# Patient Record
Sex: Female | Born: 1970 | Race: Black or African American | Hispanic: No | Marital: Single | Smoking: Current every day smoker
Health system: Southern US, Community
[De-identification: ages and names within clinical notes are randomized; demographics above are authoritative.]

## PROBLEM LIST (undated history)

## (undated) DIAGNOSIS — M62838 Other muscle spasm: Secondary | ICD-10-CM

## (undated) DIAGNOSIS — R112 Nausea with vomiting, unspecified: Secondary | ICD-10-CM

## (undated) DIAGNOSIS — I10 Essential (primary) hypertension: Secondary | ICD-10-CM

## (undated) DIAGNOSIS — M549 Dorsalgia, unspecified: Secondary | ICD-10-CM

## (undated) DIAGNOSIS — G709 Myoneural disorder, unspecified: Secondary | ICD-10-CM

## (undated) DIAGNOSIS — T8859XA Other complications of anesthesia, initial encounter: Secondary | ICD-10-CM

## (undated) DIAGNOSIS — M069 Rheumatoid arthritis, unspecified: Secondary | ICD-10-CM

## (undated) DIAGNOSIS — Z9889 Other specified postprocedural states: Secondary | ICD-10-CM

## (undated) DIAGNOSIS — G8929 Other chronic pain: Secondary | ICD-10-CM

## (undated) DIAGNOSIS — T4145XA Adverse effect of unspecified anesthetic, initial encounter: Secondary | ICD-10-CM

## (undated) HISTORY — PX: BACK SURGERY: SHX140

## (undated) SURGERY — ARTHROSCOPY, KNEE
Anesthesia: Choice | Site: Knee | Laterality: Right

---

## 1993-05-07 HISTORY — PX: TUBAL LIGATION: SHX77

## 2000-11-14 ENCOUNTER — Other Ambulatory Visit: Admission: RE | Admit: 2000-11-14 | Discharge: 2000-11-14 | Payer: Self-pay | Admitting: Orthopedic Surgery

## 2001-01-30 ENCOUNTER — Encounter: Payer: Self-pay | Admitting: *Deleted

## 2001-01-30 ENCOUNTER — Emergency Department (HOSPITAL_COMMUNITY): Admission: EM | Admit: 2001-01-30 | Discharge: 2001-01-30 | Payer: Self-pay | Admitting: *Deleted

## 2001-07-29 ENCOUNTER — Emergency Department (HOSPITAL_COMMUNITY): Admission: EM | Admit: 2001-07-29 | Discharge: 2001-07-29 | Payer: Self-pay | Admitting: Emergency Medicine

## 2001-07-30 ENCOUNTER — Encounter: Payer: Self-pay | Admitting: Internal Medicine

## 2001-07-30 ENCOUNTER — Ambulatory Visit (HOSPITAL_COMMUNITY): Admission: RE | Admit: 2001-07-30 | Discharge: 2001-07-30 | Payer: Self-pay | Admitting: Internal Medicine

## 2002-01-26 ENCOUNTER — Emergency Department (HOSPITAL_COMMUNITY): Admission: EM | Admit: 2002-01-26 | Discharge: 2002-01-26 | Payer: Self-pay | Admitting: *Deleted

## 2002-02-04 ENCOUNTER — Ambulatory Visit (HOSPITAL_COMMUNITY): Admission: RE | Admit: 2002-02-04 | Discharge: 2002-02-04 | Payer: Self-pay | Admitting: Orthopaedic Surgery

## 2002-02-04 ENCOUNTER — Encounter: Payer: Self-pay | Admitting: Orthopaedic Surgery

## 2003-03-18 ENCOUNTER — Emergency Department (HOSPITAL_COMMUNITY): Admission: EM | Admit: 2003-03-18 | Discharge: 2003-03-18 | Payer: Self-pay | Admitting: *Deleted

## 2006-05-07 HISTORY — PX: OTHER SURGICAL HISTORY: SHX169

## 2006-05-18 ENCOUNTER — Inpatient Hospital Stay (HOSPITAL_COMMUNITY): Admission: EM | Admit: 2006-05-18 | Discharge: 2006-05-21 | Payer: Self-pay | Admitting: Emergency Medicine

## 2006-05-28 ENCOUNTER — Emergency Department (HOSPITAL_COMMUNITY): Admission: EM | Admit: 2006-05-28 | Discharge: 2006-05-28 | Payer: Self-pay | Admitting: Emergency Medicine

## 2006-05-29 ENCOUNTER — Ambulatory Visit (HOSPITAL_COMMUNITY): Admission: RE | Admit: 2006-05-29 | Discharge: 2006-05-29 | Payer: Self-pay | Admitting: Orthopedic Surgery

## 2006-10-15 ENCOUNTER — Encounter (HOSPITAL_COMMUNITY): Admission: RE | Admit: 2006-10-15 | Discharge: 2006-11-14 | Payer: Self-pay | Admitting: Orthopedic Surgery

## 2007-05-15 ENCOUNTER — Ambulatory Visit (HOSPITAL_COMMUNITY): Admission: RE | Admit: 2007-05-15 | Discharge: 2007-05-15 | Payer: Self-pay | Admitting: Internal Medicine

## 2007-10-19 ENCOUNTER — Emergency Department (HOSPITAL_COMMUNITY): Admission: EM | Admit: 2007-10-19 | Discharge: 2007-10-19 | Payer: Self-pay | Admitting: Emergency Medicine

## 2008-03-25 ENCOUNTER — Emergency Department (HOSPITAL_COMMUNITY): Admission: EM | Admit: 2008-03-25 | Discharge: 2008-03-25 | Payer: Self-pay | Admitting: Emergency Medicine

## 2008-06-02 ENCOUNTER — Ambulatory Visit (HOSPITAL_COMMUNITY): Admission: RE | Admit: 2008-06-02 | Discharge: 2008-06-02 | Payer: Self-pay | Admitting: Internal Medicine

## 2008-08-25 ENCOUNTER — Ambulatory Visit: Payer: Self-pay | Admitting: Orthopedic Surgery

## 2008-08-25 DIAGNOSIS — M25569 Pain in unspecified knee: Secondary | ICD-10-CM | POA: Insufficient documentation

## 2008-08-30 ENCOUNTER — Encounter: Payer: Self-pay | Admitting: Orthopedic Surgery

## 2008-09-08 ENCOUNTER — Encounter (HOSPITAL_COMMUNITY): Admission: RE | Admit: 2008-09-08 | Discharge: 2008-10-08 | Payer: Self-pay | Admitting: Orthopedic Surgery

## 2008-10-19 ENCOUNTER — Encounter: Payer: Self-pay | Admitting: Orthopedic Surgery

## 2008-11-13 ENCOUNTER — Emergency Department (HOSPITAL_COMMUNITY): Admission: EM | Admit: 2008-11-13 | Discharge: 2008-11-13 | Payer: Self-pay | Admitting: Emergency Medicine

## 2009-05-04 ENCOUNTER — Ambulatory Visit: Payer: Self-pay | Admitting: Orthopedic Surgery

## 2009-05-04 DIAGNOSIS — M24469 Recurrent dislocation, unspecified knee: Secondary | ICD-10-CM

## 2009-05-11 ENCOUNTER — Telehealth: Payer: Self-pay | Admitting: Orthopedic Surgery

## 2009-05-12 ENCOUNTER — Ambulatory Visit: Payer: Self-pay | Admitting: Orthopedic Surgery

## 2009-06-02 ENCOUNTER — Ambulatory Visit: Payer: Self-pay | Admitting: Orthopedic Surgery

## 2009-06-16 ENCOUNTER — Ambulatory Visit: Payer: Self-pay | Admitting: Orthopedic Surgery

## 2009-06-22 ENCOUNTER — Telehealth: Payer: Self-pay | Admitting: Orthopedic Surgery

## 2009-06-24 ENCOUNTER — Encounter: Payer: Self-pay | Admitting: Orthopedic Surgery

## 2009-06-24 ENCOUNTER — Ambulatory Visit (HOSPITAL_COMMUNITY): Admission: RE | Admit: 2009-06-24 | Discharge: 2009-06-24 | Payer: Self-pay | Admitting: Orthopedic Surgery

## 2009-06-29 ENCOUNTER — Ambulatory Visit: Payer: Self-pay | Admitting: Orthopedic Surgery

## 2009-06-30 ENCOUNTER — Telehealth: Payer: Self-pay | Admitting: Orthopedic Surgery

## 2009-07-12 ENCOUNTER — Telehealth: Payer: Self-pay | Admitting: Orthopedic Surgery

## 2009-07-20 ENCOUNTER — Encounter: Payer: Self-pay | Admitting: Orthopedic Surgery

## 2009-07-26 ENCOUNTER — Encounter: Payer: Self-pay | Admitting: Orthopedic Surgery

## 2009-08-13 ENCOUNTER — Encounter: Payer: Self-pay | Admitting: Orthopedic Surgery

## 2009-09-01 ENCOUNTER — Encounter: Payer: Self-pay | Admitting: Orthopedic Surgery

## 2009-09-19 ENCOUNTER — Emergency Department (HOSPITAL_COMMUNITY): Admission: EM | Admit: 2009-09-19 | Discharge: 2009-09-19 | Payer: Self-pay | Admitting: Emergency Medicine

## 2010-04-02 ENCOUNTER — Encounter: Payer: Self-pay | Admitting: Orthopedic Surgery

## 2010-04-02 ENCOUNTER — Emergency Department (HOSPITAL_COMMUNITY): Admission: EM | Admit: 2010-04-02 | Discharge: 2010-04-02 | Payer: Self-pay | Admitting: Emergency Medicine

## 2010-04-04 ENCOUNTER — Ambulatory Visit: Payer: Self-pay | Admitting: Orthopedic Surgery

## 2010-04-04 DIAGNOSIS — M2500 Hemarthrosis, unspecified joint: Secondary | ICD-10-CM | POA: Insufficient documentation

## 2010-04-05 ENCOUNTER — Ambulatory Visit (HOSPITAL_COMMUNITY): Admission: RE | Admit: 2010-04-05 | Discharge: 2010-04-05 | Payer: Self-pay | Admitting: Orthopedic Surgery

## 2010-04-11 ENCOUNTER — Ambulatory Visit: Payer: Self-pay | Admitting: Orthopedic Surgery

## 2010-05-07 HISTORY — PX: KNEE SURGERY: SHX244

## 2010-06-08 NOTE — Progress Notes (Signed)
Summary: spoke with patient about insurance information request  Phone Note Outgoing Call   Call placed to: Patient Summary of Call: Called patient re: request from [employer] re: short term disability forms.  Patient had been advised previously of forms process and fee. She relates that [employer] should accept the records from our office.  I advised that she must sign an authorization to release. Initial call taken by: Cammie Sickle,  July 12, 2009 11:58 AM

## 2010-06-08 NOTE — Letter (Signed)
Summary: Out of Work  [facility]  [address]  [place], [place] [address]   Phone: [phone]  Fax: [phone]    May 12, 2009   Employee:  Karen Mercer    To Whom It May Concern:   For Medical reasons, please excuse the above named employee from work for the following dates:  As of appointment today, May 12, 2009:  continue out of work status as follows:   Start:   May 04, 2009  End:   June 02, 2009  Approximate Return to work:   June 06, 2009  If you need additional information, please feel free to contact our office.         Sincerely,    Terrance Mass, MD

## 2010-06-08 NOTE — Assessment & Plan Note (Signed)
Summary: [facility] ER FOL/UP/RT KNEE SWELLING/XRAY [facility] 04/02/10/BCBS/CAF   Visit Type:  est. patient recurrent problem  CC:  right knee pain.  History of Present Illness: I saw Karen Mercer in the office today for a followup visit.  She is a 40 years old woman with the complaint of:  right knee pain and swelling.  The patient went to the emergency room secondary to increased pain and swelling on November 26 she denies any injury.  She was started on Norco 5 mg and Naprosyn 500 mg with good result.  She is here today for evaluation of the RIGHT knee which I saw her for in February of this year at which time she was treated for patellar subluxation.  She was placed in a brace and improved with that.  Xrays [facility] right knee for review from 04/02/10.  Was given Norco 5mg  number 20 and Naprosyn 500mg  number 20 from er, meds help.  Increased swelling since 04/01/10, no injury.  an MRI was done back in February and it showed arthritis of the knee medial compartment with thinning of the patellofemoral cartilage, moderate size knee effusion but no meniscal or ligament tear.  In terms of catching locking and giving MWN:UUVO noted       Allergies (verified): No Known Drug Allergies  Past History:  Past Medical History: Last updated: 08/25/2008 NA  Past Surgical History: Last updated: 08/25/2008 right arm tubal ligation  Risk Factors: Alcohol Use: 0 (08/25/2008) Caffeine Use: 0 (08/25/2008)  Risk Factors: Smoking Status: current (08/25/2008) Packs/Day: 1.0 (08/25/2008)  Family history reviewed for relevance to current acute and chronic problems.  Family History: Reviewed history from 08/25/2008 and no changes required. Family History of Arthritis   Knee Exam  General:    Well-developed, well-nourished, normal body habitus; no deformities, normal grooming.  Gait:    limp noted-right.    Skin:    Intact, no scars, lesions, rashes, cafe au lait spots, or bruising.     Inspection:    swelling: RIGHT knee joint effusion grade 3  Palpation:    medial retinacular tenderness along the patellofemoral articulation  Vascular:    There was no swelling or varicose veins. The pulses and temperature are normal. There was no edema or tenderness.  Sensory:    Gross coordination and sensation were normal.    Motor:    Motor strength 5/5 bilaterally for quadriceps, hamstrings, ankle dorsiflexion, and ankle plantar flexion.    Reflexes:    deferred  Knee Exam:    Right:    Inspection/Palpation:  range of motion RIGHT knee is 5-70  ACL PCL stable  Alignment normal  Mild apprehension    Left:    Inspection:  Normal    Palpation:  Normal    Stability:  stable    Tenderness:  negative    Swelling:  none    range of motion normal   Impression & Recommendations:  Problem # 1:  HEMARTHROSIS (ICD-719.10) Assessment New  an x-ray was done of the RIGHT knee on November 27 for pain and swelling moderate-sized suprapatellar joint effusion with normal joint spaces and mild cystic changes posterior patella and the superior region no acute fracture is seen.  Also reviewed previous MRIs stated which is noted in the history of present illness  Verbal consent was obtained. 50 cc blood drawn from the knee  The RIGHT knee was prepped with alcohol and ethyl chloride. 1 cc of depomedrol 40mg /cc and 4 cc of lidocaine 1% was injected. there  were no complications.  Orders: Est. Patient Level IV ([address]11) Joint Aspirate / Injection, Large (20610) Depo- Medrol 40mg  (J1030)   Recommend MRI RIGHT knee: Indications acute hemarthrosis unknown cause, history of patellofemoral arthritis and patellofemoral subluxation.  Fat droplets were also noted in the aspirate  Patient Instructions: 1)  You have received an injection of cortisone today. You may experience increased pain at the injection site. Apply ice pack to the area for 20 minutes every 2 hours and take 2 xtra  strength tylenol every 8 hours. This increased pain will usually resolve in 24 hours. The injection will take effect in 3-10 days.  2)  Apply three times a day for 20 min  3)  Wear brace  4)  Fill medications  5)  Stay OOW x 2 weeks  6)  MRI right knee, return after mri    Orders Added: 1)  Est. Patient Level IV [91478] 2)  Joint Aspirate / Injection, Large [20610] 3)  Depo- Medrol 40mg  [J1030]

## 2010-06-08 NOTE — Progress Notes (Signed)
Summary: request from [employer] Note Other Incoming Call back at [employer] of Call: Rec'd call from [employer], [place], re: patient's out of work status.  We have no auth on file.  Patient had been given our standard disability form packet.  Advised insurer that upon receiving auth and faxed request, we will forward information. Initial call taken by: Cammie Sickle,  June 30, 2009 9:56 AM

## 2010-06-08 NOTE — Letter (Signed)
Summary: Out of Work  [facility]  [address]  [place], [place] [address]   Phone: [phone]  Fax: [phone]    April 04, 2010   Employee:  Karen Mercer    To Whom It May Concern:   For Medical reasons, please excuse the above named employee from work for the following dates:  Start:   04/04/10  End:   04/18/10 Or until further notice   If you need additional information, please feel free to contact our office.         Sincerely,    Terrance Mass, MD

## 2010-06-08 NOTE — Progress Notes (Signed)
Summary: pain in the knee  Phone Note Call from Patient Call back at Home Phone [phone] Call back at [phone]   Summary of Call: patient states that she is having alot of knee pain, was seen 05/04/09, Norco 5 is not working and she still feels like her knee is subluxating with the stabilizer brace on,  appt scheduled to come in 3 weeks after her last office visit, Ibuprofen is being used also   any suggestions Initial call taken by: Ether Griffins,  May 11, 2009 11:00 AM  Follow-up for Phone Call        we can place her in a long leg cast   let me know if she agrees and I can schedule it at a time when I have time to do it  Follow-up by: [name] MD,  May 11, 2009 11:06 AM  Additional Follow-up for Phone Call Additional follow up Details #1::        I advised patient, she said that she is in pain and would like to get [facility] if this was the only option, and I advised her that we would not be giving her any stronger pain medication and she said she understood. So when would you like for her to come in Additional Follow-up by: Ether Griffins,  May 11, 2009 11:50 AM

## 2010-06-08 NOTE — Assessment & Plan Note (Signed)
Summary: RE-CK/REVIEW MRI RESULTS [facility]/RT KNEE/BCBS/CAF   Visit Type:  Follow-up  CC:  mri results rt knee.  History of Present Illness: I saw Karen Mercer in the office today for a followup visit.  She is a 40 years old woman with the complaint of:  right knee pain and swelling.  The patient went to the emergency room secondary to increased pain and swelling on November 26 she denies any injury.  She was started on Norco 5 mg and Naprosyn 500 mg with good result.  She is here today for evaluation of the RIGHT knee which I saw her for in February of this year at which time she was treated for patellar subluxation.  She was placed in a brace and improved with that.  Xrays [facility] right knee  04/02/10.  Medications Norco 5mg , and Naprosyn 500mg  , no refill needed.  Treatment were aspiration and injection rest ice and bracing.  Today we will review the new MRI and reevaluate her she is still having pain at a level of 7/10    I  Allergies: No Known Drug Allergies  Physical Exam  Additional Exam:  GENERAL: Appearance is normal   CDV: normal pulse and temperature   Neuro: sensation was normal   She is still favoring the RIGHT leg with a limp she has a joint effusion 2+.  Her range of motion is 125 her knee is stable her strength is normal     Impression & Recommendations:  Problem # 1:  HEMARTHROSIS (ICD-719.10) Assessment Improved  MPRESSION:   1.  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 2.  No acute bony findings and intact ligamentous structures. Proximal anterior MCL sprain is noted along with MCL and pes anserinus bursitis. 3.  No meniscal tears. 4.  Moderate sized joint effusion and medial patellar plica.  She still has a RIGHT knee effusions I reaspirated today it was amber colored up of 40 cc and we reinjected the knee.    Verbal consent was obtained. The RIGHT knee was prepped with alcohol and ethyl chloride. 1  cc of depomedrol 40mg /cc and 4 cc of lidocaine 1% was injected. there were no complications.  Orders: Est. Patient Level III ([address]) Joint Aspirate / Injection, Large (20610) Depo- Medrol 40mg  (J1030)  Patient Instructions: 1)  You have received an injection of cortisone today. You may experience increased pain at the injection site. Apply ice pack to the area for 20 minutes every 2 hours and take 2 xtra strength tylenol every 8 hours. This increased pain will usually resolve in 24 hours. The injection will take effect in 3-10 days.  2)  Ice 3 x a day  3)  wear brace as needed  4)  return to work in January  5)  continue pain medicine and naprosyn  6)  no f/u scheduled    Orders Added: 1)  Est. Patient Level III [60454] 2)  Joint Aspirate / Injection, Large [20610] 3)  Depo- Medrol 40mg  [J1030]

## 2010-06-08 NOTE — Letter (Signed)
Summary: [employer] disability insurance forms  [employer] disability insurance forms   Imported By: Jacklynn Ganong 08/23/2009 10:28:14  _____________________________________________________________________  External Attachment:    Type:   Image     Comment:   External Document

## 2010-06-08 NOTE — Letter (Signed)
Summary: Out of Work  [facility]  [address]  [place], [place] [address]   Phone: [phone]  Fax: [phone]    June 02, 2009   Employee:  Karen Mercer    To Whom It May Concern:   For Medical reasons, please excuse the above named employee from work for the following dates:  Start:   June 02, 2009  End:   June 20, 2009  (next appointment June 20, 2009)  Estimated Return to work date:  June 21, 2009   If you need additional information, please feel free to contact our office.         Sincerely,    Terrance Mass, MD

## 2010-06-08 NOTE — Assessment & Plan Note (Signed)
Summary: 3 WK RE-CK RT KNEE IN CAST/BCBS/CAF   Visit Type:  Follow-up  CC:  right knee pain.Karen Mercer  History of Present Illness: status post subluxation of patella with spontaneous relocation.  She was treated with a knee patellar stabilizer. It did not work so we placed a long-leg cast.  Reevaluation today.  Medication Vicodin. ibuprofen 800.  examination reveals that she hasStiffness in the RIGHT knee, apprehension, has not been alleviated. There is no excessive subluxation of the RIGHT knee.  She is neurovascularly intact. Her skin is dry. Her muscle tone is modestly decreased.  Impression subluxation, RIGHT patella.  Plan hinged knee brace, home exercise program. Return in 2 weeks reassess work status    Allergies: No Known Drug Allergies  Past History:  Past Medical History: Last updated: 08/25/2008 NA  Past Surgical History: Last updated: 08/25/2008 right arm tubal ligation  Family History: Last updated: 08/25/2008 Family History of Arthritis  Social History: Last updated: 08/25/2008 Patient is single.  machine operator  Risk Factors: Alcohol Use: 0 (08/25/2008) Caffeine Use: 0 (08/25/2008)  Risk Factors: Smoking Status: current (08/25/2008) Packs/Day: 1.0 (08/25/2008)  Review of Systems Neuro:  Complains of unsteady walking; denies numbness. MS:  See HPI; Complains of joint pain.   Other Orders: Est. Patient Level III ([address])  Patient Instructions: 1)  Brace 0-60  2)  Knee exercises daily  3)  return in 2 weeks  Prescriptions: NORCO 5-325 MG TABS (HYDROCODONE-ACETAMINOPHEN) 1 by mouth q 4 as needed  #42 x 2   Entered and Authorized by:   [name] MD   Signed by:   [name] MD on 06/02/2009   Method used:   Print then Give to Patient   RxID:   [id number]

## 2010-06-08 NOTE — Letter (Signed)
Summary: [employer] disability forms  [employer] disability forms   Imported By: Cammie Sickle 08/11/2009 19:05:53  _____________________________________________________________________  External Attachment:    Type:   Image     Comment:   External Document

## 2010-06-08 NOTE — Letter (Signed)
Summary: *Orthopedic No Show Letter  [facility]  [address]  [place], [place] [address]   Phone: [phone]  Fax: [phone]     09/01/2009    Ms. Zarie Kosiba [address] [place], [place]  [address]   Dear Ms. Ty Hilts,   Our records indicate that you missed your scheduled appointment with Dr. Beaulah Corin on 08/31/09.  Please contact this office to reschedule your appointment as soon as possible.  It is important that you keep your scheduled appointments with your physician, so we can provide you the best care possible.  We have enclosed an appointment card for your convenience.      Sincerely,    Dr. Terrance Mass, MD [facility] Phone [phone]

## 2010-06-08 NOTE — Miscellaneous (Signed)
Summary: MRI appointment information  Clinical Lists Changes   Contacted insurer [employer] re: pre-cert for MRI RT knee.  CPT [id number] - spoke w/Dalia and received pre-auth # [id number].  MRI appt scheduled 04/05/10 at [facility], registration 6:30pm for 7:00pm appt. Called patient and confirmed.  Follow up appointment has been scheduled here for results.

## 2010-06-08 NOTE — Medication Information (Signed)
Summary: RX Folder immobilizer  RX Folder immobilizer   Imported By: Cammie Sickle 08/27/2009 12:01:17  _____________________________________________________________________  External Attachment:    Type:   Image     Comment:   External Document

## 2010-06-08 NOTE — Letter (Signed)
Summary: Out of Work  [facility]  [address]  [place], [place] [address]   Phone: [phone]  Fax: [phone]    June 16, 2009   Employee:  Eli Phillips Hackel    To Whom It May Concern:   For Medical reasons, please excuse the above named employee from work for the following dates:  Start:   June 16, 2009 - patient returned to office today for appointment   Please continue out of work status as follows:  Estimated End/Estimated Return to work date:   August 04, 2009   If you need additional information, please feel free to contact our office.         Sincerely,    Terrance Mass, MD

## 2010-06-08 NOTE — Letter (Signed)
Summary: *Orthopedic No Show Letter  [facility]  [address]  [place], [place] [address]   Phone: [phone]  Fax: [phone]     08/13/2009    Karen Mercer [address] [place], [place]  [address]    Dear Ms. Ty Hilts,   Our records indicate that you missed your scheduled appointment with Dr. Beaulah Corin on 08/11/09.  Please contact this office to reschedule your appointment as soon as possible.  It is important that you keep your scheduled appointments with your physician, so we can provide you the best care possible.  We have enclosed an appointment card for your convenience.      Sincerely,    Dr. Terrance Mass, MD [facility] Phone [phone]

## 2010-06-08 NOTE — Progress Notes (Signed)
Summary: MRI appointment  Phone Note Outgoing Call   Call placed by: Waldon Reining,  June 22, 2009 3:48 PM Call placed to: Patient Action Taken: Phone Call Completed, Appt scheduled Summary of Call: I called to give the patient her MRI appointment at [facility] on 06-24-09 at 9:30am. Patient has [employer], authorization [id number] and it expires on 07-20-09. Patient will follow up back here for results.

## 2010-06-08 NOTE — Letter (Signed)
Summary: Out of Work  [facility]  [address]  [place], [place] [address]   Phone: [phone]  Fax: [phone]    April 11, 2010   Employee:  Eli Phillips Schauer    To Whom It May Concern:   For Medical reasons, please excuse the above named employee from work for the following dates:  Start:   04/04/10  End/Return to work full duty no restrictions:    04/13/10  If you need additional information, please feel free to contact our office.         Sincerely,   Terrance Mass, MD

## 2010-06-08 NOTE — Assessment & Plan Note (Signed)
Summary: KNEE PAIN/APPLY LONG LEG CAST/[employer]/CAF   Visit Type:  Follow-up  CC:  knee pain .  History of Present Illness: I saw Karen Mercer in the office today for a followup visit.  She is a 40 years old woman with the complaint of:  DX: Right knee pain.  Treatment: Right knee patelaar stabilizer brace and Norco 5 and Ibuprofen 800mg .  Complaints: a lot of pain right knee.  Today, scheduled for: [facility] for the right leg.   Allergies: No Known Drug Allergies   Other Orders: Long Leg Cast ([address]1478)  Patient Instructions: 1)  cast x 3 weeks then cast off resume brace

## 2010-06-08 NOTE — Assessment & Plan Note (Signed)
Summary: knee is swelling/frs   Visit Type:  Follow-up  CC:  right knee swelling.  History of Present Illness: I saw Karen Mercer in the office today for a followup visit.  She is a 40 years old woman with the complaint of:  DX: right knee subluxation patella with spontaneous relocation.  Treatment: [facility], lateral stabilizer brace, post op brace.  MEDS:  Norco 5 and Ibuprofen 800mg  once a day, soem relief.  Complaints:  swelling and pain for 3 days, no injury.  Still alternates braces.  Out of work, return on the 15th of this month.  Pain is medial and lateral.           Allergies (verified): No Known Drug Allergies  Physical Exam  Additional Exam:  the exam shows that she has tenderness pain and swelling with a large joint effusion in the knee.  The ACL and PCL are intact the collateral ligaments are stable.  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  Orders: Est. Patient Level III ([address]) Joint Aspirate / Injection, Large (20610) Depo- Medrol 40mg  (J1030)  Patient Instructions: 1)  MRI RIGHT KNEE  2)  OOW EXTEND 2/15-3/31 3)  You have received an injection of cortisone today. You may experience increased pain at the injection site. Apply ice pack to the area for 20 minutes every 2 hours and take 2 xtra strength tylenol every 8 hours. This increased pain will usually resolve in 24 hours. The injection will take effect in 3-10 days.

## 2010-06-08 NOTE — Letter (Signed)
Summary: Out of Work  [facility]  [address]  [place], [place] [address]   Phone: [phone]  Fax: [phone]    June 29, 2009   Employee:  Karen Mercer    To Whom It May Concern:   For Medical reasons, please excuse the above named employee from work for the following dates:  Start:   June 29, 2009 - seen in our office for appointment today  End/Return to full duty work, no restrictions:  July 04, 2009   If you need additional information, please feel free to contact our office.         Sincerely,    Terrance Mass, MD

## 2010-06-08 NOTE — Assessment & Plan Note (Signed)
Summary: mri results from ap/frs   Visit Type:  Follow-up  CC:  mri results rt knee.  History of Present Illness: I saw Karen Mercer in the office today for a followup visit.  She is a 40 years old woman with the complaint of:  DX:  Right knee pain.  Image:  MRI [facility] 06/24/09 right knee.  Reading: IMPRESSION:   1.  Mild medial compartment degenerative chondrosis. 2.  Thinning of the patellar cartilage at the apex. 3.  Moderate sized knee joint effusion. 4.  No evidence of meniscal or ligamentous tear.  No acute osteochondral findings.  I REVIEWED WITH THE REPORT AND AGREE   Norco 5 for pain/ response fairly adeqyate relief   EXAMINATION THE KNEE HAS A MILD EFFUSION FROM  NO TENDERNESS NO APPREHENSION  NORMAL GAIT  NEURO VASCULAR EXAM IS NORMAL         Problems Prior to Update: 1)  Subluxation Patellar (MALALIGNMENT)  (ICD-718.36) 2)  Patello-femoral Syndrome  (ICD-719.46) 3)  Family History of Arthritis  (ICD-V17.7)  Current Medications (verified): 1)  Norco 5-325 Mg Tabs (Hydrocodone-Acetaminophen) .Marland Kitchen.. 1 By Mouth Q 4 As Needed  Allergies (verified): No Known Drug Allergies  Past History:  Past medical, surgical, family and social histories (including risk factors) reviewed, and no changes noted (except as noted below).  Past Medical History: Reviewed history from 08/25/2008 and no changes required. NA  Past Surgical History: Reviewed history from 08/25/2008 and no changes required. right arm tubal ligation  Family History: Reviewed history from 08/25/2008 and no changes required. Family History of Arthritis  Social History: Reviewed history from 08/25/2008 and no changes required. Patient is single.  machine operator  Review of Systems MS:  See HPI.   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  Patient Level III ([address]) Est. Patient Level III ([address])  Patient Instructions: 1)  Go back into slide on brace 2)  Patellafemoral subluxation. 3)  RTW full duty MOnday 07/04/09 4)  Come back in 6 week Prescriptions: NORCO 5-325 MG TABS (HYDROCODONE-ACETAMINOPHEN) 1 by mouth q 4 as needed  #42 x 2   Entered and Authorized by:   [name] MD   Signed by:   [name] MD on 06/29/2009   Method used:   Print then Give to Patient   RxID:   [id number] NORCO 5-325 MG TABS (HYDROCODONE-ACETAMINOPHEN) 1 by mouth q 4 as needed  #42 x 2   Entered and Authorized by:   [name] MD   Signed by:   [name] MD on 06/29/2009   Method used:   Historical   RxID:   [id number]

## 2010-06-08 NOTE — Letter (Signed)
Summary: Medical record fax [employer]  Medical record fax [employer]   Imported By: Cammie Sickle 07/26/2009 18:51:12  _____________________________________________________________________  External Attachment:    Type:   Image     Comment:   External Document

## 2010-07-03 ENCOUNTER — Encounter: Payer: Self-pay | Admitting: Orthopedic Surgery

## 2010-07-13 NOTE — Letter (Signed)
Summary: Out of Work  [facility]  [address]  [place], [place] [address]   Phone: [phone]  Fax: [phone]    July 03, 2010   Employee:  Karen Mercer    To Whom It May Concern:   For Medical reasons, please excuse the above named employee from work for the following dates:  Start:   07/03/10  End:   07/04/10   May return to work 07/05/10  If you need additional information, please feel free to contact our office.         Sincerely,    Dr. Wyline Copas, Montez Hageman.

## 2010-07-27 ENCOUNTER — Emergency Department (HOSPITAL_COMMUNITY)
Admission: EM | Admit: 2010-07-27 | Discharge: 2010-07-27 | Disposition: A | Discharge status: Home / Self Care | Payer: BC Managed Care – PPO | Attending: Emergency Medicine | Admitting: Emergency Medicine

## 2010-07-27 DIAGNOSIS — K029 Dental caries, unspecified: Secondary | ICD-10-CM | POA: Insufficient documentation

## 2010-07-27 DIAGNOSIS — K089 Disorder of teeth and supporting structures, unspecified: Secondary | ICD-10-CM | POA: Insufficient documentation

## 2010-08-18 ENCOUNTER — Encounter: Payer: Self-pay | Admitting: Orthopedic Surgery

## 2010-09-14 ENCOUNTER — Encounter: Payer: Self-pay | Admitting: Orthopedic Surgery

## 2010-09-14 ENCOUNTER — Ambulatory Visit (INDEPENDENT_AMBULATORY_CARE_PROVIDER_SITE_OTHER): Payer: BC Managed Care – PPO | Admitting: Orthopedic Surgery

## 2010-09-14 DIAGNOSIS — M25369 Other instability, unspecified knee: Secondary | ICD-10-CM | POA: Insufficient documentation

## 2010-09-14 DIAGNOSIS — M2390 Unspecified internal derangement of unspecified knee: Secondary | ICD-10-CM

## 2010-09-14 DIAGNOSIS — M25869 Other specified joint disorders, unspecified knee: Secondary | ICD-10-CM

## 2010-09-14 NOTE — Progress Notes (Signed)
40 year old female with complaints of recurrent knee pain and swelling with subluxating patella. The patient has had multiple subluxations and has had an MRI in the past, which shows no acute meniscal injury or ligament injury, but she does have recurrent episodes of subluxation.  Medical history no major medical problems.  She did have surgery on her RIGHT arm and she had a tubal ligation.  She reports caffeine use and alcohol use on occasion.  She does smoke.  Her family history is notable for arthritis.  ALLERGIES none known  Review of systems negative  Exam well-developed well-nourished normal body habitus no deformities normal grooming.  She ambulates with a limp she favors her RIGHT leg Upper extremities are normal to inspection palpation with normal range of motion absent of joint subluxation normal strength normal skin normal pulses and temperature no lymphadenopathy normal sensation  Inspection reveals reasonable alignment in both lower extremities.  She has full range of motion.  She is subluxable patella with positive apprehension the collateral ligaments and cruciate ligaments are stable muscle strength and tone is normal the skin is intact the pulses are good temperature is normal there is no edema here it no lymphadenopathy.   Sensation is normal  Previous MRI was done there was mild degenerative chondrosis of the patellar apex and also in the medial compartment no meniscal tears were cruciate ligament tears were noted she does have a history of a MCL sprain  Diagnosis recurrent subluxation RIGHT patella, patellofemoral malalignment.  Plan for arthroscopic evaluation of the joint followed by a lateral release arthroscopically open medial reefing and open tibial tubercle transfer with internal fixation    She will be out of work for 3 months.  Risk-benefit ratio of surgical versus non-surgical treatment has been explained. The patient wished to proceed with surgery.
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
## 2010-10-11 ENCOUNTER — Telehealth: Payer: Self-pay | Admitting: Orthopedic Surgery

## 2010-10-11 NOTE — Telephone Encounter (Signed)
Contacted Ph [phone], 4:55pm re: surgery scheduled at [facility] 10/16/10, left message automated system. Also entered online pre-cert.

## 2010-10-12 ENCOUNTER — Encounter: Payer: Self-pay | Admitting: Orthopedic Surgery

## 2010-10-12 ENCOUNTER — Encounter (HOSPITAL_COMMUNITY): Payer: BC Managed Care – PPO

## 2010-10-12 LAB — HEMOGLOBIN AND HEMATOCRIT, BLOOD
HCT: 33.7 % — ABNORMAL LOW (ref 36.0–46.0)
Hemoglobin: 11.4 g/dL — ABNORMAL LOW (ref 12.0–15.0)

## 2010-10-12 LAB — BASIC METABOLIC PANEL
BUN: 12 mg/dL (ref 6–23)
CO2: 26 mEq/L (ref 19–32)
Calcium: 9.8 mg/dL (ref 8.4–10.5)
Sodium: 139 mEq/L (ref 135–145)

## 2010-10-12 LAB — SURGICAL PCR SCREEN: Staphylococcus aureus: NEGATIVE

## 2010-10-12 NOTE — Telephone Encounter (Signed)
AddendumRudean Haskell care management rep, Charlcie Cradle, and received REF# [id number]; faxed clinicals as advised to Care Mgmt Department: fax [phone].

## 2010-10-12 NOTE — Progress Notes (Signed)
Karen Mercer  Description:  40 year old female  09/14/2010 10:15 AM Office Visit Provider:  [name], MD  MRN: [id number] Department:  [facility]  Updated October 12, 2010          Diagnoses  Reason for Visit    Patellar malalignment syndrome - Primary  Joint Swelling   717.9  right knee pain and swelling, had MRI right knee 2011, had aspirations and injections in the past, last injection was 04/11/10, has a brace, was given Norco 5 and Naprosyn last year for this problem, no new inuries, pain level is 7 today, swelling for a month, c/o popping noise.   Patellar instability      719.86                 History & Physical     [name], MD   40 year old female with complaints of recurrent knee pain and swelling with subluxating patella. The patient has had multiple subluxations and has had an MRI in the past, which shows no acute meniscal injury or ligament injury, but she does have recurrent episodes of subluxation.   Medical history no major medical problems. She did have surgery on her RIGHT arm and she had a tubal ligation. She reports caffeine use and alcohol use on occasion. She does smoke. Her family history is notable for arthritis.   ALLERGIES none known   Review of systems negative   Exam well-developed well-nourished normal body habitus no deformities normal grooming. She ambulates with a limp she favors her RIGHT leg   Upper extremities are normal to inspection palpation with normal range of motion absent of joint subluxation normal strength normal skin normal pulses and temperature no lymphadenopathy normal sensation   Inspection reveals reasonable alignment in both lower extremities. She has full range of motion. She has subluxable patella with positive apprehension; the collateral ligaments and cruciate ligaments are stable muscle strength and tone is normal the skin is intact the pulses are good temperature is normal there is no edema here it  no lymphadenopathy.   Sensation is normal   Previous MRI was done there was mild degenerative chondrosis of the patellar apex and also in the medial compartment no meniscal tears were cruciate ligament tears were noted she does have a history of a MCL sprain   Diagnosis recurrent subluxation RIGHT patella, patellofemoral malalignment.   Plan for arthroscopic evaluation of the joint followed by a lateral release arthroscopically open medial reefing and open tibial tubercle transfer with internal fixation   She will be out of work for 3 months.   Risk-benefit ratio of surgical versus non-surgical treatment has been explained. The patient wished to proceed with surgery.

## 2010-10-13 ENCOUNTER — Telehealth: Payer: Self-pay | Admitting: Orthopedic Surgery

## 2010-10-13 NOTE — Telephone Encounter (Signed)
Human resources officer Tiffany returned call, ph [phone]; states request for pre-authorization approved for 48-hour observation stay re: 10/16/10 surgery scheduled at [facility]. She states if needed to be converted to in-patient after 48 hours, hospital is to notify them re: related clinical information. (Same Ref/notification # [id number])

## 2010-10-16 ENCOUNTER — Ambulatory Visit (HOSPITAL_COMMUNITY)
Admission: RE | Admit: 2010-10-16 | Discharge: 2010-10-16 | Disposition: A | Discharge status: Home / Self Care | Payer: BC Managed Care – PPO | Source: Ambulatory Visit | Attending: Orthopedic Surgery | Admitting: Orthopedic Surgery

## 2010-10-16 DIAGNOSIS — Z01812 Encounter for preprocedural laboratory examination: Secondary | ICD-10-CM | POA: Insufficient documentation

## 2010-10-16 DIAGNOSIS — M24469 Recurrent dislocation, unspecified knee: Secondary | ICD-10-CM | POA: Insufficient documentation

## 2010-10-16 DIAGNOSIS — Z01818 Encounter for other preprocedural examination: Secondary | ICD-10-CM | POA: Insufficient documentation

## 2010-10-17 NOTE — Telephone Encounter (Signed)
10/16/10 - Per call back from  [facility], nurse reviewer, Tiffany ,ph [phone];  states request for pre-authorization approved for 48-hour observation stay re: 10/16/10 surgery scheduled at [facility]. She states if needed to be converted to in-patient after 48 hours, hospital is to notify them re: related clinical information. (Same Ref/notification # [id number])  (see separate note)

## 2010-10-19 ENCOUNTER — Ambulatory Visit (INDEPENDENT_AMBULATORY_CARE_PROVIDER_SITE_OTHER): Payer: BC Managed Care – PPO | Admitting: Orthopedic Surgery

## 2010-10-19 DIAGNOSIS — M24469 Recurrent dislocation, unspecified knee: Secondary | ICD-10-CM

## 2010-10-19 DIAGNOSIS — M25569 Pain in unspecified knee: Secondary | ICD-10-CM

## 2010-10-19 DIAGNOSIS — M2201 Recurrent dislocation of patella, right knee: Secondary | ICD-10-CM | POA: Insufficient documentation

## 2010-10-19 MED ORDER — HYDROCODONE-ACETAMINOPHEN 10-325 MG PO TABS: 1.0000 | ORAL_TABLET | ORAL | Status: DC | PRN

## 2010-10-19 NOTE — Progress Notes (Signed)
Postoperative visit #1.  Postop day #3 Surgery date June 11  status post proximal patellar realignment with arthroscopic lateral release, arthroscopic medial reefing, chondroplasty, medial patella.  Incisions look good. She has a mild amount of swelling.  Recommend increased pain medication to 10 mg of Norco.  Start physical therapy.  Straight leg brace for 3 weeks full weightbearing.  Come back in 2 weeks for recheck.

## 2010-10-19 NOTE — Patient Instructions (Signed)
Start weightbearing on the leg  Go for Physical therapy  Ice knee as needed  Take pain meds as needed Ibuprofen every 8 hrs  Come back in 2 weeks

## 2010-11-02 ENCOUNTER — Ambulatory Visit (INDEPENDENT_AMBULATORY_CARE_PROVIDER_SITE_OTHER): Payer: BC Managed Care – PPO | Admitting: Orthopedic Surgery

## 2010-11-02 DIAGNOSIS — M2390 Unspecified internal derangement of unspecified knee: Secondary | ICD-10-CM

## 2010-11-02 NOTE — Progress Notes (Signed)
Lateral release and medial reefing RIGHT knee  Postoperative  Surgery postop day #17  Therapy at [facility] to be doing well mild knee effusion nothing more than expected  Continue immobilizer and therapy followup with me in 3 weeks

## 2010-11-20 NOTE — Op Note (Signed)
NAMELASHAWNE, DURA NO.:  [id number]  MEDICAL RECORD NO.:  [id number]  LOCATION:  DAYP                          FACILITY:  [facility]  PHYSICIAN:  [name], M.D.DATE OF BIRTH:  03-10-1971  DATE OF PROCEDURE:  10/16/2010 DATE OF DISCHARGE:                              OPERATIVE REPORT   HISTORY:  A 40 year old female with multiple subluxations and dislocation of the right patella.  History of ligament laxity presented with pain and recurrent subluxations and was counseled on treatment options and opted for surgical versus continued nonoperative treatment therapy and bracing.  PREOPERATIVE DIAGNOSIS:  Subluxating right patella  POSTOPERATIVE DIAGNOSIS:  Subluxating right patella.  PROCEDURE:  Arthroscopy right knee, chondroplasty medial facet of the patella, lateral release, and medial reefing.  Exam under anesthesia.  SURGEON:  [name], MD  ASSISTANT:  Pierson Nation  ANESTHESIA:  General.  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  There was grade 2 chondromalacia of the medial femoral condyle.  There was grade 1 chondromalacia of the trochlea.  There was mild lateral facet overhang/subluxation and laxity of the medial patellofemoral ligament secondary to ligament laxity.  After site marking in the preop area of the right knee, the chart was updated.  The patient was taken to the operating room for general anesthesia, and she was given 1 g of Ancef.  At that point, we did an exam under anesthesia the ACL and PCL were intact.  Collateral ligaments were stable.  There was no obvious subluxation on range of motion of the knee.  However, the patella was hypermobile in each direction.  The Q- angle was normal.  She had hyperextension of the knee of approximately 15 degrees under anesthesia.  The leg was then prepped and draped with sterile technique.  Time-out procedure was completed.  A lateral portal was  established.  The scope was placed into the joint. Diagnostic arthroscopy was performed, noted grade 2 medial femoral chondromalacia.  There was fraying of the root of the medial meniscus. There was normal lateral compartment including meniscus.  ACL and PCL were intact.  The scope was placed into the joint into the patellofemoral area and chondromalacia was noted of the medial facet grade 3.  With the combination of mild lateral facet overhangs slash subluxation and laxity of the medial patellofemoral ligament.  I decided at that point not to realign the tibial tubercle as this would shift stress onto the medial facet which had greater than grade 2 chondromalacia so at that point we took the knee through a range of motion and 0, 30, 60, and 90 degrees of flexion.  The patella engaged at 30 degrees of knee flexion stating gauge throughout the flexion arc.  We debrided the medial chondromalacia on the patella.  We debrided the abnormality of the root of the medial meniscus with an ArthroCare wand. The chondroplasty was performed with a motorized shaver.  We then performed a lateral release.  We then did an arthroscopically assisted medial reefing using PDS suture outside-in technique.  We then made an incision along the four sutures that were placed and tied the sutures, tightening up the medial structures.  We then put the scope back into the joint and a lateral overhang was corrected.  We took the knee back through a range of motion and the knee continued to track normally in terms of the patella.  We irrigated the joint and took the scope out of the joint and then closed the medial incision with 0 Monocryl, 2-0 Monocryl in a running subcuticular fashion and applied Steri-Strips.  We injected the joint with 45 mL of 0.5% Marcaine with epinephrine.  We used 3-0 nylon to close the portals.  There were no specimens.  No complications.  Blood loss was zero.  The patient went to PACU  in good condition.  The patient will be discharged home full weightbearing.  She will use cryotherapy.  When she comes to the office, we will do a dressing change and instructed on range of motion exercises.  I gave her a straight knee immobilizer to ambulate with.     [name], M.D.     SEH/MEDQ  D:  10/16/2010  T:  10/17/2010  Job:  [id number]  Electronically Signed by [name] M.D. on 11/20/2010 05:50:05 PM

## 2010-11-20 NOTE — Op Note (Signed)
  NAMEFEDORA, KNISELY NO.:  [id number]  MEDICAL RECORD NO.:  [id number]  LOCATION:  DAYP                          FACILITY:  [facility]  PHYSICIAN:  [name], M.D.DATE OF BIRTH:  05/03/1971  DATE OF PROCEDURE: DATE OF DISCHARGE:                              OPERATIVE REPORT   ADDENDUM  DISCHARGE MEDICATIONS: 1. Norco 10/325 one q.4 p.r.n. for pain #84 one refill. 2. Ibuprofen 800 mg q.8 h. #90 one refill.     [name], M.D.     SEH/MEDQ  D:  10/16/2010  T:  10/17/2010  Job:  [id number]  Electronically Signed by [name] M.D. on 11/20/2010 05:50:09 PM

## 2010-11-21 ENCOUNTER — Other Ambulatory Visit: Payer: Self-pay | Admitting: *Deleted

## 2010-11-21 DIAGNOSIS — M25569 Pain in unspecified knee: Secondary | ICD-10-CM

## 2010-11-21 MED ORDER — HYDROCODONE-ACETAMINOPHEN 7.5-325 MG PO TABS: 1.0000 | ORAL_TABLET | ORAL | Status: AC | PRN

## 2010-11-28 ENCOUNTER — Ambulatory Visit (INDEPENDENT_AMBULATORY_CARE_PROVIDER_SITE_OTHER): Payer: BC Managed Care – PPO | Admitting: Orthopedic Surgery

## 2010-11-28 DIAGNOSIS — M2540 Effusion, unspecified joint: Secondary | ICD-10-CM

## 2010-11-28 MED ORDER — METHYLPREDNISOLONE ACETATE 40 MG/ML IJ SUSP: 40.0000 mg | Freq: Once | INTRAMUSCULAR | Status: DC

## 2010-11-28 NOTE — Progress Notes (Signed)
  This is a six-week followup status post arthroscopy RIGHT knee arthroscopic medial reefing and lateral release  Seems to be doing well having good days and bad days but does have knee effusion, knee flexion limited to 100  Recommend aspiration and injection of knee.  That was completed  Then we placed her in a patellar stabilizer brace she is to continue therapy and come back in 6 weeks  Separate procedure  Aspiration injection RIGHT knee  Verbal consent was obtained   Time out completed     Under sterile conditions the right knee was First aspirated approximately 30 cc of clear yellow fluid then injected with Depomedrol 40 mg / ml (1 ml) and lidocaine 1% (4 ml)  There were no complications

## 2010-12-18 ENCOUNTER — Encounter: Payer: Self-pay | Admitting: Orthopedic Surgery

## 2010-12-18 ENCOUNTER — Telehealth: Payer: Self-pay | Admitting: Orthopedic Surgery

## 2010-12-18 NOTE — Telephone Encounter (Signed)
YES WE CAN   SHE SHOULD WEAR A KNEE SLEEVE   RELEASE APPROVED

## 2010-12-18 NOTE — Telephone Encounter (Signed)
Called the patient to pick up note. °

## 2010-12-18 NOTE — Telephone Encounter (Signed)
Karen Mercer is not scheduled to come back to see you until 01/09/11, but she says she is ready to go back to work now. She is asking if you can release her to go back with restriction of "No Climbing" before she comes for that appointment.  If not, can we move that appointment up sooner? Please advise

## 2011-01-05 ENCOUNTER — Other Ambulatory Visit: Payer: Self-pay | Admitting: *Deleted

## 2011-01-05 MED ORDER — HYDROCODONE-ACETAMINOPHEN 5-325 MG PO TABS: 1.0000 | ORAL_TABLET | ORAL | Status: DC | PRN

## 2011-01-09 ENCOUNTER — Encounter: Payer: Self-pay | Admitting: Orthopedic Surgery

## 2011-01-09 ENCOUNTER — Ambulatory Visit: Payer: BC Managed Care – PPO | Admitting: Orthopedic Surgery

## 2011-04-03 ENCOUNTER — Other Ambulatory Visit (HOSPITAL_COMMUNITY): Payer: Self-pay | Admitting: Internal Medicine

## 2011-04-03 ENCOUNTER — Ambulatory Visit (HOSPITAL_COMMUNITY)
Admission: RE | Admit: 2011-04-03 | Discharge: 2011-04-03 | Disposition: A | Discharge status: Home / Self Care | Payer: BC Managed Care – PPO | Source: Ambulatory Visit | Attending: Internal Medicine | Admitting: Internal Medicine

## 2011-04-03 DIAGNOSIS — M25559 Pain in unspecified hip: Secondary | ICD-10-CM | POA: Insufficient documentation

## 2011-04-03 DIAGNOSIS — M545 Low back pain, unspecified: Secondary | ICD-10-CM | POA: Insufficient documentation

## 2011-04-03 DIAGNOSIS — M549 Dorsalgia, unspecified: Secondary | ICD-10-CM

## 2011-04-03 DIAGNOSIS — M79609 Pain in unspecified limb: Secondary | ICD-10-CM | POA: Insufficient documentation

## 2011-05-24 ENCOUNTER — Other Ambulatory Visit: Payer: Self-pay | Admitting: *Deleted

## 2011-05-24 MED ORDER — HYDROCODONE-ACETAMINOPHEN 5-325 MG PO TABS: 1.0000 | ORAL_TABLET | ORAL | Status: DC | PRN

## 2011-06-07 ENCOUNTER — Ambulatory Visit (INDEPENDENT_AMBULATORY_CARE_PROVIDER_SITE_OTHER): Payer: BC Managed Care – PPO | Admitting: Orthopedic Surgery

## 2011-06-07 ENCOUNTER — Encounter: Payer: Self-pay | Admitting: Orthopedic Surgery

## 2011-06-07 DIAGNOSIS — M2390 Unspecified internal derangement of unspecified knee: Secondary | ICD-10-CM

## 2011-06-07 DIAGNOSIS — M25369 Other instability, unspecified knee: Secondary | ICD-10-CM

## 2011-06-07 DIAGNOSIS — M25869 Other specified joint disorders, unspecified knee: Secondary | ICD-10-CM

## 2011-06-07 DIAGNOSIS — M25569 Pain in unspecified knee: Secondary | ICD-10-CM

## 2011-06-07 DIAGNOSIS — M2201 Recurrent dislocation of patella, right knee: Secondary | ICD-10-CM

## 2011-06-07 DIAGNOSIS — M24469 Recurrent dislocation, unspecified knee: Secondary | ICD-10-CM

## 2011-06-07 DIAGNOSIS — M25469 Effusion, unspecified knee: Secondary | ICD-10-CM

## 2011-06-07 NOTE — Progress Notes (Signed)
Patient ID: Karen Mercer, female   DOB: 1970-09-26, 41 y.o.   MRN: [id number]  Established patient  Recurrent problem.  RIGHT knee pain and swelling.  Status post arthroscopy, RIGHT knee on October 16, 2010 at that time had medial reefing and lateral release.  Started having pain and swelling again in the RIGHT knee with throbbing, aching pain. She was in a knee stabilizer brace for the patella. When the swelling started. She can no longer wear.  Review of systems no giving way episodes.  Exam the RIGHT knee has a joint effusion. Mild to moderate. Knee flexion is 110 and extension is full. The patella is hypermobile. There is apprehension. Collateral ligaments are stable. Cruciate ligaments are stable. Muscle tone is normal. Neurovascular exam normal.  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  Recommend aspiration injection.  Short course of steroids to control inflammation and hydrocodone for pain.  Aspiration RIGHT knee Verbal consent and timeout were completed for a RIGHT knee aspiration and injection   Under sterile conditions the RIGHT knee was aspirated from a lateral approach.  Findings were clear yellow fluid 20cc   F/B injection 40 mg of Depo-Medrol and 1% lidocaine 4 cc. A sterile dressing was applied there were no complications

## 2011-06-07 NOTE — Patient Instructions (Signed)
You have received a steroid shot. 15% of patients experience increased pain at the injection site with in the next 24 hours. This is best treated with ice and tylenol extra strength 2 tabs every 8 hours. If you are still having pain please call the office.    

## 2011-07-03 ENCOUNTER — Other Ambulatory Visit: Payer: Self-pay | Admitting: *Deleted

## 2011-07-03 MED ORDER — HYDROCODONE-ACETAMINOPHEN 5-325 MG PO TABS: 1.0000 | ORAL_TABLET | ORAL | Status: DC | PRN

## 2011-07-12 ENCOUNTER — Ambulatory Visit (INDEPENDENT_AMBULATORY_CARE_PROVIDER_SITE_OTHER): Payer: BC Managed Care – PPO | Admitting: Orthopedic Surgery

## 2011-07-12 ENCOUNTER — Encounter: Payer: Self-pay | Admitting: Orthopedic Surgery

## 2011-07-12 VITALS — BP 120/80 | Ht 62.0 in | Wt 147.0 lb

## 2011-07-12 DIAGNOSIS — M238X9 Other internal derangements of unspecified knee: Secondary | ICD-10-CM

## 2011-07-12 DIAGNOSIS — M25369 Other instability, unspecified knee: Secondary | ICD-10-CM | POA: Insufficient documentation

## 2011-07-12 NOTE — Progress Notes (Signed)
Patient ID: Karen Mercer, female   DOB: [birth year], 41 y.o.   MRN: [id number] Chief Complaint  Patient presents with  . Follow-up    Right knee pain.     The patient is status post RIGHT knee proximal patella femoral realignment  She complains of recurrent effusions I aspirated her knee several times she has chronic pain in the RIGHT knee as well.  The knee is also unstable giving out on several episodes causing her to fall and have lack of confidence in her knee.  She participated well in the postoperative rehabilitation program and was able to regain gainful employment but is no longer able to function in a normal manner  Examination reveals that her ambulation is affected by a limp favoring the RIGHT lower extremity she has a large joint effusion.  Range of motion is painful for her at 90 of flexion of -110.  She is resistant at that point.  Strength  Test is normal.  Patella is unstable.  Patella instability  Recommendation MRI of the knee.  I suspect she is going to need a distal realignment although when I scoped her knee her patella tracking did not suggest that

## 2011-07-17 ENCOUNTER — Other Ambulatory Visit (HOSPITAL_COMMUNITY): Payer: BC Managed Care – PPO

## 2011-07-17 ENCOUNTER — Telehealth: Payer: Self-pay | Admitting: Radiology

## 2011-07-17 NOTE — Telephone Encounter (Signed)
Patient has an MRI appointment at [facility] on 07-18-11 at 8:00. Patient has [employer], authorization [id number] and it expires on 08-14-11. Patient will follow up back here for her results.

## 2011-07-18 ENCOUNTER — Ambulatory Visit (HOSPITAL_COMMUNITY)
Admission: RE | Admit: 2011-07-18 | Discharge: 2011-07-18 | Disposition: A | Discharge status: Home / Self Care | Payer: BC Managed Care – PPO | Source: Ambulatory Visit | Attending: Orthopedic Surgery | Admitting: Orthopedic Surgery

## 2011-07-18 DIAGNOSIS — M25569 Pain in unspecified knee: Secondary | ICD-10-CM | POA: Insufficient documentation

## 2011-07-18 DIAGNOSIS — R937 Abnormal findings on diagnostic imaging of other parts of musculoskeletal system: Secondary | ICD-10-CM | POA: Insufficient documentation

## 2011-07-18 DIAGNOSIS — M2240 Chondromalacia patellae, unspecified knee: Secondary | ICD-10-CM | POA: Insufficient documentation

## 2011-07-18 DIAGNOSIS — M25369 Other instability, unspecified knee: Secondary | ICD-10-CM

## 2011-07-20 ENCOUNTER — Other Ambulatory Visit: Payer: Self-pay | Admitting: *Deleted

## 2011-07-20 MED ORDER — HYDROCODONE-ACETAMINOPHEN 7.5-325 MG PO TABS: 1.0000 | ORAL_TABLET | ORAL | Status: AC | PRN

## 2011-07-24 ENCOUNTER — Encounter: Payer: Self-pay | Admitting: Orthopedic Surgery

## 2011-07-24 ENCOUNTER — Ambulatory Visit (INDEPENDENT_AMBULATORY_CARE_PROVIDER_SITE_OTHER): Payer: BC Managed Care – PPO | Admitting: Orthopedic Surgery

## 2011-07-24 VITALS — BP 130/80 | Ht 62.0 in | Wt 147.0 lb

## 2011-07-24 DIAGNOSIS — M24469 Recurrent dislocation, unspecified knee: Secondary | ICD-10-CM

## 2011-07-24 DIAGNOSIS — M2201 Recurrent dislocation of patella, right knee: Secondary | ICD-10-CM

## 2011-07-24 DIAGNOSIS — M25869 Other specified joint disorders, unspecified knee: Secondary | ICD-10-CM

## 2011-07-24 DIAGNOSIS — M25369 Other instability, unspecified knee: Secondary | ICD-10-CM

## 2011-07-24 MED ORDER — HYDROCODONE-ACETAMINOPHEN 5-325 MG PO TABS: 1.0000 | ORAL_TABLET | ORAL | Status: DC | PRN

## 2011-07-24 NOTE — Patient Instructions (Signed)
Call us back to schedule surgery

## 2011-07-24 NOTE — Progress Notes (Signed)
Patient ID: Karen Mercer, female   DOB: [birth year], 41 y.o.   MRN: [id number] Chief Complaint  Patient presents with  . 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  3 or 4 months out of work required.

## 2011-07-25 ENCOUNTER — Telehealth: Payer: Self-pay | Admitting: *Deleted

## 2011-07-25 NOTE — Telephone Encounter (Signed)
Ill set this up with u tomorrow its more than a sark

## 2011-07-26 ENCOUNTER — Other Ambulatory Visit: Payer: Self-pay | Admitting: *Deleted

## 2011-07-26 NOTE — Telephone Encounter (Signed)
Orders entered

## 2011-07-26 NOTE — Telephone Encounter (Signed)
sark + tibial tubercle transfer   Special needs arthrex and large frag set with ligament washer

## 2011-07-27 ENCOUNTER — Emergency Department (HOSPITAL_COMMUNITY): Payer: BC Managed Care – PPO

## 2011-07-27 ENCOUNTER — Encounter (HOSPITAL_COMMUNITY): Payer: Self-pay | Admitting: *Deleted

## 2011-07-27 ENCOUNTER — Emergency Department (HOSPITAL_COMMUNITY)
Admission: EM | Admit: 2011-07-27 | Discharge: 2011-07-27 | Disposition: A | Discharge status: Home / Self Care | Payer: BC Managed Care – PPO | Attending: Emergency Medicine | Admitting: Emergency Medicine

## 2011-07-27 DIAGNOSIS — M25569 Pain in unspecified knee: Secondary | ICD-10-CM | POA: Insufficient documentation

## 2011-07-27 DIAGNOSIS — F17200 Nicotine dependence, unspecified, uncomplicated: Secondary | ICD-10-CM | POA: Insufficient documentation

## 2011-07-27 MED ORDER — OXYCODONE-ACETAMINOPHEN 5-325 MG PO TABS
1.0000 | ORAL_TABLET | Freq: Once | ORAL | Status: AC
  Administered 2011-07-27: 1 via ORAL
  Filled 2011-07-27: qty 1

## 2011-07-27 MED ORDER — OXYCODONE-ACETAMINOPHEN 5-325 MG PO TABS: 1.0000 | ORAL_TABLET | ORAL | Status: AC | PRN

## 2011-07-27 NOTE — Discharge Instructions (Signed)
Knee Pain The knee is the complex joint between your thigh and your lower leg. It is made up of bones, tendons, ligaments, and cartilage. The bones that make up the knee are:  The femur in the thigh.   The tibia and fibula in the lower leg.   The patella or kneecap riding in the groove on the lower femur.  CAUSES  Knee pain is a common complaint with many causes. A few of these causes are:  Injury, such as:   A ruptured ligament or tendon injury.   Torn cartilage.   Medical conditions, such as:   Gout   Arthritis   Infections   Overuse, over training or overdoing a physical activity.  Knee pain can be minor or severe. Knee pain can accompany debilitating injury. Minor knee problems often respond well to self-care measures or get well on their own. More serious injuries may need medical intervention or even surgery. SYMPTOMS The knee is complex. Symptoms of knee problems can vary widely. Some of the problems are:  Pain with movement and weight bearing.   Swelling and tenderness.   Buckling of the knee.   Inability to straighten or extend your knee.   Your knee locks and you cannot straighten it.   Warmth and redness with pain and fever.   Deformity or dislocation of the kneecap.  DIAGNOSIS  Determining what is wrong may be very straight forward such as when there is an injury. It can also be challenging because of the complexity of the knee. Tests to make a diagnosis may include:  Your caregiver taking a history and doing a physical exam.   Routine X-rays can be used to rule out other problems. X-rays will not reveal a cartilage tear. Some injuries of the knee can be diagnosed by:   Arthroscopy a surgical technique by which a small video camera is inserted through tiny incisions on the sides of the knee. This procedure is used to examine and repair internal knee joint problems. Tiny instruments can be used during arthroscopy to repair the torn knee cartilage  (meniscus).   Arthrography is a radiology technique. A contrast liquid is directly injected into the knee joint. Internal structures of the knee joint then become visible on X-ray film.   An MRI scan is a non x-ray radiology procedure in which magnetic fields and a computer produce two- or three-dimensional images of the inside of the knee. Cartilage tears are often visible using an MRI scanner. MRI scans have largely replaced arthrography in diagnosing cartilage tears of the knee.   Blood work.   Examination of the fluid that helps to lubricate the knee joint (synovial fluid). This is done by taking a sample out using a needle and a syringe.  TREATMENT The treatment of knee problems depends on the cause. Some of these treatments are:  Depending on the injury, proper casting, splinting, surgery or physical therapy care will be needed.   Give yourself adequate recovery time. Do not overuse your joints. If you begin to get sore during workout routines, back off. Slow down or do fewer repetitions.   For repetitive activities such as cycling or running, maintain your strength and nutrition.   Alternate muscle groups. For example if you are a weight lifter, work the upper body on one day and the lower body the next.   Either tight or weak muscles do not give the proper support for your knee. Tight or weak muscles do not absorb the stress placed   on the knee joint. Keep the muscles surrounding the knee strong.   Take care of mechanical problems.   If you have flat feet, orthotics or special shoes may help. See your caregiver if you need help.   Arch supports, sometimes with wedges on the inner or outer aspect of the heel, can help. These can shift pressure away from the side of the knee most bothered by osteoarthritis.   A brace called an "unloader" brace also may be used to help ease the pressure on the most arthritic side of the knee.   If your caregiver has prescribed crutches, braces,  wraps or ice, use as directed. The acronym for this is PRICE. This means protection, rest, ice, compression and elevation.   Nonsteroidal anti-inflammatory drugs (NSAID's), can help relieve pain. But if taken immediately after an injury, they may actually increase swelling. Take NSAID's with food in your stomach. Stop them if you develop stomach problems. Do not take these if you have a history of ulcers, stomach pain or bleeding from the bowel. Do not take without your caregiver's approval if you have problems with fluid retention, heart failure, or kidney problems.   For ongoing knee problems, physical therapy may be helpful.   Glucosamine and chondroitin are over-the-counter dietary supplements. Both may help relieve the pain of osteoarthritis in the knee. These medicines are different from the usual anti-inflammatory drugs. Glucosamine may decrease the rate of cartilage destruction.   Injections of a corticosteroid drug into your knee joint may help reduce the symptoms of an arthritis flare-up. They may provide pain relief that lasts a few months. You may have to wait a few months between injections. The injections do have a small increased risk of infection, water retention and elevated blood sugar levels.   Hyaluronic acid injected into damaged joints may ease pain and provide lubrication. These injections may work by reducing inflammation. A series of shots may give relief for as long as 6 months.   Topical painkillers. Applying certain ointments to your skin may help relieve the pain and stiffness of osteoarthritis. Ask your pharmacist for suggestions. Many over the-counter products are approved for temporary relief of arthritis pain.   In some countries, doctors often prescribe topical NSAID's for relief of chronic conditions such as arthritis and tendinitis. A review of treatment with NSAID creams found that they worked as well as oral medications but without the serious side effects.    PREVENTION  Maintain a healthy weight. Extra pounds put more strain on your joints.   Get strong, stay limber. Weak muscles are a common cause of knee injuries. Stretching is important. Include flexibility exercises in your workouts.   Be smart about exercise. If you have osteoarthritis, chronic knee pain or recurring injuries, you may need to change the way you exercise. This does not mean you have to stop being active. If your knees ache after jogging or playing basketball, consider switching to swimming, water aerobics or other low-impact activities, at least for a few days a week. Sometimes limiting high-impact activities will provide relief.   Make sure your shoes fit well. Choose footwear that is right for your sport.   Protect your knees. Use the proper gear for knee-sensitive activities. Use kneepads when playing volleyball or laying carpet. Buckle your seat belt every time you drive. Most shattered kneecaps occur in car accidents.   Rest when you are tired.  SEEK MEDICAL CARE IF:  You have knee pain that is continual and does not   seem to be getting better.  SEEK IMMEDIATE MEDICAL CARE IF:  Your knee joint feels hot to the touch and you have a high fever. MAKE SURE YOU:   Understand these instructions.   Will watch your condition.   Will get help right away if you are not doing well or get worse.  Document Released: 02/18/2007 Document Revised: 04/12/2011 Document Reviewed: 02/18/2007 [employer] Patient Information 2012 [employer]. 

## 2011-07-27 NOTE — ED Notes (Signed)
Pt presents with chronic rt knee pain, severe pain started this morning. Had MRI In past week, is pt of Dr Romeo Apple.

## 2011-07-27 NOTE — ED Notes (Signed)
Pt presents to ED secondary to Rt knee pain. Pt reports awoke this morning with severe pain in knee. Has had an MRI in past week per Dr Mort Sawyers office with the anticipation of repeat knee surgery in April. Ice pack applied to knee, pt refers to pain as a stabbing pain that decreases when knee is bent.  No swelling noted.

## 2011-07-27 NOTE — ED Provider Notes (Signed)
History     CSN: [id number]  Arrival date & time 07/27/11  1209   First MD Initiated Contact with Patient 07/27/11 1315      Chief Complaint  Patient presents with  . Knee Pain    (Consider location/radiation/quality/duration/timing/severity/associated sxs/prior treatment) HPI Comments: Patient who is currently under the care of an orthopedic doctor for knee pain comes to ED with c/o worsening pain   States that she saw Dr. Romeo Apple several days ago and have been advised that she will need surgery to her knee.  States that she woke up with worsening pain.   Denies new injury, swelling, redness or calf pain.  States the hydrocodone that Dr. Romeo Apple prescribed is not controlling the pain.  Patient is a 41 y.o. female presenting with knee pain. The history is provided by the patient. No language interpreter was used.  Knee Pain This is a chronic problem. Episode onset: on the day of ed arrival. The problem occurs constantly. The problem has been unchanged. Associated symptoms include arthralgias. Pertinent negatives include no fever, joint swelling, nausea, numbness, rash, vomiting or weakness. The symptoms are aggravated by twisting, walking and standing. She has tried oral narcotics for the symptoms. The treatment provided no relief.    History reviewed. No pertinent past medical history.  Past Surgical History  Procedure Date  . Right arm   . Tubal ligation   . Knee surgery     Family History  Problem Relation Age of Onset  . Arthritis      family history     History  Substance Use Topics  . Smoking status: Current Everyday Smoker    Types: Cigarettes  . Smokeless tobacco: Not on file  . Alcohol Use: Yes     OCCASIONALLY     OB History    Grav Para Term Preterm Abortions TAB SAB Ect Mult Living                  Review of Systems  Constitutional: Negative for fever.  Gastrointestinal: Negative for nausea and vomiting.  Musculoskeletal: Positive for arthralgias.  Negative for back pain and joint swelling.  Skin: Negative.  Negative for rash.  Neurological: Negative for weakness and numbness.  All other systems reviewed and are negative.    Allergies  Review of patient's allergies indicates no known allergies.  Home Medications   Current Outpatient Rx  Name Route Sig Dispense Refill  . HYDROCODONE-ACETAMINOPHEN 5-325 MG PO TABS Oral Take 1 tablet by mouth every 4 (four) hours as needed for pain. 60 tablet 1  . IBUPROFEN 800 MG PO TABS        BP 151/83  Pulse 60  Temp(Src) 98.2 F (36.8 C) (Oral)  Resp 22  Ht 5\' 2"  (1.575 m)  Wt 150 lb (68.04 kg)  BMI 27.44 kg/m2  SpO2 100%  LMP 07/20/2011  Physical Exam  Nursing note and vitals reviewed. Constitutional: She is oriented to person, place, and time. She appears well-developed and well-nourished. No distress.  HENT:  Head: Normocephalic and atraumatic.  Cardiovascular: Normal rate, regular rhythm and normal heart sounds.   Pulmonary/Chest: Effort normal and breath sounds normal.  Musculoskeletal: She exhibits tenderness. She exhibits no edema.       Right knee: She exhibits normal range of motion, no swelling, no effusion, no ecchymosis, no deformity, no laceration and no erythema. tenderness found. Medial joint line and lateral joint line tenderness noted.       Legs:  Diffuse ttp of the right knee.  No edema, erythema or obvious deformity.  DP pulse is brisk, sensation intact,   Neurological: She is alert and oriented to person, place, and time. She exhibits normal muscle tone. Coordination normal.  Skin: Skin is warm and dry.    ED Course  Procedures (including critical care time)  Labs Reviewed - No data to display Dg Knee Complete 4 Views Right  07/27/2011  *RADIOLOGY REPORT*  Clinical Data: Right knee pain.  Prior right knee surgery.  RIGHT KNEE - COMPLETE 4+ VIEW 07/27/2011:  Comparison: Right knee MRI 07/18/2011, 04/05/2010.  Right knee x- rays 04/02/2010.  Findings: No  evidence of acute, subacute, or healed fractures. Joint spaces well preserved.  Bone mineral density well preserved. Small joint effusion suspected.  IMPRESSION: No osseous abnormality.  Small joint effusion suspected.  Original Report Authenticated By: Arnell Sieving, M.D.     Patient had MRI of the right knee on 07/12/2011. I have reviewed the radiologist's interpretation.  MDM   Patient with persistent right knee pain. There's no obvious deformities or step-offs of the knee. There is no erythema or effusion. Patient has full range of motion but pain is reproduced with flexion. No calf tenderness or swelling. DP pulses are brisk, distal sensation intact, capillary refill less than 2 seconds. Patient prefers to followup with Dr. Romeo Apple on Monday. I will give her a short course of Percocet for pain and dispense crutches and a knee immobilizer for comfort.   i have tried to consult Dr. Romeo Apple , but he is currently in surgery.  Herminia Warren L. [place], [place] 07/29/11 1212

## 2011-08-06 ENCOUNTER — Encounter: Payer: Self-pay | Admitting: Orthopedic Surgery

## 2011-08-06 NOTE — ED Provider Notes (Signed)
Medical screening examination/treatment/procedure(s) were performed by non-physician practitioner and as supervising physician I was immediately available for consultation/collaboration.  Nicoletta Dress. Colon Branch, MD 08/06/11 1342

## 2011-08-09 ENCOUNTER — Encounter (HOSPITAL_COMMUNITY): Payer: Self-pay | Admitting: Pharmacy Technician

## 2011-08-10 ENCOUNTER — Telehealth: Payer: Self-pay | Admitting: Orthopedic Surgery

## 2011-08-10 NOTE — Telephone Encounter (Addendum)
Contacted insurer, [employer], ph# [phone], next to ph# [phone] re: out-patient, possible admit to in-patient surgery scheduled 08/17/11 at [facility].  Planned CPT procedure codes: 29562 + 29881.  As long as done as out-patient, no prior authorization is required.  If it becomes in-patient/admit, prior authorization will be required, per Bonnita Levan, in care management.  Ref# is [id number].

## 2011-08-13 ENCOUNTER — Encounter (HOSPITAL_COMMUNITY): Payer: Self-pay

## 2011-08-13 ENCOUNTER — Encounter (HOSPITAL_COMMUNITY)
Admission: RE | Admit: 2011-08-13 | Discharge: 2011-08-13 | Disposition: A | Discharge status: Home / Self Care | Payer: BC Managed Care – PPO | Source: Ambulatory Visit | Attending: Orthopedic Surgery | Admitting: Orthopedic Surgery

## 2011-08-13 LAB — HEMOGLOBIN AND HEMATOCRIT, BLOOD
HCT: 37.5 % (ref 36.0–46.0)
Hemoglobin: 12.2 g/dL (ref 12.0–15.0)

## 2011-08-13 NOTE — Patient Instructions (Addendum)
20 Karen Mercer  08/13/2011   Your procedure is scheduled on:   08/17/2011  Report to [facility] at  615  AM.  Call this number if you have problems the morning of surgery: [phone]   Remember:   Do not eat food:After Midnight.  May have clear liquids:until Midnight .  Clear liquids include soda, tea, black coffee, apple or grape juice, broth.  Take these medicines the morning of surgery with A SIP OF WATER:  norco   Do not wear jewelry, make-up or nail polish.  Do not wear lotions, powders, or perfumes. You may wear deodorant.  Do not shave 48 hours prior to surgery.  Do not bring valuables to the hospital.  Contacts, dentures or bridgework may not be worn into surgery.  Leave suitcase in the car. After surgery it may be brought to your room.  For patients admitted to the hospital, checkout time is 11:00 AM the day of discharge.   Patients discharged the day of surgery will not be allowed to drive home.  Name and phone number of your driver: family  Special Instructions: hibicleans shower night before and am of surgery.1/2 bottle each time.   Please read over the following fact sheets that you were given: Pain Booklet, MRSA Information, Surgical Site Infection Prevention, Anesthesia Post-op Instructions and Care and Recovery After Surgery Arthroscopic Procedure, Knee An arthroscopic procedure can find what is wrong with your knee. PROCEDURE Arthroscopy is a surgical technique that allows your orthopedic surgeon to diagnose and treat your knee injury with accuracy. They will look into your knee through a small instrument. This is almost like a small (pencil sized) telescope. Because arthroscopy affects your knee less than open knee surgery, you can anticipate a more rapid recovery. Taking an active role by following your caregiver's instructions will help with rapid and complete recovery. Use crutches, rest, elevation, ice, and knee exercises as instructed. The length of recovery  depends on various factors including type of injury, age, physical condition, medical conditions, and your rehabilitation. Your knee is the joint between the large bones (femur and tibia) in your leg. Cartilage covers these bone ends which are smooth and slippery and allow your knee to bend and move smoothly. Two menisci, thick, semi-lunar shaped pads of cartilage which form a rim inside the joint, help absorb shock and stabilize your knee. Ligaments bind the bones together and support your knee joint. Muscles move the joint, help support your knee, and take stress off the joint itself. Because of this all programs and physical therapy to rehabilitate an injured or repaired knee require rebuilding and strengthening your muscles. AFTER THE PROCEDURE  After the procedure, you will be moved to a recovery area until most of the effects of the medication have worn off. Your caregiver will discuss the test results with you.   Only take over-the-counter or prescription medicines for pain, discomfort, or fever as directed by your caregiver.  SEEK MEDICAL CARE IF:   You have increased bleeding from your wounds.   You see redness, swelling, or have increasing pain in your wounds.   You have pus coming from your wound.   You have an oral temperature above 102 F (38.9 C).   You notice a bad smell coming from the wound or dressing.   You have severe pain with any motion of your knee.  SEEK IMMEDIATE MEDICAL CARE IF:   You develop a rash.   You have difficulty breathing.   You have  any allergic problems.  Document Released: 04/20/2000 Document Revised: 04/12/2011 Document Reviewed: 11/12/2007 [facility] Patient Information 2012 [place], [place].PATIENT INSTRUCTIONS POST-ANESTHESIA  IMMEDIATELY FOLLOWING SURGERY:  Do not drive or operate machinery for the first twenty four hours after surgery.  Do not make any important decisions for twenty four hours after surgery or while taking narcotic pain  medications or sedatives.  If you develop intractable nausea and vomiting or a severe headache please notify your doctor immediately.  FOLLOW-UP:  Please make an appointment with your surgeon as instructed. You do not need to follow up with anesthesia unless specifically instructed to do so.  WOUND CARE INSTRUCTIONS (if applicable):  Keep a dry clean dressing on the anesthesia/puncture wound site if there is drainage.  Once the wound has quit draining you may leave it open to air.  Generally you should leave the bandage intact for twenty four hours unless there is drainage.  If the epidural site drains for more than 36-48 hours please call the anesthesia department.  QUESTIONS?:  Please feel free to call your physician or the hospital operator if you have any questions, and they will be happy to assist you.     [facility] Anesthesia Department [address] [place] [phone]

## 2011-08-16 NOTE — H&P (Signed)
Karen Mercer is an 41 y.o. female.   Chief Complaint: right knee pain and recurrent subluxation of the patella   HPI: 41 yo female with lig laxity s/p proximal soft tissue realignment p/w recurrent patella sublxation and pain.  Physical therapy, injection/aspiration, bracing and prior surgery have failed to control symptoms.  She is eager to have something else done " Just Fix It"     No past medical history on file.  Past Surgical History  Procedure Date  . Right arm     rods from fractured arm  . Tubal ligation   . Knee surgery 2012    right knee-arthroscopy    Family History  Problem Relation Age of Onset  . Arthritis      family history    Social History:  reports that she has been smoking Cigarettes.  She has a 15 pack-year smoking history. She does not have any smokeless tobacco history on file. She reports that she drinks alcohol. She reports that she does not use illicit drugs.  Allergies: No Known Allergies  Medications Prior to Admission  Medication Dose Route Frequency Provider Last Rate Last Dose  . methylPREDNISolone acetate (DEPO-MEDROL) injection 40 mg  40 mg Intra-articular Once [name], MD       Medications Prior to Admission  Medication Sig Dispense Refill  . HYDROcodone-acetaminophen (NORCO) 5-325 MG per tablet Take 1 tablet by mouth every 4 (four) hours as needed for pain.  60 tablet  1  . ibuprofen (ADVIL,MOTRIN) 800 MG tablet Take 800 mg by mouth every 6 (six) hours as needed. FOR PAIN        No results found for this or any previous visit (from the past 48 hour(s)). No results found.  Review of Systems  Musculoskeletal: Positive for joint pain.  All other systems reviewed and are negative.    There were no vitals taken for this visit. Physical Exam  Vitals reviewed. Constitutional: She is oriented to person, place, and time. She appears well-developed and well-nourished. No distress.  HENT:  Head: Atraumatic.  Eyes: Pupils  are equal, round, and reactive to light.  Neck: Normal range of motion. Neck supple. No JVD present. No tracheal deviation present.  Cardiovascular: Normal rate and regular rhythm.   Respiratory: Effort normal. No stridor.  GI: Soft.  Musculoskeletal:       Right shoulder: Normal.       Left shoulder: Normal.       Right elbow: Normal.      Left elbow: Normal.       Right wrist: Normal.       Left wrist: Normal.       Right hip: Normal.       Left hip: Normal.       Right knee: She exhibits decreased range of motion, swelling, effusion, abnormal alignment and abnormal patellar mobility. She exhibits no ecchymosis, no deformity, no laceration, no erythema, no LCL laxity, no bony tenderness, normal meniscus and no MCL laxity. tenderness found. No medial joint line, no lateral joint line, no MCL, no LCL and no patellar tendon tenderness noted.       Right ankle: Normal.       Left ankle: Normal.       Legs: Lymphadenopathy:    She has no cervical adenopathy.       Right: No inguinal adenopathy present.       Left: No inguinal adenopathy present.  Neurological: She is alert and oriented to person,  place, and time. She has normal reflexes. She displays normal reflexes. No cranial nerve deficit. She exhibits normal muscle tone. Coordination normal.  Skin: Skin is warm and dry. No rash noted. She is not diaphoretic. No erythema. No pallor.  Psychiatric: She has a normal mood and affect. Her behavior is normal. Judgment and thought content normal.     Assessment/Plan MRI:  There is a moderate joint effusion without a Baker's  cyst.  The menisci are intact with only minimal degenerative changes of  the posterior horn of the medial meniscus.  Cruciate and collateral ligaments are intact. Chronic thickening  of the MCL consistent with prior sprain. No acute abnormality of  the MCL.  Distal quadriceps tendon and patellar tendon are intact. Slight  chronic thickening of the proximal patellar  tendon.  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  No chondromalacia in the medial or lateral compartments. The  adjacent soft tissues are normal.  IMPRESSION:  Moderate nonspecific joint effusion. The possibility of synovitis  should be considered.  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  Recurrent right patella subluxation   Tibial tubercle transfer right knee   [name] 08/16/2011, 12:29 PM

## 2011-08-17 ENCOUNTER — Ambulatory Visit (HOSPITAL_COMMUNITY): Payer: BC Managed Care – PPO

## 2011-08-17 ENCOUNTER — Ambulatory Visit (HOSPITAL_COMMUNITY): Payer: BC Managed Care – PPO | Admitting: Anesthesiology

## 2011-08-17 ENCOUNTER — Encounter (HOSPITAL_COMMUNITY): Payer: Self-pay | Admitting: *Deleted

## 2011-08-17 ENCOUNTER — Encounter (HOSPITAL_COMMUNITY): Payer: Self-pay | Admitting: Anesthesiology

## 2011-08-17 ENCOUNTER — Encounter (HOSPITAL_COMMUNITY)
Admission: RE | Disposition: A | Discharge status: Home / Self Care | Payer: Self-pay | Source: Ambulatory Visit | Attending: Orthopedic Surgery

## 2011-08-17 ENCOUNTER — Ambulatory Visit (HOSPITAL_COMMUNITY)
Admission: RE | Admit: 2011-08-17 | Discharge: 2011-08-18 | Disposition: A | Discharge status: Home / Self Care | Payer: BC Managed Care – PPO | Source: Ambulatory Visit | Attending: Orthopedic Surgery | Admitting: Orthopedic Surgery

## 2011-08-17 DIAGNOSIS — Z01812 Encounter for preprocedural laboratory examination: Secondary | ICD-10-CM | POA: Insufficient documentation

## 2011-08-17 DIAGNOSIS — M24469 Recurrent dislocation, unspecified knee: Secondary | ICD-10-CM | POA: Insufficient documentation

## 2011-08-17 DIAGNOSIS — M2201 Recurrent dislocation of patella, right knee: Secondary | ICD-10-CM

## 2011-08-17 DIAGNOSIS — G8918 Other acute postprocedural pain: Secondary | ICD-10-CM | POA: Insufficient documentation

## 2011-08-17 HISTORY — PX: KNEE ARTHROSCOPY: SHX127

## 2011-08-17 SURGERY — ARTHROSCOPY, KNEE
Anesthesia: General | Site: Knee | Laterality: Right | Wound class: Clean

## 2011-08-17 MED ORDER — ONDANSETRON HCL 4 MG/2ML IJ SOLN
4.0000 mg | Freq: Four times a day (QID) | INTRAMUSCULAR | Status: DC | PRN
  Administered 2011-08-17: 4 mg via INTRAVENOUS
  Filled 2011-08-17: qty 2

## 2011-08-17 MED ORDER — SODIUM CHLORIDE 0.9 % IV SOLN
INTRAVENOUS | Status: DC
  Administered 2011-08-17: 950 mL via INTRAVENOUS
  Administered 2011-08-18: 05:00:00 via INTRAVENOUS

## 2011-08-17 MED ORDER — FENTANYL CITRATE 0.05 MG/ML IJ SOLN
INTRAMUSCULAR | Status: AC
  Administered 2011-08-17: 50 ug via INTRAVENOUS
  Filled 2011-08-17: qty 2

## 2011-08-17 MED ORDER — OXYCODONE HCL 5 MG PO TABS
5.0000 mg | ORAL_TABLET | ORAL | Status: DC
  Administered 2011-08-17: 5 mg via ORAL

## 2011-08-17 MED ORDER — MIDAZOLAM HCL 2 MG/2ML IJ SOLN
INTRAMUSCULAR | Status: AC
  Administered 2011-08-17: 2 mg via INTRAVENOUS
  Filled 2011-08-17: qty 2

## 2011-08-17 MED ORDER — CELECOXIB 100 MG PO CAPS
ORAL_CAPSULE | ORAL | Status: AC
  Administered 2011-08-17: 400 mg via ORAL
  Filled 2011-08-17: qty 4

## 2011-08-17 MED ORDER — BUPIVACAINE-EPINEPHRINE PF 0.5-1:200000 % IJ SOLN
INTRAMUSCULAR | Status: DC | PRN
  Administered 2011-08-17: 60 mL

## 2011-08-17 MED ORDER — SODIUM CHLORIDE 0.9 % IJ SOLN
INTRAMUSCULAR | Status: AC
  Administered 2011-08-17: 3 mL
  Filled 2011-08-17: qty 3

## 2011-08-17 MED ORDER — OXYCODONE HCL 5 MG PO TABS
ORAL_TABLET | ORAL | Status: AC
  Administered 2011-08-17: 5 mg via ORAL
  Filled 2011-08-17: qty 1

## 2011-08-17 MED ORDER — OXYCODONE HCL 5 MG PO TABS
10.0000 mg | ORAL_TABLET | ORAL | Status: DC
Start: 2011-08-17 — End: 2011-08-18
  Administered 2011-08-17 – 2011-08-18 (×5): 10 mg via ORAL
  Filled 2011-08-17 (×5): qty 2

## 2011-08-17 MED ORDER — HYDROMORPHONE HCL PF 1 MG/ML IJ SOLN
0.5000 mg | INTRAMUSCULAR | Status: DC | PRN
  Administered 2011-08-17 (×2): 0.5 mg via INTRAVENOUS

## 2011-08-17 MED ORDER — CEFAZOLIN SODIUM 1-5 GM-% IV SOLN
1.0000 g | INTRAVENOUS | Status: AC
  Administered 2011-08-17: 1 g via INTRAVENOUS

## 2011-08-17 MED ORDER — ONDANSETRON HCL 4 MG/2ML IJ SOLN
4.0000 mg | Freq: Once | INTRAMUSCULAR | Status: AC | PRN
  Administered 2011-08-17: 4 mg via INTRAVENOUS

## 2011-08-17 MED ORDER — ACETAMINOPHEN 10 MG/ML IV SOLN
1000.0000 mg | Freq: Four times a day (QID) | INTRAVENOUS | Status: AC
  Administered 2011-08-17: 1000 mg via INTRAVENOUS
  Filled 2011-08-17 (×2): qty 100

## 2011-08-17 MED ORDER — CELECOXIB 100 MG PO CAPS
400.0000 mg | ORAL_CAPSULE | Freq: Once | ORAL | Status: AC
  Administered 2011-08-17: 400 mg via ORAL

## 2011-08-17 MED ORDER — PROPOFOL 10 MG/ML IV EMUL
INTRAVENOUS | Status: DC | PRN
  Administered 2011-08-17: 50 mg via INTRAVENOUS
  Administered 2011-08-17: 150 mg via INTRAVENOUS

## 2011-08-17 MED ORDER — MENTHOL 3 MG MT LOZG: 1.0000 | LOZENGE | OROMUCOSAL | Status: DC | PRN

## 2011-08-17 MED ORDER — MIDAZOLAM HCL 2 MG/2ML IJ SOLN
1.0000 mg | INTRAMUSCULAR | Status: DC | PRN
  Administered 2011-08-17: 2 mg via INTRAVENOUS

## 2011-08-17 MED ORDER — ACETAMINOPHEN 10 MG/ML IV SOLN
INTRAVENOUS | Status: AC
  Administered 2011-08-17: 1000 mg via INTRAVENOUS
  Filled 2011-08-17: qty 100

## 2011-08-17 MED ORDER — METOCLOPRAMIDE HCL 10 MG PO TABS: 5.0000 mg | ORAL_TABLET | Freq: Three times a day (TID) | ORAL | Status: DC | PRN

## 2011-08-17 MED ORDER — ONDANSETRON HCL 4 MG/2ML IJ SOLN: 4.0000 mg | Freq: Once | INTRAMUSCULAR | Status: DC

## 2011-08-17 MED ORDER — ONDANSETRON HCL 4 MG/2ML IJ SOLN
INTRAMUSCULAR | Status: AC
  Administered 2011-08-17: 4 mg via INTRAVENOUS
  Filled 2011-08-17: qty 2

## 2011-08-17 MED ORDER — CEFAZOLIN SODIUM 1-5 GM-% IV SOLN
INTRAVENOUS | Status: AC
  Filled 2011-08-17: qty 50

## 2011-08-17 MED ORDER — KETOROLAC TROMETHAMINE 30 MG/ML IJ SOLN
30.0000 mg | Freq: Four times a day (QID) | INTRAMUSCULAR | Status: AC
  Administered 2011-08-17 – 2011-08-18 (×3): 30 mg via INTRAVENOUS
  Filled 2011-08-17 (×3): qty 1

## 2011-08-17 MED ORDER — LACTATED RINGERS IV SOLN
INTRAVENOUS | Status: DC
  Administered 2011-08-17: 1000 mL via INTRAVENOUS

## 2011-08-17 MED ORDER — GLYCOPYRROLATE 0.2 MG/ML IJ SOLN
INTRAMUSCULAR | Status: AC
  Administered 2011-08-17: 0.2 mg via INTRAVENOUS
  Filled 2011-08-17: qty 1

## 2011-08-17 MED ORDER — EPINEPHRINE HCL 1 MG/ML IJ SOLN
INTRAMUSCULAR | Status: AC
  Filled 2011-08-17: qty 5

## 2011-08-17 MED ORDER — FENTANYL CITRATE 0.05 MG/ML IJ SOLN
25.0000 ug | INTRAMUSCULAR | Status: DC | PRN
  Administered 2011-08-17 (×4): 50 ug via INTRAVENOUS

## 2011-08-17 MED ORDER — KETOROLAC TROMETHAMINE 30 MG/ML IJ SOLN
INTRAMUSCULAR | Status: AC
  Administered 2011-08-17: 30 mg via INTRAVENOUS
  Filled 2011-08-17: qty 1

## 2011-08-17 MED ORDER — GLYCOPYRROLATE 0.2 MG/ML IJ SOLN
0.2000 mg | Freq: Once | INTRAMUSCULAR | Status: AC
  Administered 2011-08-17: 0.2 mg via INTRAVENOUS

## 2011-08-17 MED ORDER — BUPIVACAINE-EPINEPHRINE PF 0.5-1:200000 % IJ SOLN
INTRAMUSCULAR | Status: AC
  Filled 2011-08-17: qty 20

## 2011-08-17 MED ORDER — SODIUM CHLORIDE 0.9 % IR SOLN
Status: DC | PRN
  Administered 2011-08-17: 1000 mL

## 2011-08-17 MED ORDER — FENTANYL CITRATE 0.05 MG/ML IJ SOLN
INTRAMUSCULAR | Status: AC
  Filled 2011-08-17: qty 2

## 2011-08-17 MED ORDER — METOCLOPRAMIDE HCL 5 MG/ML IJ SOLN: 5.0000 mg | Freq: Three times a day (TID) | INTRAMUSCULAR | Status: DC | PRN

## 2011-08-17 MED ORDER — PROPOFOL 10 MG/ML IV EMUL
INTRAVENOUS | Status: AC
  Filled 2011-08-17: qty 20

## 2011-08-17 MED ORDER — PHENOL 1.4 % MT LIQD: 1.0000 | OROMUCOSAL | Status: DC | PRN

## 2011-08-17 MED ORDER — ONDANSETRON HCL 4 MG PO TABS: 4.0000 mg | ORAL_TABLET | Freq: Four times a day (QID) | ORAL | Status: DC | PRN

## 2011-08-17 MED ORDER — METHOCARBAMOL 100 MG/ML IJ SOLN
500.0000 mg | Freq: Four times a day (QID) | INTRAVENOUS | Status: DC
  Filled 2011-08-17 (×13): qty 5

## 2011-08-17 MED ORDER — HYDROMORPHONE HCL PF 1 MG/ML IJ SOLN
INTRAMUSCULAR | Status: AC
  Administered 2011-08-17: 0.5 mg via INTRAVENOUS
  Filled 2011-08-17: qty 1

## 2011-08-17 MED ORDER — ACETAMINOPHEN 325 MG PO TABS: 650.0000 mg | ORAL_TABLET | Freq: Four times a day (QID) | ORAL | Status: DC | PRN

## 2011-08-17 MED ORDER — KETOROLAC TROMETHAMINE 30 MG/ML IJ SOLN
30.0000 mg | Freq: Once | INTRAMUSCULAR | Status: AC
  Administered 2011-08-17: 30 mg via INTRAVENOUS

## 2011-08-17 MED ORDER — HYDROMORPHONE HCL PF 1 MG/ML IJ SOLN
0.5000 mg | INTRAMUSCULAR | Status: DC | PRN
  Administered 2011-08-17 (×2): 1 mg via INTRAVENOUS
  Filled 2011-08-17 (×2): qty 1

## 2011-08-17 MED ORDER — DIPHENHYDRAMINE HCL 12.5 MG/5ML PO ELIX: 12.5000 mg | ORAL_SOLUTION | ORAL | Status: DC | PRN

## 2011-08-17 MED ORDER — METHOCARBAMOL 500 MG PO TABS
500.0000 mg | ORAL_TABLET | Freq: Four times a day (QID) | ORAL | Status: DC
  Administered 2011-08-17 – 2011-08-18 (×4): 500 mg via ORAL
  Filled 2011-08-17 (×4): qty 1

## 2011-08-17 MED ORDER — METHOCARBAMOL 100 MG/ML IJ SOLN
500.0000 mg | Freq: Once | INTRAVENOUS | Status: AC
  Administered 2011-08-17: 500 mg via INTRAVENOUS
  Filled 2011-08-17: qty 5

## 2011-08-17 MED ORDER — CHLORHEXIDINE GLUCONATE 4 % EX LIQD
60.0000 mL | Freq: Once | CUTANEOUS | Status: DC
  Filled 2011-08-17: qty 60

## 2011-08-17 MED ORDER — SODIUM CHLORIDE 0.9 % IR SOLN
Status: DC | PRN
  Administered 2011-08-17 (×4)

## 2011-08-17 MED ORDER — PREGABALIN 50 MG PO CAPS
50.0000 mg | ORAL_CAPSULE | Freq: Three times a day (TID) | ORAL | Status: DC
  Administered 2011-08-17: 50 mg via ORAL

## 2011-08-17 MED ORDER — ACETAMINOPHEN 325 MG PO TABS: 325.0000 mg | ORAL_TABLET | ORAL | Status: DC | PRN

## 2011-08-17 MED ORDER — ACETAMINOPHEN 650 MG RE SUPP: 650.0000 mg | Freq: Four times a day (QID) | RECTAL | Status: DC | PRN

## 2011-08-17 MED ORDER — ALUM & MAG HYDROXIDE-SIMETH 200-200-20 MG/5ML PO SUSP: 30.0000 mL | ORAL | Status: DC | PRN

## 2011-08-17 MED ORDER — ONDANSETRON HCL 4 MG/2ML IJ SOLN
4.0000 mg | Freq: Once | INTRAMUSCULAR | Status: AC
  Administered 2011-08-17: 4 mg via INTRAVENOUS

## 2011-08-17 MED ORDER — DOCUSATE SODIUM 100 MG PO CAPS
100.0000 mg | ORAL_CAPSULE | Freq: Two times a day (BID) | ORAL | Status: DC
  Administered 2011-08-17 – 2011-08-18 (×3): 100 mg via ORAL
  Filled 2011-08-17 (×3): qty 1

## 2011-08-17 MED ORDER — CEFAZOLIN SODIUM 1-5 GM-% IV SOLN
1.0000 g | Freq: Four times a day (QID) | INTRAVENOUS | Status: AC
  Administered 2011-08-17 – 2011-08-18 (×3): 1 g via INTRAVENOUS
  Filled 2011-08-17 (×3): qty 50

## 2011-08-17 MED ORDER — ACETAMINOPHEN 10 MG/ML IV SOLN
1000.0000 mg | Freq: Once | INTRAVENOUS | Status: AC
  Administered 2011-08-17: 1000 mg via INTRAVENOUS

## 2011-08-17 MED ORDER — LIDOCAINE HCL (PF) 1 % IJ SOLN
INTRAMUSCULAR | Status: AC
  Filled 2011-08-17: qty 5

## 2011-08-17 MED ORDER — FENTANYL CITRATE 0.05 MG/ML IJ SOLN
INTRAMUSCULAR | Status: DC | PRN
  Administered 2011-08-17 (×3): 25 ug via INTRAVENOUS
  Administered 2011-08-17: 50 ug via INTRAVENOUS
  Administered 2011-08-17 (×3): 25 ug via INTRAVENOUS

## 2011-08-17 MED ORDER — LIDOCAINE HCL (CARDIAC) 10 MG/ML IV SOLN
INTRAVENOUS | Status: DC | PRN
  Administered 2011-08-17: 10 mg via INTRAVENOUS

## 2011-08-17 MED ORDER — PREGABALIN 50 MG PO CAPS
ORAL_CAPSULE | ORAL | Status: AC
  Administered 2011-08-17: 50 mg via ORAL
  Filled 2011-08-17: qty 1

## 2011-08-17 SURGICAL SUPPLY — 83 items
ARTHROWAND PARAGON T2 (SURGICAL WAND)
BAG HAMPER (MISCELLANEOUS) ×2 IMPLANT
BANDAGE ELASTIC 6 VELCRO NS (GAUZE/BANDAGES/DRESSINGS) ×2 IMPLANT
BANDAGE ESMARK 4X12 BL STRL LF (DISPOSABLE) IMPLANT
BIT DRILL Q COUPLING 4.5 (BIT) ×1 IMPLANT
BIT DRILL Q/COUPLING 1 (BIT) ×1 IMPLANT
BLADE AGGRESSIVE PLUS 4.0 (BLADE) ×2 IMPLANT
BLADE SAW SGTL 83.5X18.5 (BLADE) ×1 IMPLANT
BLADE SURG SZ10 CARB STEEL (BLADE) ×1 IMPLANT
BLADE SURG SZ11 CARB STEEL (BLADE) ×2 IMPLANT
BNDG CMPR 12X4 ELC STRL LF (DISPOSABLE) ×1
BNDG ESMARK 4X12 BLUE STRL LF (DISPOSABLE) ×2
BRACE T-SCOPE KNEE POSTOP (MISCELLANEOUS) ×1 IMPLANT
CHLORAPREP W/TINT 26ML (MISCELLANEOUS) ×4 IMPLANT
CLOTH BEACON ORANGE TIMEOUT ST (SAFETY) ×2 IMPLANT
COOLER CRYO IC GRAV AND TUBE (ORTHOPEDIC SUPPLIES) ×2 IMPLANT
COVER MAYO STAND XLG (DRAPE) ×2 IMPLANT
COVER PROBE W GEL 5X96 (DRAPES) ×2 IMPLANT
COVER X RAY CASSETTE (MISCELLANEOUS) ×2 IMPLANT
CUFF CRYO KNEE LG 20X31 COOLER (ORTHOPEDIC SUPPLIES) IMPLANT
CUFF CRYO KNEE18X23 MED (MISCELLANEOUS) ×1 IMPLANT
CUFF TOURNIQUET SINGLE 34IN LL (TOURNIQUET CUFF) ×1 IMPLANT
CUFF TOURNIQUET SINGLE 44IN (TOURNIQUET CUFF) IMPLANT
CUTTER ANGLED DBL BITE 4.5 (BURR) IMPLANT
DECANTER SPIKE VIAL GLASS SM (MISCELLANEOUS) ×4 IMPLANT
DRAPE PROXIMA HALF (DRAPES) ×1 IMPLANT
ELECT CAUTERY BLADE 6.4 (BLADE) ×1 IMPLANT
EVACUATOR 3/16  PVC DRAIN (DRAIN) ×1
EVACUATOR 3/16 PVC DRAIN (DRAIN) IMPLANT
FLOOR PAD 36X40 (MISCELLANEOUS)
GAUZE SPONGE 4X4 16PLY XRAY LF (GAUZE/BANDAGES/DRESSINGS) ×2 IMPLANT
GAUZE XEROFORM 5X9 LF (GAUZE/BANDAGES/DRESSINGS) ×2 IMPLANT
GLOVE BIOGEL PI IND STRL 7.0 (GLOVE) IMPLANT
GLOVE BIOGEL PI INDICATOR 7.0 (GLOVE) ×2
GLOVE ECLIPSE 6.5 STRL STRAW (GLOVE) ×2 IMPLANT
GLOVE EXAM NITRILE MD LF STRL (GLOVE) ×1 IMPLANT
GLOVE SKINSENSE NS SZ8.0 LF (GLOVE) ×1
GLOVE SKINSENSE STRL SZ8.0 LF (GLOVE) ×1 IMPLANT
GLOVE SS N UNI LF 8.5 STRL (GLOVE) ×2 IMPLANT
GOWN STRL REIN XL XLG (GOWN DISPOSABLE) ×4 IMPLANT
HLDR LEG FOAM (MISCELLANEOUS) ×1 IMPLANT
IV NS IRRIG 3000ML ARTHROMATIC (IV SOLUTION) ×4 IMPLANT
KIT BLADEGUARD II DBL (SET/KITS/TRAYS/PACK) ×2 IMPLANT
KIT DISPOSABLE T3AM2 (KITS) ×1 IMPLANT
KIT ROOM TURNOVER AP CYSTO (KITS) ×2 IMPLANT
LEG HOLDER FOAM (MISCELLANEOUS) ×1
MANIFOLD NEPTUNE II (INSTRUMENTS) ×2 IMPLANT
MARKER SKIN DUAL TIP RULER LAB (MISCELLANEOUS) ×2 IMPLANT
NDL HYPO 18GX1.5 BLUNT FILL (NEEDLE) ×1 IMPLANT
NDL HYPO 21X1.5 SAFETY (NEEDLE) ×1 IMPLANT
NDL SPNL 18GX3.5 QUINCKE PK (NEEDLE) ×1 IMPLANT
NEEDLE HYPO 18GX1.5 BLUNT FILL (NEEDLE) ×2 IMPLANT
NEEDLE HYPO 21X1.5 SAFETY (NEEDLE) ×2 IMPLANT
NEEDLE SPNL 18GX3.5 QUINCKE PK (NEEDLE) ×2 IMPLANT
NS IRRIG 1000ML POUR BTL (IV SOLUTION) ×2 IMPLANT
PACK ARTHRO LIMB DRAPE STRL (MISCELLANEOUS) ×2 IMPLANT
PAD ABD 5X9 TENDERSORB (GAUZE/BANDAGES/DRESSINGS) ×2 IMPLANT
PAD ARMBOARD 7.5X6 YLW CONV (MISCELLANEOUS) ×2 IMPLANT
PAD FLOOR 36X40 (MISCELLANEOUS) ×1 IMPLANT
PADDING CAST COTTON 6X4 STRL (CAST SUPPLIES) ×2 IMPLANT
PENCIL HANDSWITCHING (ELECTRODE) ×1 IMPLANT
SCREW CORTEX ST 4.5X46 (Screw) ×1 IMPLANT
SCREW CORTEX ST 4.5X54 (Screw) ×1 IMPLANT
SET ARTHROSCOPY INST (INSTRUMENTS) ×2 IMPLANT
SET ARTHROSCOPY PUMP TUBE (IRRIGATION / IRRIGATOR) ×2 IMPLANT
SET BASIN LINEN APH (SET/KITS/TRAYS/PACK) ×2 IMPLANT
SPONGE GAUZE 4X4 12PLY (GAUZE/BANDAGES/DRESSINGS) ×2 IMPLANT
SPONGE LAP 18X18 X RAY DECT (DISPOSABLE) ×2 IMPLANT
STAPLER VISISTAT (STAPLE) ×1 IMPLANT
STAPLER VISISTAT 35W (STAPLE) ×1 IMPLANT
STRIP CLOSURE SKIN 1/2X4 (GAUZE/BANDAGES/DRESSINGS) ×1 IMPLANT
SUT ETHIBOND NAB OS 4 #2 30IN (SUTURE) ×3 IMPLANT
SUT ETHILON 3 0 FSL (SUTURE) ×1 IMPLANT
SUT MON AB 0 CT1 (SUTURE) ×1 IMPLANT
SUT MON AB 2-0 SH 27 (SUTURE) ×4
SUT MON AB 2-0 SH27 (SUTURE) IMPLANT
SYR 30ML LL (SYRINGE) ×2 IMPLANT
SYRINGE 10CC LL (SYRINGE) ×2 IMPLANT
WAND 50 DEG COVAC W/CORD (SURGICAL WAND) IMPLANT
WAND 90 DEG TURBOVAC W/CORD (SURGICAL WAND) ×1 IMPLANT
WAND ARTHRO PARAGON T2 (SURGICAL WAND) IMPLANT
YANKAUER SUCT BULB TIP 10FT TU (MISCELLANEOUS) ×7 IMPLANT
YANKAUER SUCT BULB TIP NO VENT (SUCTIONS) ×2 IMPLANT

## 2011-08-17 NOTE — Anesthesia Preprocedure Evaluation (Signed)
Anesthesia Evaluation  Patient identified by MRN, date of birth, ID band Patient awake    Reviewed: Allergy & Precautions, H&P , NPO status , Patient's Chart, lab work & pertinent test results  Airway Mallampati: I TM Distance: >3 FB Neck ROM: Full    Dental No notable dental hx.    Pulmonary Current Smoker,    Pulmonary exam normal       Cardiovascular negative cardio ROS  Rhythm:Regular Rate:Normal     Neuro/Psych negative neurological ROS  negative psych ROS   GI/Hepatic negative GI ROS, Neg liver ROS,   Endo/Other  negative endocrine ROS  Renal/GU negative Renal ROS  negative genitourinary   Musculoskeletal negative musculoskeletal ROS (+)   Abdominal   Peds  Hematology negative hematology ROS (+)   Anesthesia Other Findings   Reproductive/Obstetrics negative OB ROS                           Anesthesia Physical Anesthesia Plan  ASA: II  Anesthesia Plan: General   Post-op Pain Management:    Induction: Intravenous  Airway Management Planned: LMA  Additional Equipment:   Intra-op Plan:   Post-operative Plan: Extubation in OR  Informed Consent: I have reviewed the patients History and Physical, chart, labs and discussed the procedure including the risks, benefits and alternatives for the proposed anesthesia with the patient or authorized representative who has indicated his/her understanding and acceptance.     Plan Discussed with: CRNA  Anesthesia Plan Comments:         Anesthesia Quick Evaluation

## 2011-08-17 NOTE — Progress Notes (Signed)
Right knee hair clipped 10 inches above and below knee.

## 2011-08-17 NOTE — Interval H&P Note (Signed)
History and Physical Interval Note:  08/17/2011 7:31 AM  Karen Mercer  has presented today for surgery, with the diagnosis of recurrent dislocation of right patella, patella instability  The various methods of treatment have been discussed with the patient and family. After consideration of risks, benefits and other options for treatment, the patient has consented to  Procedure(s) (LRB): ARTHROSCOPY KNEE (Right) and tibial tubercle transfer as a surgical intervention .  The patients' history has been reviewed, patient examined, no change in status, stable for surgery.  I have reviewed the patients' chart and labs.  Questions were answered to the patient's satisfaction.     [name]

## 2011-08-17 NOTE — Transfer of Care (Signed)
Immediate Anesthesia Transfer of Care Note  Patient: Karen Mercer  Procedure(s) Performed: Procedure(s) (LRB): ARTHROSCOPY KNEE (Right)  Patient Location: PACU  Anesthesia Type: General  Level of Consciousness: awake  Airway & Oxygen Therapy: Patient Spontanous Breathing and non-rebreather face mask  Post-op Assessment: Report given to PACU RN, Post -op Vital signs reviewed and stable and Patient moving all extremities  Post vital signs: Reviewed and stable  Complications: No apparent anesthesia complications

## 2011-08-17 NOTE — Anesthesia Postprocedure Evaluation (Signed)
Anesthesia Post Note  Patient: Karen Mercer  Procedure(s) Performed: Procedure(s) (LRB): ARTHROSCOPY KNEE (Right)  Anesthesia type: General  Patient location: PACU  Post pain: Pain level controlled  Post assessment: Post-op Vital signs reviewed, Patient's Cardiovascular Status Stable, Respiratory Function Stable, Patent Airway, No signs of Nausea or vomiting and Pain level controlled  Last Vitals:  Filed Vitals:   08/17/11 1019  BP: 130/71  Pulse: 67  Temp: 37.4 C  Resp: 16    Post vital signs: Reviewed and stable  Level of consciousness: awake and alert   Complications: No apparent anesthesia complications

## 2011-08-17 NOTE — Op Note (Signed)
08/17/2011  2:02 PM  PATIENT:  Karen Mercer  40 y.o. female  PRE-OPERATIVE DIAGNOSIS:  recurrent dislocation of right patella, patella instability  POST-OPERATIVE DIAGNOSIS:  recurrent dislocation of right patella, patella instability  PROCEDURE:  Procedure(s) (LRB): ARTHROSCOPY KNEE (Right), proximal realignment and distal realignment with tibial tubercle transfer (Lateral release with open medial reefing)   Findings the patella was grossly lax primarily laterally. Range of motion was full. Hyperextension of the knee measure 20 under anesthesia. Collateral ligaments anterior cruciate ligament and PCL are stable. Intra-articular findings there was grade 3 chondromalacia of the medial facet of the patella there was degeneration of the medial femoral condyle grade 2 there is also some clear cartilage abnormality grade 2 medial side intra-articular structures otherwise normal  The patient was identified in the preop area and the right knee was confirmed as surgical site marked. Chart was reviewed. The patient was taken to the operating room for general anesthesia. The limb was then prepped and draped sterilely. The OR team agreed on the timeout.  The scope was placed into the joint laterally and then into the patellofemoral joint diagnostic arthroscopy confirmed subluxation of the patella with lack of centering even at 60 flexion. Cartilaginous lesions as noted. Diagnostic arthroscopy completed. Joint surfaces normal medially and laterally  A lateral release was then performed with a 90 wand.   The leg was then exsanguinated with a six-inch Esmarch and the tourniquet was elevated to 300 mm of mercury. A lateral incision was made from the inferior pole of patella down across the tibia. The subcutaneous tissue was divided with full-thickness skin flap. The anterior compartment musculature was subperiosteally removed from the tibia with a osteotome until the posterior tibial cortex was  visible. The medial lateral aspects of the patellar tendon were identified. The Arthrex anteromedial a sedation guide was placed per manufacture a technique a 45 angle. A an oscillating saw was then used to make the osteotomy and this was completed with osteotomes. The tibial tubercle was then displaced 1.2 cm medially. A 3.2 drill bit was placed in the tibia to stabilize it and range of motion and tracking were checked. The tracking was best with the tibial tubercle at 1.0 cm medially displaced. This was then stabilized with 2, 4.5 cortical screws the proximal screw was placed with a washer. Each screw was countersunk as well.  Our attention then was turned to the previous site of medial reefing arthroscopic procedure had previously been done.  We opened this area with a full-thickness skin flap. The medial patellofemoral ligament was incised. 3 #2 Ethibond sutures were placed in mattress fashion and tensioned until tilt and subluxation were corrected. These were then tied with the knee in extension. Range of motion was then checked. The scope was placed back in the joint and tracking was then reevaluated. The subluxation and maltracking have been corrected.  Anterior compartment fasciotomy was then performed  The tourniquet was then released hemostasis was obtained. The lateral incision was closed with 2-0 Monocryl suture. The medial reefing type was closed with 0 and 2-0 Monocryl. The portal sites were closed with 3-0 nylon suture. 60 cc of Marcaine were divided between the incisions and the joint.  Staples were used to reapproximate the skin  Sterile dressing was applied along with a Cryo/Cuff which was activated. The patient was placed and a T score brace locked at 20 flexion.  SURGEON:  Surgeon(s) and Role:    * [name], MD - Primary  PHYSICIAN  ASSISTANT:   ASSISTANTS: betty ashley    ANESTHESIA:   general  EBL:  Total I/O In: 800 [I.V.:800] Out: 120 [Drains:70;  Blood:50]  BLOOD ADMINISTERED:none  DRAINS: large hemovac   LOCAL MEDICATIONS USED:  MARCAINE   With epi 60cc  SPECIMEN:  No Specimen  DISPOSITION OF SPECIMEN:  N/A  COUNTS:  YES  TOURNIQUET:   Total Tourniquet Time Documented: Thigh (Right) - 67 minutes  DICTATION: .Reubin Milan Dictation  PLAN OF CARE: outpatient with extended recovery   PATIENT DISPOSITION:  PACU - hemodynamically stable.   Delay start of Pharmacological VTE agent (>24hrs) due to surgical blood loss or risk of bleeding: not applicable

## 2011-08-17 NOTE — Anesthesia Procedure Notes (Signed)
Procedure Name: LMA Insertion Date/Time: 08/17/2011 7:46 AM Performed by: Franco Nones Pre-anesthesia Checklist: Patient identified, Patient being monitored, Emergency Drugs available, Timeout performed and Suction available Patient Re-evaluated:Patient Re-evaluated prior to inductionOxygen Delivery Method: Circle System Utilized Preoxygenation: Pre-oxygenation with 100% oxygen Intubation Type: IV induction Ventilation: Mask ventilation without difficulty LMA: LMA inserted LMA Size: 4.0 Number of attempts: 1 Placement Confirmation: positive ETCO2 and breath sounds checked- equal and bilateral

## 2011-08-17 NOTE — Interval H&P Note (Signed)
History and Physical Interval Note:  08/17/2011 7:30 AM  Karen Mercer  has presented today for surgery, with the diagnosis of recurrent dislocation of right patella, patella instability  The various methods of treatment have been discussed with the patient and family. After consideration of risks, benefits and other options for treatment, the patient has consented to  Procedure(s) (LRB): ARTHROSCOPY KNEE (Right) as a surgical intervention .  The patients' history has been reviewed, patient examined, no change in status, stable for surgery.  I have reviewed the patients' chart and labs.  Questions were answered to the patient's satisfaction.     [name]

## 2011-08-17 NOTE — Evaluation (Signed)
Physical Therapy Evaluation Patient Details Name: Karen Mercer MRN: [id number] DOB: 1971/04/14 Today's Date: 08/17/2011  Problem List:  Patient Active Problem List  Diagnoses  . SUBLUXATION PATELLAR (MALALIGNMENT)  . HEMARTHROSIS  . PATELLO-FEMORAL SYNDROME  . Patellar malalignment syndrome  . Patellar instability  . Recurrent dislocation of right patella  . Knee instability    Past Medical History: History reviewed. No pertinent past medical history. Past Surgical History:  Past Surgical History  Procedure Date  . Right arm     rods from fractured arm  . Tubal ligation   . Knee surgery 2012    right knee-arthroscopy    PT Assessment/Plan/Recommendation PT Assessment Clinical Impression Statement: Pt is a 41 year old female 8 hours s/p patellar disloction surgery.  She was able to demonstrate appropriate mechanics with the use of the RW.  Required supervision secondary to recently out of surgery as well as increased lines and leads.  Overall she is supervision with crutches.  Will be going home to a supportive family.   All education for gait training and exercises complete.  Pt is still drowsy from medications and did not comprehend many exercises today.  PT Recommendation/Assessment: Patent does not need any further PT services No Skilled PT: All education completed PT Recommendation Follow Up Recommendations: Outpatient PT Equipment Recommended: Other (comment) (Crutches (given by PT today)) PT Goals  Acute Rehab PT Goals PT Goal Formulation: With patient  PT Evaluation Precautions/Restrictions  Restrictions Weight Bearing Restrictions: Yes Prior Functioning  Home Living Lives With: Family Type of Home: House Home Access: Stairs to enter Secretary/administrator of Steps: 3 Home Layout: One level Home Adaptive Equipment: None Prior Function Level of Independence: Independent Able to Take Stairs?: Yes Driving: Yes Vocation: Full time employment Comments:  maintience work Designer, television/film set Level: Oriented X4 Sensation/Coordination   Extremity Assessment   Mobility (including Balance) Bed Mobility Bed Mobility: Yes Supine to Sit: 7: Independent Sit to Supine: 7: Independent Transfers Transfers: Yes Sit to Stand: 5: Supervision Sit to Stand Details (indicate cue type and reason): w/knee extension brace on.  Cueing for sequencing with crutches Stand to Sit: 5: Supervision Stand to Sit Details: cueing for sequencing w/crutches.  EOB<>stand<>sit EOB <> Stand <> commode for sequencing. Ambulation/Gait Ambulation/Gait: Yes Ambulation/Gait Assistance: 5: Supervision Ambulation/Gait Assistance Details (indicate cue type and reason): secondary to increased medication and patient reported dizziness and nausea Ambulation Distance (Feet): 10 Feet Assistive device: Crutches Gait Pattern: Step-to pattern Stairs: No Wheelchair Mobility Wheelchair Mobility: No    Exercise  General Exercises - Lower Extremity Quad Sets:  (Education) Straight Leg Raises:  (education) End of Session PT - End of Session Equipment Utilized During Treatment: Gait belt Activity Tolerance: Patient limited by fatigue (8 hours after surgery) Patient left: in bed;with call bell in reach;with family/visitor present General Behavior During Session: Lethargic Cognition: WFL for tasks performed Patient Class: Inpatient Irmalee Riemenschneider 08/17/2011, 3:18 PM

## 2011-08-17 NOTE — Brief Op Note (Signed)
08/17/2011  2:02 PM  PATIENT:  Lavena F Stanish  41 y.o. female  PRE-OPERATIVE DIAGNOSIS:  recurrent dislocation of right patella, patella instability  POST-OPERATIVE DIAGNOSIS:  recurrent dislocation of right patella, patella instability  PROCEDURE:  Procedure(s) (LRB): ARTHROSCOPY KNEE (Right), proximal realignment and distal realignment with tibial tubercle transfer (Lateral release with open medial reefing)   Findings the patella was grossly lax primarily laterally. Range of motion was full. Hyperextension of the knee measure 20 under anesthesia. Collateral ligaments anterior cruciate ligament and PCL are stable. Intra-articular findings there was grade 3 chondromalacia of the medial facet of the patella there was degeneration of the medial femoral condyle grade 2 there is also some clear cartilage abnormality grade 2 medial side intra-articular structures otherwise normal  The patient was identified in the preop area and the right knee was confirmed as surgical site marked. Chart was reviewed. The patient was taken to the operating room for general anesthesia. The limb was then prepped and draped sterilely. The OR team agreed on the timeout.  The scope was placed into the joint laterally and then into the patellofemoral joint diagnostic arthroscopy confirmed subluxation of the patella with lack of centering even at 60 flexion. Cartilaginous lesions as noted. Diagnostic arthroscopy completed. Joint surfaces normal medially and laterally  A lateral release was then performed with a 90 wand.   The leg was then exsanguinated with a six-inch Esmarch and the tourniquet was elevated to 300 mm of mercury. A lateral incision was made from the inferior pole of patella down across the tibia. The subcutaneous tissue was divided with full-thickness skin flap. The anterior compartment musculature was subperiosteally removed from the tibia with a osteotome until the posterior tibial cortex was  visible. The medial lateral aspects of the patellar tendon were identified. The Arthrex anteromedial a sedation guide was placed per manufacture a technique a 45 angle. A an oscillating saw was then used to make the osteotomy and this was completed with osteotomes. The tibial tubercle was then displaced 1.2 cm medially. A 3.2 drill bit was placed in the tibia to stabilize it and range of motion and tracking were checked. The tracking was best with the tibial tubercle at 1.0 cm medially displaced. This was then stabilized with 2, 4.5 cortical screws the proximal screw was placed with a washer. Each screw was countersunk as well.  Our attention then was turned to the previous site of medial reefing arthroscopic procedure had previously been done.  We opened this area with a full-thickness skin flap. The medial patellofemoral ligament was incised. 3 #2 Ethibond sutures were placed in mattress fashion and tensioned until tilt and subluxation were corrected. These were then tied with the knee in extension. Range of motion was then checked. The scope was placed back in the joint and tracking was then reevaluated. The subluxation and maltracking have been corrected.  Anterior compartment fasciotomy was then performed  The tourniquet was then released hemostasis was obtained. The lateral incision was closed with 2-0 Monocryl suture. The medial reefing type was closed with 0 and 2-0 Monocryl. The portal sites were closed with 3-0 nylon suture. 60 cc of Marcaine were divided between the incisions and the joint.  Staples were used to reapproximate the skin  Sterile dressing was applied along with a Cryo/Cuff which was activated. The patient was placed and a T score brace locked at 20 flexion.  SURGEON:  Surgeon(s) and Role:    * Kirin Pastorino E Deondray Ospina, MD - Primary  PHYSICIAN   ASSISTANT:   ASSISTANTS: betty ashley    ANESTHESIA:   general  EBL:  Total I/O In: 800 [I.V.:800] Out: 120 [Drains:70;  Blood:50]  BLOOD ADMINISTERED:none  DRAINS: large hemovac   LOCAL MEDICATIONS USED:  MARCAINE   With epi 60cc  SPECIMEN:  No Specimen  DISPOSITION OF SPECIMEN:  N/A  COUNTS:  YES  TOURNIQUET:   Total Tourniquet Time Documented: Thigh (Right) - 67 minutes  DICTATION: .Dragon Dictation  PLAN OF CARE: outpatient with extended recovery   PATIENT DISPOSITION:  PACU - hemodynamically stable.   Delay start of Pharmacological VTE agent (>24hrs) due to surgical blood loss or risk of bleeding: not applicable  

## 2011-08-18 DIAGNOSIS — M24469 Recurrent dislocation, unspecified knee: Secondary | ICD-10-CM

## 2011-08-18 MED ORDER — IBUPROFEN 800 MG PO TABS: 800.0000 mg | ORAL_TABLET | Freq: Three times a day (TID) | ORAL | Status: DC | PRN

## 2011-08-18 MED ORDER — METHOCARBAMOL 500 MG PO TABS: 500.0000 mg | ORAL_TABLET | Freq: Four times a day (QID) | ORAL | Status: DC

## 2011-08-18 MED ORDER — OXYCODONE HCL 5 MG PO TABS
5.0000 mg | ORAL_TABLET | ORAL | Status: DC
  Administered 2011-08-18: 5 mg via ORAL
  Filled 2011-08-18: qty 1

## 2011-08-18 MED ORDER — OXYCODONE-ACETAMINOPHEN 10-325 MG PO TABS: 1.0000 | ORAL_TABLET | ORAL | Status: DC | PRN

## 2011-08-18 MED ORDER — PROMETHAZINE HCL 12.5 MG PO TABS: 12.5000 mg | ORAL_TABLET | Freq: Four times a day (QID) | ORAL | Status: DC | PRN

## 2011-08-18 MED ORDER — IBUPROFEN 800 MG PO TABS
800.0000 mg | ORAL_TABLET | Freq: Three times a day (TID) | ORAL | Status: DC
  Administered 2011-08-18: 800 mg via ORAL
  Filled 2011-08-18: qty 1

## 2011-08-18 MED ORDER — OXYCODONE-ACETAMINOPHEN 5-325 MG PO TABS
1.0000 | ORAL_TABLET | ORAL | Status: DC
  Administered 2011-08-18: 1 via ORAL
  Filled 2011-08-18: qty 1

## 2011-08-18 NOTE — Progress Notes (Signed)
Pt taken out to the main entrance in wheelchair escorted by staff and family.  Pt assisted in to family members vehicle.

## 2011-08-18 NOTE — Progress Notes (Signed)
Subjective: 1 Day Post-Op Procedure(s) (LRB): ARTHROSCOPY KNEE (Right) Patient reports pain as 6 on 0-10 scale.    Objective: Vital signs in last 24 hours: Temp:  [97.7 F (36.5 C)-99.3 F (37.4 C)] 98 F (36.7 C) (04/13 0550) Pulse Rate:  [53-72] 62  (04/13 0550) Resp:  [11-20] 18  (04/13 0550) BP: (109-130)/(63-80) 118/76 mmHg (04/13 0550) SpO2:  [90 %-100 %] 99 % (04/13 0550) Weight:  [149 lb 14.6 oz (68 kg)] 149 lb 14.6 oz (68 kg) (04/13 0556)  Intake/Output from previous day: 04/12 0701 - 04/13 0700 In: 2296.3 [P.O.:240; I.V.:2056.3] Out: 260 [Drains:210; Blood:50] Intake/Output this shift:    No results found for this basename: HGB:5 in the last 72 hours No results found for this basename: WBC:2,RBC:2,HCT:2,PLT:2 in the last 72 hours No results found for this basename: NA:2,K:2,CL:2,CO2:2,BUN:2,CREATININE:2,GLUCOSE:2,CALCIUM:2 in the last 72 hours No results found for this basename: LABPT:2,INR:2 in the last 72 hours  Neurologically intact Sensation intact distally Intact pulses distally Dorsiflexion/Plantar flexion intact Compartment soft  Assessment/Plan: 1 Day Post-Op Procedure(s) (LRB): ARTHROSCOPY KNEE (Right) Up with therapy Switch to percocet  And ibuprofen  discharge  [name] 08/18/2011, 8:23 AM

## 2011-08-18 NOTE — Discharge Summary (Signed)
Physician Discharge Summary  Patient ID: Karen Mercer MRN: [id number] DOB/AGE: May 21, 1970 41 y.o.  Admit date: 08/17/2011 Discharge date: 08/18/2011  Admission Diagnoses: Recurrent dislocation and subluxation right patella  Discharge Diagnoses: Same Active Problems:  * No active hospital problems. *    Discharged Condition: good  Hospital Course: The patient was admitted on April 12 with recurrent dislocation subluxation right patella, with underwent uncomplicated arthroscopy of the right knee with lateral release open proximal medial reefing and anteromedial position of the tibial tubercle. She was admitted for pain control as Outpatient with extended recovery.  Postoperative day 1 she is afebrile her vital signs are stable her pain is under control the compartments are soft. She can dorsiflex and plantarflex the foot. There were no neurologic deficit or compromise.   Discharge Exam: Blood pressure 118/76, pulse 62, temperature 98 F (36.7 C), temperature source Oral, resp. rate 18, height 5\' 2"  (1.575 m), weight 68 kg (149 lb 14.6 oz), SpO2 99.00%. General appearance: alert, cooperative, appears stated age and no distress Neurologic: Mental status: Alert, oriented, thought content appropriate Sensory: normal Gait: Supportive by crutches weightbearing as tolerated  Disposition: 01-Home or Self Care  Discharge Orders    Future Appointments: Provider: Department: Dept Phone: Center:   08/21/2011 10:30 AM [name], MD [facility] [phone] [facility]     Future Orders Please Complete By Expires   Diet - low sodium heart healthy      Call MD / Call 911      Comments:   If you experience chest pain or shortness of breath, CALL 911 and be transported to the hospital emergency room.  If you develope a fever above 101 F, pus (white drainage) or increased drainage or redness at the wound, or calf pain, call your surgeon's office.   Constipation Prevention      Comments:   Drink plenty of fluids.  Prune juice may be helpful.  You may use a stool softener, such as Colace (over the counter) 100 mg twice a day.  Use MiraLax (over the counter) for constipation as needed.   Increase activity slowly as tolerated      Weight Bearing as taught in Physical Therapy      Comments:   Use a walker or crutches as instructed.   Driving restrictions      Comments:   No driving for 4 weeks   Lifting restrictions      Comments:   No lifting for 12 weeks   Discharge instructions      Comments:   Empty drain as needed  Activity Cryo/Cuff every 4 hours  Use crutches when walking  When walking places much weight on your foot as possible  When not walking elevate foot. Move ankle back-and-forth 20 times every 4 hours while awake       Medication List  As of 08/18/2011  8:56 AM   TAKE these medications         ibuprofen 800 MG tablet   Commonly known as: ADVIL,MOTRIN   Take 1 tablet (800 mg total) by mouth every 8 (eight) hours as needed. FOR PAIN      methocarbamol 500 MG tablet   Commonly known as: ROBAXIN   Take 1 tablet (500 mg total) by mouth 4 (four) times daily.      oxyCODONE-acetaminophen 10-325 MG per tablet   Commonly known as: PERCOCET   Take 1 tablet by mouth every 4 (four) hours as needed for pain.  promethazine 12.5 MG tablet   Commonly known as: PHENERGAN   Take 1 tablet (12.5 mg total) by mouth every 6 (six) hours as needed for nausea.         ASK your doctor about these medications         HYDROcodone-acetaminophen 5-325 MG per tablet   Commonly known as: NORCO   Take 1 tablet by mouth every 4 (four) hours as needed for pain.           Follow-up Information    Follow up with [name], MD. (Monday, April 15)    Contact information:   [address], [address] [place] [address] [phone]         Remove drain staples Monday and sutures from arthroscopy portals    Signed: [name] 08/18/2011, 8:56 AM

## 2011-08-18 NOTE — Progress Notes (Signed)
IV removed, site WNL.  Hemavac drain emptied prior to d/c.  Pt given d/c instructions and new prescriptions.  Discussed home care with patient and leg/ankle exercises and discussed home medications, patient verbalizes understanding. F/U appointment in place with Dr. Romeo Apple to have staples and drain removed, pt states they will keep appointments. Pt is stable at this time. Pt sent home with crutches. Able to demonstrate ambulating appropriately with crutches.

## 2011-08-18 NOTE — Anesthesia Postprocedure Evaluation (Signed)
  Anesthesia Post-op Note  Patient: Karen Mercer  Procedure(s) Performed: Procedure(s) (LRB): ARTHROSCOPY KNEE (Right)  Patient Location: room 326  Anesthesia Type: General  Level of Consciousness: awake, alert , oriented and patient cooperative  Airway and Oxygen Therapy: Patient Spontanous Breathing  Post-op Pain: 2 /10, mild  Post-op Assessment: Post-op Vital signs reviewed, Patient's Cardiovascular Status Stable, Respiratory Function Stable, Patent Airway, No signs of Nausea or vomiting and Pain level controlled  Post-op Vital Signs: Reviewed and stable  Complications: No apparent anesthesia complications

## 2011-08-18 NOTE — Addendum Note (Signed)
Addendum  created 08/18/11 1114 by Despina Hidden, CRNA   Modules edited:Notes Section

## 2011-08-20 ENCOUNTER — Encounter: Payer: Self-pay | Admitting: Orthopedic Surgery

## 2011-08-20 ENCOUNTER — Ambulatory Visit (INDEPENDENT_AMBULATORY_CARE_PROVIDER_SITE_OTHER): Payer: BC Managed Care – PPO | Admitting: Orthopedic Surgery

## 2011-08-20 VITALS — BP 140/70 | Ht 62.0 in | Wt 149.0 lb

## 2011-08-20 DIAGNOSIS — M24469 Recurrent dislocation, unspecified knee: Secondary | ICD-10-CM

## 2011-08-20 NOTE — Progress Notes (Signed)
Patient ID: Karen Mercer, female   DOB: 10-Sep-1970, 41 y.o.   MRN: [id number] Chief Complaint  Patient presents with  . Routine Post Op    post op 1 right knee DOS 08/17/11    Proximal and distal realignment with tibial tubercle transfer including medial reefing and lateral release.  Lateral release arthroscopic medial reefing was a revision open  And then open anteromedial sedation of the tibial tubercle  The patient is doing well except for some throbbing pain in the knee and shin area.  She did have an anterior compartment fasciotomy as well  Dressing was changed the staples are, on the 24th and we'll take x-rays at that time  Postoperative plan and regimen is noted in the discharge summary

## 2011-08-21 ENCOUNTER — Ambulatory Visit: Payer: BC Managed Care – PPO | Admitting: Orthopedic Surgery

## 2011-08-29 ENCOUNTER — Ambulatory Visit (INDEPENDENT_AMBULATORY_CARE_PROVIDER_SITE_OTHER): Payer: BC Managed Care – PPO | Admitting: Orthopedic Surgery

## 2011-08-29 ENCOUNTER — Encounter: Payer: Self-pay | Admitting: Orthopedic Surgery

## 2011-08-29 ENCOUNTER — Ambulatory Visit (INDEPENDENT_AMBULATORY_CARE_PROVIDER_SITE_OTHER): Payer: BC Managed Care – PPO

## 2011-08-29 VITALS — BP 104/80 | Ht 62.0 in | Wt 149.0 lb

## 2011-08-29 DIAGNOSIS — M24469 Recurrent dislocation, unspecified knee: Secondary | ICD-10-CM

## 2011-08-29 MED ORDER — OXYCODONE-ACETAMINOPHEN 7.5-325 MG PO TABS: 1.0000 | ORAL_TABLET | ORAL | Status: DC | PRN

## 2011-08-29 NOTE — Patient Instructions (Signed)
Brace  wbat with crutches  Start PT

## 2011-08-29 NOTE — Progress Notes (Signed)
Patient ID: Karen Mercer, female   DOB: August 20, 1970, 41 y.o.   MRN: [id number] Chief Complaint  Patient presents with  . Routine Post Op    post op2, day 12, staple removal right knee     Postop visit #2. Status post Fulkerson procedure with [facility] transfer of the tubercle and proximal reefing and lateral release.  She is doing well. X-rays today show fixation intact.  The patient will start physical therapy, continue brace full weightbearing with crutches.  Pain medication refilled followup in 4 weeks repeat x-ray

## 2011-09-05 ENCOUNTER — Encounter (HOSPITAL_COMMUNITY): Payer: Self-pay | Admitting: Orthopedic Surgery

## 2011-09-11 ENCOUNTER — Telehealth: Payer: Self-pay | Admitting: *Deleted

## 2011-09-11 ENCOUNTER — Ambulatory Visit (HOSPITAL_COMMUNITY)
Admission: RE | Admit: 2011-09-11 | Discharge: 2011-09-11 | Disposition: A | Discharge status: Home / Self Care | Payer: BC Managed Care – PPO | Source: Ambulatory Visit | Attending: Orthopedic Surgery | Admitting: Orthopedic Surgery

## 2011-09-11 DIAGNOSIS — M25569 Pain in unspecified knee: Secondary | ICD-10-CM | POA: Insufficient documentation

## 2011-09-11 DIAGNOSIS — R269 Unspecified abnormalities of gait and mobility: Secondary | ICD-10-CM | POA: Insufficient documentation

## 2011-09-11 DIAGNOSIS — M25669 Stiffness of unspecified knee, not elsewhere classified: Secondary | ICD-10-CM | POA: Insufficient documentation

## 2011-09-11 DIAGNOSIS — IMO0001 Reserved for inherently not codable concepts without codable children: Secondary | ICD-10-CM | POA: Insufficient documentation

## 2011-09-11 DIAGNOSIS — M6281 Muscle weakness (generalized): Secondary | ICD-10-CM | POA: Insufficient documentation

## 2011-09-11 NOTE — Evaluation (Signed)
Physical Therapy Evaluation  Patient Details  Name: Karen Mercer MRN: [id number] Date of Birth: 12-Dec-1970  Today's Date: 09/11/2011 Time: 4098-1191 PT Time Calculation (min): 43 min Charges: 1 eval Visit#: 1  of 12   Re-eval: 10/11/11 Assessment Diagnosis: R knee  Surgical Date: 08/17/11 Next MD Visit: Dr. Romeo Apple 09/26/11 Prior Therapy: None   Past Medical History: No past medical history on file. Past Surgical History:  Past Surgical History  Procedure Date  . Right arm     rods from fractured arm  . Tubal ligation   . Knee surgery 2012    right knee-arthroscopy  . Knee arthroscopy 08/17/2011    Procedure: ARTHROSCOPY KNEE;  Surgeon: [name], MD;  Location: [facility];  Service: Orthopedics;  Laterality: Right;  with tibia tubercle transfer and proximal realignment    Subjective Symptoms/Limitations Symptoms: Pt is referred to PT s/p R knee fulkerson amz w/proximal realignment after suffering with for about a year.  She reports that she initally had a scope, but continued to have subluxing patella afterwards.  her c/co is pain, ROM, strength, increased edema.  Alleviating factors: Pain Medication (1 every 4 hours) and ice and self massage.  Aggrevating Factors: Standing, Sitting .  Nature: "needles running down her leg", with sharp shooting that is intermittent How long can you sit comfortably?: less than 30 How long can you stand comfortably?: 10 minutes  How long can you walk comfortably?: 10 minutes Pain Assessment Currently in Pain?: Yes Pain Score:   6 (Pain Range: 0-10/10) Pain Location: Knee Pain Orientation: Right Pain Type: Acute pain  Precautions/Restrictions  Precautions Precautions: None Restrictions Weight Bearing Restrictions: Yes RLE Weight Bearing: Weight bearing as tolerated Other Position/Activity Restrictions: No restrictions according to referral or previous progress notes seen in EPIC  Prior Functioning  Home Living Lives With:  Significant other;Family Prior Function Level of Independence: Independent with gait;Independent with homemaking with ambulation Driving: Yes Vocation: Full time employment Vocation Requirements: 4-5 10 hour days.  Mostly standing,  Leisure: Hobbies-yes (Comment) Comments: She enjoys reading and playing with her nieces and nephews  Cognition/Observation Observation/Other Assessments Observations: Comes in today with T-scope on in locked position Other Assessments: significant atrophy to her R quadricep region with increased swelling. Unable to complete a R SLR  Sensation/Coordination/Flexibility/Functional Tests Functional Tests Functional Tests: LEFS: 10/80  Assessment RLE AROM (degrees) Right Knee Extension 0-130: 0  Right Knee Flexion 0-140: 74  RLE PROM (degrees) Right Knee Extension 0-130: 0  Right Knee Flexion 0-140: 82  RLE Strength Right Hip Flexion: 3/5 Right Hip Extension: 2+/5 Right Hip ABduction: 3+/5 Right Knee Flexion: 3/5 Right Knee Extension: 3/5 (increased pain) Palpation Palpation: Increased pain and tenderness proximal to patella.  Fascial restrcitions under incisions w/increased allodynia to incisions.   Mobility/Balance  Ambulation/Gait Ambulation/Gait: Yes Ambulation/Gait Assistance: 6: Modified independent (Device/Increase time) Assistive device: Other (Comment) (T-scope in locked position) Gait Pattern: Decreased hip/knee flexion - right;Right circumduction Static Standing Balance Static Standing - Comment/# of Minutes: maximum hold is 10 sec Single Leg Stance - Right Leg: 0  (attmepted with T-Scope on) Single Leg Stance - Left Leg: 10  Tandem Stance - Right Leg: 7  Tandem Stance - Left Leg: 10  Rhomberg - Eyes Opened: 10  Rhomberg - Eyes Closed: 10    Exercise/Treatments Supine Quad Sets: Right (3 reps, 10 sec hold) Short Arc Quad Sets: Right;5 reps Prone  Hamstring Curl: 10 reps Hip Extension: AAROM;Right;10 reps      Physical  Therapy Assessment and Plan PT Assessment and Plan Clinical Impression Statement: Pt is a 41 year old female referred to PT s/p R knee fulkerson amz w/proximal realignment on 08/17/11 which is limiting her ability to participate in household and work related actvities.  Pt will benefit from skilled therapeutic intervention in order to improve on the following deficits: Decreased activity tolerance;Decreased balance;Decreased coordination;Impaired perceived functional ability;Decreased strength;Decreased range of motion;Difficulty walking;Impaired flexibility;Pain;Decreased mobility;Other (comment);Increased fascial restricitons (Allodynia ) Rehab Potential: Good PT Frequency: Min 3X/week PT Duration: 8 weeks PT Treatment/Interventions: Gait training;Stair training;Functional mobility training;Therapeutic activities;Therapeutic exercise;Neuromuscular re-education;Patient/family education;Balance training;Other (comment) (Manual techniques and modalities for pain. ) PT Plan: Add ROM activities WITHOUT increasing pain above 2 levels including manual therapy.  Bike, Functional squats w/t-scope on in unlocked position, heel/toe raises, standing HS curls, 4 way SLR, prone knee flexion    Goals Home Exercise Program Pt will Perform Home Exercise Program: Independently PT Goal: Perform Home Exercise Program - Progress: Goal set today PT Short Term Goals Time to Complete Short Term Goals: 2 weeks PT Short Term Goal 1: pt will increase strength by 1/2 grade PT Short Term Goal 2: Pt will be able to ambulate independently x5 minutes proper mechanics PT Short Term Goal 3: Pt will increase R knee AROM 0-95  PT Short Term Goal 4: R SLS x10 sec on static surface w/T-scope in unlocked position.  PT Short Term Goal 5: Pt will report pain less than 3/10 for more than 50% of her day.  PT Long Term Goals Time to Complete Long Term Goals: 8 weeks PT Long Term Goal 1: Pt will improve her RLE strength to WNLto  independent ambulate x30 minutes PT Long Term Goal 2: Pt will report pain less than 3/10 for 75% of her day and improve her LEFS to 40/80 for improved QOL (currently 10/80) Long Term Goal 3: Pt will improve her dynamic balance and demonstrate independent ambulation on outdoor environment in order to participate in lesiure activities with her family.  Long Term Goal 4: Pt will improve her functional strength and R knee AROM 0-115 to ascend and descend stairs w/1 handrail.  PT Long Term Goal 5: Pt will improve her LE endurance and tolerate standing for at least 1 hour in order to return to job related activities.   Problem List Patient Active Problem List  Diagnoses  . SUBLUXATION PATELLAR (MALALIGNMENT)  . HEMARTHROSIS  . PATELLO-FEMORAL SYNDROME  . Patellar malalignment syndrome  . Patellar instability  . Recurrent dislocation of right patella  . Knee instability  . Knee pain  . Stiffness of joint, not elsewhere classified, lower leg  . Gait abnormality  . Muscle weakness (generalized)    PT - End of Session Activity Tolerance: Patient tolerated treatment well PT Plan of Care PT Home Exercise Plan: see scanned report PT Patient Instructions: importance of HEP and goals of PT, importance of desensation techniques to decrease allodynia Consulted and Agree with Plan of Care: Patient  Vesna Kable 09/11/2011, 4:20 PM  Physician Documentation Your signature is required to indicate approval of the treatment plan as stated above.  Please sign and either send electronically or make a copy of this report for your files and return this physician signed original.   Please mark one 1.__approve of plan  2. ___approve of plan with the following conditions.   ______________________________  _____________________ Physician Signature                                                                                                              Date

## 2011-09-12 NOTE — Telephone Encounter (Signed)
Yes

## 2011-09-13 NOTE — Telephone Encounter (Signed)
Karen Mercer at PT

## 2011-09-13 NOTE — Telephone Encounter (Signed)
Called PT left message to return call

## 2011-09-18 ENCOUNTER — Ambulatory Visit (HOSPITAL_COMMUNITY)
Admission: RE | Admit: 2011-09-18 | Discharge: 2011-09-18 | Disposition: A | Discharge status: Home / Self Care | Payer: BC Managed Care – PPO | Source: Ambulatory Visit | Attending: Internal Medicine | Admitting: Internal Medicine

## 2011-09-18 NOTE — Progress Notes (Signed)
Physical Therapy Treatment Patient Details  Name: Karen Mercer MRN: [id number] Date of Birth: 1970-12-26  Today's Date: 09/18/2011 Time: 1640-1730 PT Time Calculation (min): 50 min  Visit#: 2  of 12   Re-eval: 10/11/11  Charge: therex 50 min  Subjective: Symptoms/Limitations Symptoms: Pt stated "nagging pain" with knee movements, pain scale 6/10 today. Pain Assessment Currently in Pain?: Yes Pain Score:   6 Pain Location: Knee Pain Orientation: Right  Objective:   Exercise/Treatments Aerobic Stationary Bike: 6' @ 2.0 for strengthening/ROM Standing Heel Raises: 15 reps;Limitations Heel Raises Limitations: toe raises 15 reps Knee Flexion: 15 reps Functional Squat: 10 reps;Limitations Functional Squat Limitations: cueing for increase weight bearing R LE SLS: R 12", L 24" max of 3 Other Standing Knee Exercises: tandem stance 2x 30" with intermitternt HHA Supine Quad Sets: Right;10 reps;Limitations Quad Sets Limitations: 10 sec holds Short Arc [employer] Sets: Right;10 reps;Limitations Water quality scientist Limitations: 10 sec holds Straight Leg Raises: 10 reps Sidelying Hip ABduction: 10 reps Hip ADduction: 10 reps Prone  Hamstring Curl: 10 reps Hip Extension: 10 reps      Physical Therapy Assessment and Plan PT Assessment and Plan Clinical Impression Statement: Began exercises per PT POC with focus on knee strengthening.  Pt tolerated well to new activities though did c/o pain increase 2 levels with standing balance activities.  Pt able to demonstrate correct technique with all therex following cues.  Good quad contractions noted but fatigues fast with increased reps.  Pt wtih most difficulty with hip flexion due to weakness.   PT Plan: Continue with current POC, begin heel slides next session with gentle PROM (do not increase knee pain more than 2 levels) and manual patella, tib/fib mobs for pain reduction.    Goals    Problem List Patient Active Problem List    Diagnoses  . SUBLUXATION PATELLAR (MALALIGNMENT)  . HEMARTHROSIS  . PATELLO-FEMORAL SYNDROME  . Patellar malalignment syndrome  . Patellar instability  . Recurrent dislocation of right patella  . Knee instability  . Knee pain  . Stiffness of joint, not elsewhere classified, lower leg  . Gait abnormality  . Muscle weakness (generalized)    PT - End of Session Activity Tolerance: Patient tolerated treatment well General Behavior During Session: [facility] for tasks performed Cognition: [facility] for tasks performed  GP No functional reporting required  Juel Burrow, PTA 09/18/2011, 6:12 PM

## 2011-09-20 ENCOUNTER — Ambulatory Visit (HOSPITAL_COMMUNITY): Payer: BC Managed Care – PPO

## 2011-09-20 ENCOUNTER — Inpatient Hospital Stay (HOSPITAL_COMMUNITY): Admission: RE | Admit: 2011-09-20 | Payer: BC Managed Care – PPO | Source: Ambulatory Visit

## 2011-09-24 ENCOUNTER — Ambulatory Visit (HOSPITAL_COMMUNITY)
Admission: RE | Admit: 2011-09-24 | Discharge: 2011-09-24 | Disposition: A | Discharge status: Home / Self Care | Payer: BC Managed Care – PPO | Source: Ambulatory Visit | Attending: Orthopedic Surgery | Admitting: Orthopedic Surgery

## 2011-09-24 NOTE — Progress Notes (Signed)
Physical Therapy Treatment Patient Details  Name: Karen Mercer MRN: [id number] Date of Birth: 1970-06-19  Today's Date: 09/24/2011 Time: 2595-6387 PT Time Calculation (min): 47 min Charges: 30' TE, 1 Estim, 1 ice Visit#: 3  of 12   Re-eval: 10/11/11    Subjective: Symptoms/Limitations Symptoms: Pt reports she is doing okay today.  She states yesterday she had a lot of pain that she contributes to the rain.  Pain Assessment Currently in Pain?: Yes Pain Score:   5 Pain Location: Knee Pain Orientation: Right  Exercise/Treatments Aerobic Stationary Bike: 6' @ 2.0 for strengthening/ROM Standing Heel Raises: 20 reps Heel Raises Limitations: toe raises 20 reps Knee Flexion: 20 reps Lateral Step Up: Right;5 reps;Hand Hold: 2;Step Height: 4";Limitations Lateral Step Up Limitations: TC for proper posture Forward Step Up: Right;10 reps;Hand Hold: 1;Step Height: 4" Functional Squat: 15 reps Functional Squat Limitations: cueing for increase weight bearing R LE Rocker Board: 1 minute;Other (comment) (R<>L) Gait Training: around the gym w/VC for proper foot and knee mechanics x5' Seated Long Arc Quad: AROM;10 reps;Weights Long Arc Quad Weight: 2 lbs.  Modalities Modalities: Cryotherapy;Electrical Stimulation Manual Therapy Manual Therapy: Joint mobilization Joint Mobilization: Grade I-II to R knee joint and proximal tib fib joint and patellar mobs Cryotherapy Number Minutes Cryotherapy: 10 Minutes Cryotherapy Location: Knee Type of Cryotherapy: Ice pack Pharmacologist Location: IFES to R anterior knee to decrease pain to patella x10 min w/ice Electrical Stimulation Parameters: Beat: 150/80 sweep on, 18.0 Volts Electrical Stimulation Goals: Pain  Physical Therapy Assessment and Plan PT Assessment and Plan Clinical Impression Statement: treatment focused on improving functional strength and improving gait mechanics. Pt had significant improvment  with gait mechanics after cuing today, had mild increase in pain with exercises.   Had decreased pain after manual and e-stim PT Plan: Cont to address activities to reach goals    Problem List Patient Active Problem List  Diagnoses  . SUBLUXATION PATELLAR (MALALIGNMENT)  . HEMARTHROSIS  . PATELLO-FEMORAL SYNDROME  . Patellar malalignment syndrome  . Patellar instability  . Recurrent dislocation of right patella  . Knee instability  . Knee pain  . Stiffness of joint, not elsewhere classified, lower leg  . Gait abnormality  . Muscle weakness (generalized)    PT - End of Session Activity Tolerance: Patient tolerated treatment well   Ramez Arrona 09/24/2011, 3:38 PM

## 2011-09-26 ENCOUNTER — Ambulatory Visit (HOSPITAL_COMMUNITY): Payer: BC Managed Care – PPO

## 2011-09-27 ENCOUNTER — Ambulatory Visit (INDEPENDENT_AMBULATORY_CARE_PROVIDER_SITE_OTHER): Payer: BC Managed Care – PPO

## 2011-09-27 ENCOUNTER — Ambulatory Visit (INDEPENDENT_AMBULATORY_CARE_PROVIDER_SITE_OTHER): Payer: BC Managed Care – PPO | Admitting: Orthopedic Surgery

## 2011-09-27 ENCOUNTER — Encounter: Payer: Self-pay | Admitting: Orthopedic Surgery

## 2011-09-27 ENCOUNTER — Telehealth: Payer: Self-pay | Admitting: Orthopedic Surgery

## 2011-09-27 VITALS — BP 108/64 | Ht 62.0 in | Wt 149.0 lb

## 2011-09-27 DIAGNOSIS — Z9889 Other specified postprocedural states: Secondary | ICD-10-CM

## 2011-09-27 DIAGNOSIS — M2201 Recurrent dislocation of patella, right knee: Secondary | ICD-10-CM

## 2011-09-27 DIAGNOSIS — M24469 Recurrent dislocation, unspecified knee: Secondary | ICD-10-CM

## 2011-09-27 MED ORDER — OXYCODONE-ACETAMINOPHEN 7.5-325 MG PO TABS: 1.0000 | ORAL_TABLET | ORAL | Status: DC | PRN

## 2011-09-27 NOTE — Telephone Encounter (Signed)
Patient called back, states cannot find brace; therefore will need one.  Please advise.  Her ph# [phone].

## 2011-09-27 NOTE — Patient Instructions (Addendum)
Continue bracing with patella brace and use for PT  Continue PT

## 2011-09-27 NOTE — Progress Notes (Signed)
Patient ID: Karen Mercer, female   DOB: 04-22-1971, 41 y.o.   MRN: [id number] Chief Complaint  Patient presents with  . Follow-up    4 week follow up and xray right knee, DOS 08/17/11   BP 108/64  Ht 5\' 2"  (1.575 m)  Wt 149 lb (67.586 kg)  BMI 27.25 kg/m2  LMP 09/27/2011  Six-week followup Fulkerson anteromialization of the right patella with proximal realignment and reefing of the medial patellofemoral ligament  Doing well except when it rains  Mild knee effusion is noted  Incisions are healed well.  Patella feels stable.  X-rays look good  Followup 6 weeks continue physical therapy transition to patellofemoral stabilizer brace remove range of motion brace.

## 2011-09-28 ENCOUNTER — Ambulatory Visit (HOSPITAL_COMMUNITY): Payer: BC Managed Care – PPO

## 2011-09-28 NOTE — Telephone Encounter (Signed)
Called back to patient, relayed. 

## 2011-09-28 NOTE — Telephone Encounter (Signed)
KEEP CURRENT BRACE ON UNTIL TUES THEN COME IN FOR BRACE   NON APPOINTMENT

## 2011-10-02 ENCOUNTER — Inpatient Hospital Stay (HOSPITAL_COMMUNITY): Admission: RE | Admit: 2011-10-02 | Payer: BC Managed Care – PPO | Source: Ambulatory Visit

## 2011-10-03 ENCOUNTER — Ambulatory Visit (HOSPITAL_COMMUNITY): Payer: BC Managed Care – PPO

## 2011-10-03 ENCOUNTER — Ambulatory Visit (HOSPITAL_COMMUNITY): Payer: BC Managed Care – PPO | Admitting: Physical Therapy

## 2011-10-05 ENCOUNTER — Ambulatory Visit (HOSPITAL_COMMUNITY): Payer: BC Managed Care – PPO

## 2011-10-18 ENCOUNTER — Telehealth: Payer: Self-pay | Admitting: Orthopedic Surgery

## 2011-10-18 NOTE — Telephone Encounter (Signed)
Karen Mercer is asking for a new prescription for Oxycodone 7.5

## 2011-10-21 NOTE — Telephone Encounter (Signed)
After Thursday so it must wait till monday

## 2011-10-22 NOTE — Telephone Encounter (Signed)
Karen Mercer, will you remind Dr. Romeo Apple of this?  Thanks

## 2011-10-23 NOTE — Telephone Encounter (Signed)
The patient called back today about this prescription request

## 2011-10-24 ENCOUNTER — Other Ambulatory Visit: Payer: Self-pay | Admitting: Orthopedic Surgery

## 2011-10-24 DIAGNOSIS — Z9889 Other specified postprocedural states: Secondary | ICD-10-CM

## 2011-10-24 MED ORDER — OXYCODONE-ACETAMINOPHEN 5-325 MG PO TABS: 1.0000 | ORAL_TABLET | ORAL | Status: AC | PRN

## 2011-10-24 NOTE — Telephone Encounter (Signed)
Patient aware to pickup prescription 

## 2011-10-25 ENCOUNTER — Ambulatory Visit (HOSPITAL_COMMUNITY): Payer: BC Managed Care – PPO | Admitting: Physical Therapy

## 2011-10-31 ENCOUNTER — Ambulatory Visit (HOSPITAL_COMMUNITY)
Admission: RE | Admit: 2011-10-31 | Discharge: 2011-10-31 | Disposition: A | Discharge status: Home / Self Care | Payer: BC Managed Care – PPO | Source: Ambulatory Visit | Attending: Internal Medicine | Admitting: Internal Medicine

## 2011-10-31 DIAGNOSIS — R29898 Other symptoms and signs involving the musculoskeletal system: Secondary | ICD-10-CM | POA: Insufficient documentation

## 2011-10-31 DIAGNOSIS — M25569 Pain in unspecified knee: Secondary | ICD-10-CM | POA: Insufficient documentation

## 2011-10-31 DIAGNOSIS — R269 Unspecified abnormalities of gait and mobility: Secondary | ICD-10-CM | POA: Insufficient documentation

## 2011-10-31 DIAGNOSIS — M25561 Pain in right knee: Secondary | ICD-10-CM | POA: Insufficient documentation

## 2011-10-31 DIAGNOSIS — IMO0001 Reserved for inherently not codable concepts without codable children: Secondary | ICD-10-CM | POA: Insufficient documentation

## 2011-10-31 DIAGNOSIS — R262 Difficulty in walking, not elsewhere classified: Secondary | ICD-10-CM | POA: Insufficient documentation

## 2011-10-31 DIAGNOSIS — M6281 Muscle weakness (generalized): Secondary | ICD-10-CM | POA: Insufficient documentation

## 2011-10-31 NOTE — Evaluation (Addendum)
Physical Therapy Evaluation  Patient Details  Name: Karen Mercer MRN: [id number] Date of Birth: 1970/05/12  Today's Date: 10/31/2011 Time: 1515-1600 PT Time Calculation (min): 45 min  Visit#: 1  of 8   Re-eval: 11/30/11 Assessment Diagnosis: R knee  Surgical Date: 08/17/11 Next MD Visit: Dr. Romeo Apple 09/26/11 Prior Therapy: None  Authorization: BCBS   Past Medical History: No past medical history on file. Past Surgical History:  Past Surgical History  Procedure Date  . Right arm     rods from fractured arm  . Tubal ligation   . Knee surgery 2012    right knee-arthroscopy  . Knee arthroscopy 08/17/2011    Procedure: ARTHROSCOPY KNEE;  Surgeon: [name], MD;  Location: [facility];  Service: Orthopedics;  Laterality: Right;  with tibia tubercle transfer and proximal realignment    Subjective Symptoms/Limitations Symptoms: Ms. Halpin states that she has surgery on her right knee.  She had therpy for three treatments and then she did not return to therapy; she is now being re-referred to therapy.  She states that she has increased pain when it rains or when she has to stand on her leg for greater than fifteen minutes. How long can you sit comfortably?: The patient states that if she sits for more than 15-20 minutes she will be very stiff when she gets up. How long can you stand comfortably?: She is only able to stand for about ten minutes.   How long can you walk comfortably?: The patient states that if she walks more than three minutes she has throbbing.   Special Tests: The patient states that she is waking up every hour on the hour Patient Stated Goals: to have less pain. Pain Assessment Currently in Pain?: Yes Pain Score:   6 (worst pain 12/10; best 3/10) Pain Location: Knee Pain Orientation: Right Pain Type: Surgical pain Pain Onset: 1 to 4 weeks ago Pain Frequency: Constant (various )  Precautions/Restrictions  Precautions Precaution Comments: PT states MD  wants her to do ex in neoprene brace. Restrictions Weight Bearing Restrictions: Yes RLE Weight Bearing: Weight bearing as tolerated Other Position/Activity Restrictions: No restrictions according to referral or previous progress notes seen in EPIC  Prior Functioning  Home Living Lives With: Significant other;Family Prior Function Level of Independence: Independent with gait;Independent with homemaking with ambulation Driving: Yes Vocation: Full time employment Vocation Requirements: 4-5 10 hour days.  Mostly standing,  Leisure: Hobbies-yes (Comment)   Sensation/Coordination/Flexibility/Functional Tests Functional Tests Functional Tests: LEFS 22  Assessment RLE AROM (degrees) Right Knee Extension: 0  Right Knee Flexion: 74  RLE PROM (degrees) Right Knee Extension: 0  Right Knee Flexion: 130  RLE Strength Right Hip Flexion: 3/5 Right Hip Extension: 3/5 Right Hip ABduction: 3+/5 Right Hip ADduction: 3+/5 Right Knee Flexion: 3+/5 Right Knee Extension: 3/5 (increased pain) Right Ankle Dorsiflexion: 4/5 Palpation Palpation: Increased pain and tenderness proximal to patella.  Fascial restrcitions under incisions w/increased allodynia to incisions.   Exercise/Treatments Mobility/Balance    Static Standing Balance Single Leg Stance - Right Leg: 5  Single Leg Stance - Left Leg: 26  Tandem Stance - Right Leg: 18  Tandem Stance - Left Leg: 27    Supine Quad Sets: 10 reps Straight Leg Raises: 10 reps Sidelying Hip ABduction: 10 reps;Limitations Hip ABduction Limitations: 3# Hip ADduction: 10 reps Prone  Hamstring Curl: 10 reps;Limitations Hamstring Curl Limitations: 3# Hip Extension: 10 reps    Physical Therapy Assessment and Plan PT Assessment and Plan Clinical Impression  Statement: Pt with decreased strength and increased pain affecting mobility who will benefit from skilled PT to return pt to prior functional level. Pt will benefit from skilled therapeutic  intervention in order to improve on the following deficits: Decreased activity tolerance;Pain;Decreased balance;Decreased strength;Difficulty walking;Impaired perceived functional ability PT Frequency: Min 2X/week PT Duration: 4 weeks PT Treatment/Interventions: Gait training;Modalities;Therapeutic activities;Therapeutic exercise PT Plan: begin heel raises, rocker board, minisquats, terminal extension, cybex leg press, rebounder, standing knee flex with wt next treatment.    Goals Home Exercise Program Pt will Perform Home Exercise Program: Independently PT Goal: Perform Home Exercise Program - Progress: Goal set today PT Short Term Goals Time to Complete Short Term Goals: 2 weeks PT Short Term Goal 1: increase mm grade by 1/2 grade PT Short Term Goal 2: Pt to be able to ambulate for fifteen  minutes without increase pain PT Short Term Goal 3: LEFS increased by 5 PT Short Term Goal 4: Able to stand for 10 minutes without increased pain PT Long Term Goals Time to Complete Long Term Goals: 4 weeks PT Long Term Goal 1: Strentgth to be increased one grade to be able to wallk without neoprene brace. PT Long Term Goal 2: Pain to be less than 3/10 75% of the day Long Term Goal 3: Able to walk for an hour in order to be able to go shopping Long Term Goal 4: able to sit for an hour and a half in order to travel or go to the movies PT Long Term Goal 5: Pt to be able to tolerate standing for an hour.  Problem List Patient Active Problem List  Diagnosis  . SUBLUXATION PATELLAR (MALALIGNMENT)  . HEMARTHROSIS  . PATELLO-FEMORAL SYNDROME  . Patellar malalignment syndrome  . Patellar instability  . Recurrent dislocation of right patella  . Knee instability  . Knee pain  . Stiffness of joint, not elsewhere classified, lower leg  . Gait abnormality  . Muscle weakness (generalized)  . Difficulty in walking  . Pain in right knee  . Weakness of right leg    PT - End of Session Activity  Tolerance: Patient tolerated treatment well General Behavior During Session: [facility] for tasks performed Cognition: [facility] for tasks performed PT Plan of Care PT Home Exercise Plan: given Consulted and Agree with Plan of Care: Patient   Holley Kocurek,CINDY 10/31/2011, 4:06 PM  Physician Documentation Your signature is required to indicate approval of the treatment plan as stated above.  Please sign and either send electronically or make a copy of this report for your files and return this physician signed original.   Please mark one 1.__approve of plan  2. ___approve of plan with the following conditions.   ______________________________                                                          _____________________ Physician Signature  Date  

## 2011-11-07 ENCOUNTER — Encounter: Payer: Self-pay | Admitting: Orthopedic Surgery

## 2011-11-07 ENCOUNTER — Ambulatory Visit (INDEPENDENT_AMBULATORY_CARE_PROVIDER_SITE_OTHER): Payer: BC Managed Care – PPO | Admitting: Orthopedic Surgery

## 2011-11-07 ENCOUNTER — Ambulatory Visit (INDEPENDENT_AMBULATORY_CARE_PROVIDER_SITE_OTHER): Payer: BC Managed Care – PPO

## 2011-11-07 VITALS — BP 144/80 | Ht 62.0 in | Wt 140.0 lb

## 2011-11-07 DIAGNOSIS — M25569 Pain in unspecified knee: Secondary | ICD-10-CM

## 2011-11-07 DIAGNOSIS — Z9889 Other specified postprocedural states: Secondary | ICD-10-CM

## 2011-11-07 MED ORDER — OXYCODONE-ACETAMINOPHEN 5-325 MG PO TABS: 1.0000 | ORAL_TABLET | ORAL | Status: AC | PRN

## 2011-11-07 NOTE — Progress Notes (Signed)
Patient ID: Karen Mercer, female   DOB: [birth year], 41 y.o.   MRN: [id number] Chief Complaint  Patient presents with  . Follow-up    6 week follow up/ xray Rt knee, DOS 08/17/11    BP 144/80  Ht 5\' 2"  (1.575 m)  Wt 140 lb (63.504 kg)  BMI 25.61 kg/m2  LMP 11/02/2011  Postoperative week number #12 size tibial tubercle Fulkerson procedure.  Doing well except for some mild ache in the front of the knee and also over the pins anserine bursa.  She has some tenderness over the bursa and over the front of the knee at the incision site.  There is no swelling of the knee. The joint looks good. She has full range of motion she can do a straight leg raise is no extensor lag. She does have a stable knee cap  Her x-ray looks good.  Recommend Bier freeze, avoid bicycling for now.Continue Percocet 5 mg for pain and take 10 mg of prednisone twice a day for 10 days

## 2011-11-07 NOTE — Patient Instructions (Addendum)
Wear patella stabilizer brace   Increase activity as tolerated   Get BIOFREEZE OTC apply 3 x a day

## 2011-11-12 ENCOUNTER — Ambulatory Visit (HOSPITAL_COMMUNITY)
Admission: RE | Admit: 2011-11-12 | Discharge: 2011-11-12 | Disposition: A | Discharge status: Home / Self Care | Payer: BC Managed Care – PPO | Source: Ambulatory Visit | Attending: Internal Medicine | Admitting: Internal Medicine

## 2011-11-12 DIAGNOSIS — M6281 Muscle weakness (generalized): Secondary | ICD-10-CM | POA: Insufficient documentation

## 2011-11-12 DIAGNOSIS — M25569 Pain in unspecified knee: Secondary | ICD-10-CM | POA: Insufficient documentation

## 2011-11-12 DIAGNOSIS — R269 Unspecified abnormalities of gait and mobility: Secondary | ICD-10-CM | POA: Insufficient documentation

## 2011-11-12 DIAGNOSIS — IMO0001 Reserved for inherently not codable concepts without codable children: Secondary | ICD-10-CM | POA: Insufficient documentation

## 2011-11-12 NOTE — Progress Notes (Signed)
Physical Therapy Treatment Patient Details  Name: Karen Mercer MRN: [id number] Date of Birth: 1971-03-25  Today's Date: 11/12/2011 Time: 6962-9528 PT Time Calculation (min): 30 min  Visit#: 2  of 8   Re-eval: 11/30/11 Diagnosis: R knee  Surgical Date: 08/17/11 Next MD Visit: Dr. Romeo Apple 12/19/11 Authorization: BCBS  Charges:  therex 28'  Subjective: Symptoms/Limitations Symptoms: Pt. states her pain is 7/10 today.  States her MD does not want her riding the stationary bike. Pain Assessment Currently in Pain?: Yes Pain Score:   7 Pain Location: Knee Pain Orientation: Right  Precautions/Restrictions  Precautions Precaution Comments: No BIKE, MD wants her to do exercise in neoprene brace  Exercise/Treatments Aerobic Stationary Bike: D/C Machines for Strengthening Cybex Leg Press: 1 PL R LE only 10 reps Standing Heel Raises: 15 reps Heel Raises Limitations: toe raises 15 reps Knee Flexion: 15 reps;Limitations Knee Flexion Limitations: 3# Functional Squat: 10 reps Functional Squat Limitations: cues for form Rocker Board: 2 minutes SLS: R 15", L 50" max of 3 Seated Long Arc Quad: 10 reps Long Arc Quad Weight: 4 lbs. Supine Straight Leg Raises: 10 reps Sidelying Hip ABduction: 15 reps Hip ABduction Limitations: 3# Hip ADduction: 15 reps Prone  Hamstring Curl: 15 reps;Limitations Hamstring Curl Limitations: 3# Hip Extension: 15 reps     Physical Therapy Assessment and Plan PT Assessment and Plan Clinical Impression Statement: Pt. c/o slight increase of pain at end of session but reports usually declines after therapy.  Added new exercise per PT POC all without difficulty.  Good control with rockerboard, however requires VC/tactile cues to perform squats correctly. PT Plan: Continue to progress strength.  Add step ups/downs next visit.  Progress to rebounder when able to complete 1' with R SLS.     Problem List Patient Active Problem List  Diagnosis  .  SUBLUXATION PATELLAR (MALALIGNMENT)  . HEMARTHROSIS  . PATELLO-FEMORAL SYNDROME  . Patellar malalignment syndrome  . Patellar instability  . Recurrent dislocation of right patella  . Knee instability  . Knee pain  . Stiffness of joint, not elsewhere classified, lower leg  . Gait abnormality  . Muscle weakness (generalized)  . Difficulty in walking  . Pain in right knee  . Weakness of right leg    PT - End of Session Activity Tolerance: Patient tolerated treatment well General Behavior During Session: [facility] for tasks performed Cognition: [facility] for tasks performed   Lurena Nida, PTA/CLT 11/12/2011, 4:41 PM

## 2011-11-14 ENCOUNTER — Ambulatory Visit (HOSPITAL_COMMUNITY): Payer: BC Managed Care – PPO | Admitting: *Deleted

## 2011-11-19 ENCOUNTER — Ambulatory Visit (HOSPITAL_COMMUNITY)
Admission: RE | Admit: 2011-11-19 | Discharge: 2011-11-19 | Disposition: A | Discharge status: Home / Self Care | Payer: BC Managed Care – PPO | Source: Ambulatory Visit | Attending: Physical Therapy | Admitting: Physical Therapy

## 2011-11-19 NOTE — Progress Notes (Signed)
Physical Therapy Treatment Patient Details  Name: Karen Mercer MRN: [id number] Date of Birth: [birth year]  Today's Date: 11/19/2011 Time: 4098-1191 PT Time Calculation (min): 39 min Visit#: 3  of 8   Re-eval: 11/30/11 Charges:  therex 38'   Subjective: Symptoms/Limitations Symptoms: Pt. states her pain is down today, 4/10 (was 7/10 on 7/8). Pain Assessment Currently in Pain?: Yes Pain Score:   4 Pain Location: Knee Pain Orientation: Right   Exercise/Treatments Machines for Strengthening Cybex Leg Press: 1 PL R LE only 2 sets 10 reps Standing Heel Raises: 20 reps Heel Raises Limitations: toe raises 20 reps Knee Flexion: 15 reps;Limitations Knee Flexion Limitations: 3# Lateral Step Up: 10 reps;Right;Hand Hold: 1 Lateral Step Up Limitations: TC for proper posture Forward Step Up: Right;10 reps;Hand Hold: 1;Step Height: 4" Step Down: 10 reps;Hand Hold: 1;Step Height: 4" Functional Squat: 10 reps Functional Squat Limitations: cues for form Rocker Board: 2 minutes SLS: R 33", L 50" max of 3 SLS with Vectors: add next visit Seated Long Arc Quad: 15 reps Long Arc Quad Weight: 4 lbs. Supine Straight Leg Raises: 15 reps Sidelying Hip ABduction: 15 reps Hip ABduction Limitations: 3# Hip ADduction: 15 reps Prone  Hamstring Curl: 15 reps;Limitations Hamstring Curl Limitations: 3# Hip Extension: 15 reps;Limitations Hip Extension Limitations: 3#     Physical Therapy Assessment and Plan PT Assessment and Plan Clinical Impression Statement: Improved form with squats today with overall less pain.  Increased reps/added weight to prone SLR without difficulty.  Resumed step ups with tactile cues to decrease hip substitution.  Improved SLS time today PT Plan: Progress to rebounder when able to complete 1' with R SLS.  Add vector stance to R LE next visit.     Problem List Patient Active Problem List  Diagnosis  . SUBLUXATION PATELLAR (MALALIGNMENT)  . HEMARTHROSIS  .  PATELLO-FEMORAL SYNDROME  . Patellar malalignment syndrome  . Patellar instability  . Recurrent dislocation of right patella  . Knee instability  . Knee pain  . Stiffness of joint, not elsewhere classified, lower leg  . Gait abnormality  . Muscle weakness (generalized)  . Difficulty in walking  . Pain in right knee  . Weakness of right leg    PT - End of Session Activity Tolerance: Patient tolerated treatment well General Behavior During Session: [facility] for tasks performed Cognition: [facility] for tasks performed   Lurena Nida, PTA/CLT 11/19/2011, 4:44 PM

## 2011-11-21 ENCOUNTER — Ambulatory Visit (HOSPITAL_COMMUNITY): Payer: BC Managed Care – PPO | Admitting: *Deleted

## 2011-11-26 ENCOUNTER — Ambulatory Visit (HOSPITAL_COMMUNITY)
Admission: RE | Admit: 2011-11-26 | Discharge: 2011-11-26 | Disposition: A | Discharge status: Home / Self Care | Payer: BC Managed Care – PPO | Source: Ambulatory Visit | Attending: Internal Medicine | Admitting: Internal Medicine

## 2011-11-26 NOTE — Progress Notes (Signed)
Physical Therapy Treatment Patient Details  Name: Karen Mercer MRN: [id number] Date of Birth: [birth year]  Today's Date: 11/26/2011 Time: 4098-1191 PT Time Calculation (min): 41 min Charges:  therex 40' Visit#: 4  of 8   Re-eval: 12/14/11    Subjective: Symptoms/Limitations Symptoms: Pain remains the same as last visit, 4/10.   Pain Assessment Currently in Pain?: Yes Pain Score:   4 Pain Location: Knee Pain Orientation: Right   Exercise/Treatments Machines for Strengthening Cybex Leg Press: 1 PL R LE only 2 sets 10 reps Standing Heel Raises: 20 reps Heel Raises Limitations: toe raises 20 reps Knee Flexion: 20 reps;Limitations Knee Flexion Limitations: 3# Lateral Step Up: 10 reps;Step Height: 4";Right;Hand Hold: 1 Forward Step Up: 10 reps;Step Height: 4";Right;Hand Hold: 1 Step Down: 10 reps;Hand Hold: 1;Step Height: 4" Functional Squat: 15 reps Rocker Board: 2 minutes SLS: R 18", L 55" max of 3 SLS with Vectors: 5X5" R only Seated Long Arc Quad: 20 reps Long Arc Quad Weight: 4 lbs. Supine Straight Leg Raises: 20 reps Sidelying Hip ABduction: 20 reps Hip ABduction Limitations: 3# Hip ADduction: 20 reps Prone  Hamstring Curl: 20 reps Hamstring Curl Limitations: 3# Hip Extension: 20 reps Hip Extension Limitations: 3#      Physical Therapy Assessment and Plan PT Assessment and Plan Clinical Impression Statement: Able to increase all reps and add vector stance today without difficulty.  Pt. concerned with hardened area inferior medial  knee; suggested asking MD.  Pt. without complaint with exercises. PT Plan: Progress to rebounder when able to complete 1' with R SLS; Increase weights next visit.     Problem List Patient Active Problem List  Diagnosis  . SUBLUXATION PATELLAR (MALALIGNMENT)  . HEMARTHROSIS  . PATELLO-FEMORAL SYNDROME  . Patellar malalignment syndrome  . Patellar instability  . Recurrent dislocation of right patella  . Knee instability    . Knee pain  . Stiffness of joint, not elsewhere classified, lower leg  . Gait abnormality  . Muscle weakness (generalized)  . Difficulty in walking  . Pain in right knee  . Weakness of right leg    PT - End of Session Activity Tolerance: Patient tolerated treatment well General Behavior During Session: [facility] for tasks performed Cognition: [facility] for tasks performed   Lurena Nida, PTA/CLT 11/26/2011, 4:55 PM

## 2011-11-28 ENCOUNTER — Ambulatory Visit (HOSPITAL_COMMUNITY)
Admission: RE | Admit: 2011-11-28 | Discharge: 2011-11-28 | Disposition: A | Discharge status: Home / Self Care | Payer: BC Managed Care – PPO | Source: Ambulatory Visit | Attending: Internal Medicine | Admitting: Internal Medicine

## 2011-12-03 ENCOUNTER — Encounter: Payer: Self-pay | Admitting: Orthopedic Surgery

## 2011-12-03 ENCOUNTER — Ambulatory Visit (HOSPITAL_COMMUNITY): Payer: BC Managed Care – PPO | Admitting: Physical Therapy

## 2011-12-05 ENCOUNTER — Ambulatory Visit (HOSPITAL_COMMUNITY)
Admission: RE | Admit: 2011-12-05 | Discharge: 2011-12-05 | Disposition: A | Discharge status: Home / Self Care | Payer: BC Managed Care – PPO | Source: Ambulatory Visit | Attending: Internal Medicine | Admitting: Internal Medicine

## 2011-12-05 NOTE — Evaluation (Signed)
Physical Therapy Re-evaluation  Patient Details  Name: Karen Mercer MRN: [id number] Date of Birth: [birth year]  Today's Date: 12/05/2011 Time: 4098-1191 PT Time Calculation (min): 41 min  Visit#: 5  of 16   Re-eval: 01/02/12 Diagnosis: R knee scope Surgical Date: 08/17/11 Next MD Visit: Dr. Romeo Apple 12/06/11 Charges: therex 15', MMT, ROM test  Subjective Symptoms/Limitations Symptoms: Pt. states she has to go back to work on Monday due to her FMLA running out.  Returns to MD tomorrow.  States her pain is 4/10 currently; increases to 8/10 with activity. Pain Assessment Currently in Pain?: Yes Pain Score:   4 Pain Location: Knee Pain Orientation: Right  Precautions/Restrictions  Precautions Precaution Comments: No BIKE, MD wants her to do exercise in neoprene brace   Objective:  Functional Tests: LEFS 47/80 (was 22/80)  RLE AROM (degrees) Right Knee Extension: 0  Right Knee Flexion: 125  (was 74 degrees)  RLE PROM (degrees) Right Knee Extension: 0  Right Knee Flexion: 135  (was 130 degrees)  RLE Strength Right Hip Flexion: 3+/5 (was 3/5) Right Hip Extension: 3+/5 (was 3/5) Right Hip ABduction: 4/5 (was 3+/5) Right Hip ADduction: 3+/5 (was 3+/5) Right Knee Flexion: 4/5 (was 3+/5) Right Knee Extension: 4/5 (was 3/5) Right Ankle Dorsiflexion: 5/5 (with pain, was 4/5)  Exercise/Treatments Mobility/Balance  Static Standing Balance Single Leg Stance - Right Leg: 33  (was 5 seconds) Single Leg Stance - Left Leg: 60  (was 26 seconds) Tandem Stance - Right Leg: 60  (was 18 seconds) Tandem Stance - Left Leg: 60  (was 27 seconds)  Machines for Strengthening Cybex Leg Press: 1 PL R LE only 2 sets 15 reps Standing Heel Raises: 20 reps Heel Raises Limitations:  No UE's; toe raises 20 reps Knee Flexion: 20 reps;Limitations Knee Flexion Limitations: 4#    Physical Therapy Assessment and Plan PT Assessment and Plan Clinical Impression Statement: Pt. R/S MD appt. to  earlier date due to need to return to work earlier than expected.  Pt. with overall improved strength and ROM ; Pt. has met 3/5 STG's but none of her LTG's to date.  Pt. continues to have increased pain with prolonged activity and tasks.  Pt. will need more therapy to returned to desired functional level.   PT Plan: Recommend continuing therapy X 4 more weeks; Continue to progress with focus on steps, R LE stability and activity tolerance.    Goals Home Exercise Program Pt will Perform Home Exercise Program: Independently PT Goal: Perform Home Exercise Program - Progress: Met PT Short Term Goals Time to Complete Short Term Goals: 2 weeks PT Short Term Goal 1: increase mm grade by 1/2 grade PT Short Term Goal 1 - Progress: Met PT Short Term Goal 2: Pt to be able to ambulate for fifteen  minutes without increase pain PT Short Term Goal 2 - Progress: Met PT Short Term Goal 3: LEFS increased by 5 PT Short Term Goal 3 - Progress: Met PT Short Term Goal 4: Able to stand for 10 minutes without increased pain PT Short Term Goal 4 - Progress: Not met PT Short Term Goal 5 - Progress: Progressing toward goal PT Long Term Goals Time to Complete Long Term Goals: 4 weeks PT Long Term Goal 1: Strentgth to be increased one grade to be able to wallk without neoprene brace. PT Long Term Goal 1 - Progress: Partly met PT Long Term Goal 2: Pain to be less than 3/10 75% of the day PT Long Term Goal  2 - Progress: Not met Long Term Goal 3: Able to walk for an hour in order to be able to go shopping Long Term Goal 3 Progress: Not met Long Term Goal 4: able to sit for an hour and a half in order to travel or go to the movies Long Term Goal 4 Progress: Not met PT Long Term Goal 5: Pt to be able to tolerate standing for an hour. Long Term Goal 5 Progress: Not met  Problem List Patient Active Problem List  Diagnosis  . SUBLUXATION PATELLAR (MALALIGNMENT)  . HEMARTHROSIS  . PATELLO-FEMORAL SYNDROME  .  Patellar malalignment syndrome  . Patellar instability  . Recurrent dislocation of right patella  . Knee instability  . Knee pain  . Stiffness of joint, not elsewhere classified, lower leg  . Gait abnormality  . Muscle weakness (generalized)  . Difficulty in walking  . Pain in right knee  . Weakness of right leg    PT - End of Session Activity Tolerance: Patient tolerated treatment well General Behavior During Session: [facility] for tasks performed Cognition: [facility] for tasks performed   Lurena Nida, PTA/CLT 12/05/2011, 4:12 PM

## 2011-12-06 ENCOUNTER — Ambulatory Visit: Payer: BC Managed Care – PPO | Admitting: Orthopedic Surgery

## 2011-12-11 ENCOUNTER — Encounter: Payer: Self-pay | Admitting: Orthopedic Surgery

## 2011-12-11 ENCOUNTER — Ambulatory Visit (INDEPENDENT_AMBULATORY_CARE_PROVIDER_SITE_OTHER): Payer: BC Managed Care – PPO | Admitting: Orthopedic Surgery

## 2011-12-11 VITALS — BP 130/82 | Ht 62.0 in | Wt 140.0 lb

## 2011-12-11 DIAGNOSIS — Z9889 Other specified postprocedural states: Secondary | ICD-10-CM

## 2011-12-11 MED ORDER — OXYCODONE-ACETAMINOPHEN 5-325 MG PO TABS: 1.0000 | ORAL_TABLET | ORAL | Status: AC | PRN

## 2011-12-11 NOTE — Progress Notes (Signed)
Subjective:     Patient ID: Karen Mercer, female   DOB: 03/17/1971, 41 y.o.   MRN: [id number]  Chief Complaint  Patient presents with  . Follow-up    recheck right knee, return to work? DOS 08/17/11    HPI  Status post patellar realignment surgery with tibial tubercle transfer so-called Fulkerson procedure with medial reefing.  She's doing well her previous symptoms along the medial side of the knee are resolved with topical Max Fran [employer]  She is to go back to work where she will lose her job she said a patella stabilizing brace Review of Systems Normal at present    Objective:   Physical Exam . She is walking normally without assistive device other than a patellar stabilizer brace her skin is intact with no sensitivity. There is no swelling in the knee. The patella is stable, she does have some apprehension and sensitivity on the medial side of the knee. Range of motion is full collateral and cruciate ligaments are stable. Muscle tone is excellent and the neurovascular exam is intact    Assessment:     Tubercle osteotomy has healed on previous x-ray. She can return to work. She should wear patella stabilizing brace. Continue current medication Percocet 5 mg as needed    Plan:     Return to work return here in a month to check on her progress after returning to work

## 2011-12-11 NOTE — Patient Instructions (Addendum)
RTW on ASAP 12-11-2011, no restrictions

## 2011-12-19 ENCOUNTER — Ambulatory Visit: Payer: BC Managed Care – PPO | Admitting: Orthopedic Surgery

## 2012-01-02 ENCOUNTER — Encounter: Payer: Self-pay | Admitting: Orthopedic Surgery

## 2012-01-02 ENCOUNTER — Ambulatory Visit: Payer: BC Managed Care – PPO | Admitting: Orthopedic Surgery

## 2012-01-10 ENCOUNTER — Ambulatory Visit: Payer: BC Managed Care – PPO | Admitting: Orthopedic Surgery

## 2012-01-16 ENCOUNTER — Encounter: Payer: Self-pay | Admitting: Orthopedic Surgery

## 2012-01-16 ENCOUNTER — Ambulatory Visit (INDEPENDENT_AMBULATORY_CARE_PROVIDER_SITE_OTHER): Payer: BC Managed Care – PPO | Admitting: Orthopedic Surgery

## 2012-01-16 VITALS — BP 110/82 | Ht 62.0 in | Wt 140.0 lb

## 2012-01-16 DIAGNOSIS — M25569 Pain in unspecified knee: Secondary | ICD-10-CM

## 2012-01-16 DIAGNOSIS — M25562 Pain in left knee: Secondary | ICD-10-CM | POA: Insufficient documentation

## 2012-01-16 DIAGNOSIS — S83006A Unspecified dislocation of unspecified patella, initial encounter: Secondary | ICD-10-CM

## 2012-01-16 MED ORDER — DICLOFENAC POTASSIUM 50 MG PO TABS: 50.0000 mg | ORAL_TABLET | Freq: Two times a day (BID) | ORAL | Status: DC

## 2012-01-16 MED ORDER — HYDROCODONE-ACETAMINOPHEN 7.5-325 MG PO TABS: 1.0000 | ORAL_TABLET | ORAL | Status: AC | PRN

## 2012-01-16 NOTE — Patient Instructions (Signed)
Brace left   You have received a steroid shot. 15% of patients experience increased pain at the injection site with in the next 24 hours. This is best treated with ice and tylenol extra strength 2 tabs every 8 hours. If you are still having pain please call the office.    Pick up from pharmacy

## 2012-01-16 NOTE — Progress Notes (Signed)
Patient ID: Karen Mercer, female   DOB: [birth year], 41 y.o.   MRN: [id number] Chief Complaint  Patient presents with  . Follow-up    one month recheck right knee, DOS 08/17/11    Status post tibial tubercle transfer and proximal realignment for dislocating right patella  Complaint of anterior knee pain over the screw sites  Complains of anterior left knee pain and subluxation  She's not interested in surgery at this time  Most of her pain is aching.  Exam shows tenderness over the screw sites on the right knee with apprehension positive on the left knee  Right knee flexion is normal left knee flexion is normal right knee apprehension is negative  Distal neurovascular exam is normal in both lower extremities  Strength is normal in both lower extremities  Skin is normal in both lower extremities  BP 110/82  Ht 5\' 2"  (1.575 m)  Wt 140 lb (63.504 kg)  BMI 25.61 kg/m2 Her general appearance is normal today she is oriented x3 her mood and affect are normal she is walking without assistive device her left  Impression hardware irritation on the right patellofemoral subluxation dislocation on the left  Recommend a stabilizer brace on the left and a cortisone injection on the right  Procedure injection right knee Diagnosis hardware irritation Informed consent completed 40 mg of Depo-Medrol and 2 cc 1% lidocaine injected under sterile conditions using alcohol and ethyl chloride as a prep No complications

## 2012-04-09 ENCOUNTER — Other Ambulatory Visit: Payer: Self-pay | Admitting: Orthopedic Surgery

## 2012-04-09 DIAGNOSIS — M25569 Pain in unspecified knee: Secondary | ICD-10-CM

## 2012-04-09 MED ORDER — HYDROCODONE-ACETAMINOPHEN 7.5-325 MG PO TABS: 1.0000 | ORAL_TABLET | ORAL | Status: DC | PRN

## 2012-05-22 ENCOUNTER — Encounter: Payer: Self-pay | Admitting: Orthopedic Surgery

## 2012-05-22 ENCOUNTER — Ambulatory Visit (INDEPENDENT_AMBULATORY_CARE_PROVIDER_SITE_OTHER): Payer: BC Managed Care – PPO | Admitting: Orthopedic Surgery

## 2012-05-22 VITALS — BP 120/80 | Ht 62.0 in | Wt 140.0 lb

## 2012-05-22 DIAGNOSIS — M25569 Pain in unspecified knee: Secondary | ICD-10-CM

## 2012-05-22 DIAGNOSIS — M25562 Pain in left knee: Secondary | ICD-10-CM

## 2012-05-22 DIAGNOSIS — M25369 Other instability, unspecified knee: Secondary | ICD-10-CM

## 2012-05-22 DIAGNOSIS — M238X9 Other internal derangements of unspecified knee: Secondary | ICD-10-CM

## 2012-05-22 MED ORDER — HYDROCODONE-ACETAMINOPHEN 7.5-325 MG PO TABS: 1.0000 | ORAL_TABLET | ORAL | Status: DC | PRN

## 2012-05-22 MED ORDER — LIDOCAINE 5 % EX PTCH: 1.0000 | MEDICATED_PATCH | CUTANEOUS | Status: DC

## 2012-05-22 NOTE — Progress Notes (Signed)
Patient ID: Karen Mercer, female   DOB: [birth year], 42 y.o.   MRN: [id number] Chief Complaint  Patient presents with  . Follow-up    Recheck right knee pain. DOS 08-17-11.    excrutiating bilateral knee pain   Denies back pain   norco puts her to sleep  S/p right knee proximal and distal realignment  ROS right knee feels like it will give out  Bilateral posterior knee pain    No past medical history on file.  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

## 2012-05-22 NOTE — Patient Instructions (Signed)
Continue ibuprofen and norco   Apply 1 pain patch to each knee daily

## 2012-05-23 ENCOUNTER — Telehealth: Payer: Self-pay | Admitting: *Deleted

## 2012-05-23 NOTE — Telephone Encounter (Signed)
Faxed [employer] prior authorization for lidoderm patches. Awaiting response.

## 2012-05-26 ENCOUNTER — Telehealth: Payer: Self-pay | Admitting: *Deleted

## 2012-05-26 NOTE — Telephone Encounter (Signed)
No   i cant do anything with an insurance denial  All pain meds cause drowsiness

## 2012-05-26 NOTE — Telephone Encounter (Signed)
You prescribed Karen Mercer Lidoderm patches on 05/22/12 for bilateral knee pain and [employer] has denied them stating they do not meet medical necessity. She has prescription for Norco as well but states that makes her sleepy.  Would you like to prescribe something else for her. Patient called today and stated she has been in pain all weekend.

## 2012-05-29 NOTE — Telephone Encounter (Signed)
Called patient and told her what Dr. Romeo Apple said per phone note

## 2012-06-25 ENCOUNTER — Ambulatory Visit: Payer: BC Managed Care – PPO | Admitting: Orthopedic Surgery

## 2012-06-25 ENCOUNTER — Encounter: Payer: Self-pay | Admitting: Orthopedic Surgery

## 2012-07-02 ENCOUNTER — Encounter: Payer: Self-pay | Admitting: Orthopedic Surgery

## 2012-07-02 ENCOUNTER — Ambulatory Visit (INDEPENDENT_AMBULATORY_CARE_PROVIDER_SITE_OTHER): Payer: BC Managed Care – PPO | Admitting: Orthopedic Surgery

## 2012-07-02 VITALS — BP 132/88 | Ht 62.0 in | Wt 140.0 lb

## 2012-07-02 DIAGNOSIS — M25362 Other instability, left knee: Secondary | ICD-10-CM | POA: Insufficient documentation

## 2012-07-02 NOTE — Patient Instructions (Addendum)
You have received a steroid shot. 15% of patients experience increased pain at the injection site with in the next 24 hours. This is best treated with ice and tylenol extra strength 2 tabs every 8 hours. If you are still having pain please call the office.    Surgery on March 14th   OOW rest of the week  Post op appt 28th

## 2012-07-02 NOTE — Progress Notes (Signed)
Patient ID: Karen Mercer, female   DOB: 07/08/1970, 42 y.o.   MRN: [id number] Chief Complaint  Patient presents with  . 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 The patient says she is at the end of the rope in regards to this knee and wishes to have something done with it. She can't take it anymore. She has instability episodes dull throbbing aching severe pain which is constant but waxes and wanes in intensity. It happens after exercise it happens at night it's worsening. Swelling is bad.  2 related review of systems musculoskeletal no other issues her right leg has calmed down neurologically normal  BP 132/88  Ht 5\' 2"  (1.575 m)  Wt 140 lb (63.504 kg)  BMI 25.6 kg/m2 General appearance is normal, the patient is alert and oriented x3 with normal mood and affect. Ambulation is abnormal with a left lower extremity limp  The right knee incisions are healed and non-tender no sensitivities today. Her range of motion is normal stability of the patella was normal muscle tone normal skin incisions again normal pulse and temperature normal strength sensory exam normal  The left knee has a large joint effusion range of motion is limited by this joint effusion less than 100. The patella is unstable with severe apprehension muscle tone is normal skin is intact pulses normal sensation is normal  The first thing we'll do is do an aspiration of the knee  Get an x-ray at the hospital during the preop visit  She wishes to proceed with proximal and distal realignment. She is familiar with this procedure familiar with the risks and benefits of the procedure the postoperative course  Should work for the next 3 days surgery be scheduled for March 14 with a followup on the 17th  Diagnosis patellar instability  Plan procedure proximal and distal  realignment  Knee  Injection and aspiration Procedure Note  Pre-operative Diagnosis: left knee oa, effusion  Post-operative Diagnosis: same  Indications: pain, swelling  Anesthesia: ethyl chloride   Procedure Details   Verbal consent was obtained for the procedure. Time out was completed.The joint was prepped with alcohol, followed by  Ethyl chloride spray and The 18-gauge needle was inserted into the joint via lateral approach and we aspirated approximately 50cc of clear yellow fluid  This was followed by the injection of 4ml 1% lidocaine and 1 ml of depomedrol  was then injected into the joint . The needle was removed and the area cleansed and dressed.  Complications:  None; patient tolerated the procedure well.

## 2012-07-04 ENCOUNTER — Encounter (HOSPITAL_COMMUNITY): Payer: Self-pay | Admitting: Pharmacy Technician

## 2012-07-10 ENCOUNTER — Telehealth: Payer: Self-pay | Admitting: Orthopedic Surgery

## 2012-07-10 NOTE — Telephone Encounter (Signed)
Patient states that she will need to re-schedule surgery date of Friday, 07/18/12, due to work and [employer].  She states that her employer notes that she is not eligible for FMLA again until after 08/16/12.  Please call patient regarding an updated pre-op date and surgery date - ph# [phone].

## 2012-07-10 NOTE — Telephone Encounter (Signed)
Please have Tammy change the date

## 2012-07-10 NOTE — Telephone Encounter (Signed)
Rescheduled surgery for 08/29/12

## 2012-07-14 ENCOUNTER — Inpatient Hospital Stay (HOSPITAL_COMMUNITY): Admission: RE | Admit: 2012-07-14 | Payer: BC Managed Care – PPO | Source: Ambulatory Visit

## 2012-07-21 ENCOUNTER — Ambulatory Visit: Payer: BC Managed Care – PPO | Admitting: Orthopedic Surgery

## 2012-07-28 ENCOUNTER — Other Ambulatory Visit: Payer: Self-pay | Admitting: *Deleted

## 2012-07-28 DIAGNOSIS — M25562 Pain in left knee: Secondary | ICD-10-CM

## 2012-07-28 DIAGNOSIS — M25561 Pain in right knee: Secondary | ICD-10-CM

## 2012-07-28 MED ORDER — HYDROCODONE-ACETAMINOPHEN 7.5-325 MG PO TABS: 1.0000 | ORAL_TABLET | ORAL | Status: DC | PRN

## 2012-07-30 ENCOUNTER — Ambulatory Visit (INDEPENDENT_AMBULATORY_CARE_PROVIDER_SITE_OTHER): Payer: BC Managed Care – PPO | Admitting: Orthopedic Surgery

## 2012-07-30 VITALS — BP 140/80 | Ht 62.0 in | Wt 140.0 lb

## 2012-07-30 DIAGNOSIS — M25462 Effusion, left knee: Secondary | ICD-10-CM

## 2012-07-30 DIAGNOSIS — M25469 Effusion, unspecified knee: Secondary | ICD-10-CM

## 2012-07-30 MED ORDER — IBUPROFEN 800 MG PO TABS: 800.0000 mg | ORAL_TABLET | Freq: Three times a day (TID) | ORAL | Status: DC | PRN

## 2012-07-30 NOTE — Progress Notes (Signed)
Patient ID: Karen Mercer, female   DOB: 1971-04-30, 42 y.o.   MRN: [id number] Chief Complaint  Patient presents with  . Joint Swelling    Left knee swelling    BP 140/80  Ht 5\' 2"  (1.575 m)  Wt 140 lb (63.504 kg)  BMI 25.6 kg/m2   I need the fluid drawn off  Knee  Injection and aspiration Procedure Note  Pre-operative Diagnosis: left knee oa, effusion  Post-operative Diagnosis: same  Indications: pain, swelling  Anesthesia: ethyl chloride   Procedure Details   Verbal consent was obtained for the procedure. Time out was completed.The joint was prepped with alcohol, followed by  Ethyl chloride spray and The 18-gauge needle was inserted into the joint via lateral approach and we aspirated approximately 100 cc of clear yellow fluid  This was followed by the injection of 4ml 1% lidocaine and 1 ml of depomedrol  was then injected into the joint . The needle was removed and the area cleansed and dressed.  Complications:  None; patient tolerated the procedure well.

## 2012-07-30 NOTE — Patient Instructions (Addendum)
You have received a steroid shot. 15% of patients experience increased pain at the injection site with in the next 24 hours. This is best treated with ice and tylenol extra strength 2 tabs every 8 hours. If you are still having pain please call the office.    

## 2012-08-15 ENCOUNTER — Telehealth: Payer: Self-pay | Admitting: Orthopedic Surgery

## 2012-08-15 NOTE — Telephone Encounter (Signed)
Contacted [employer], ph# [phone], regarding out-patient surgery scheduled 08/29/12 at [facility]; reached automated voice message for [employer] stating that their offices are closed today for an event.  Left message for return call back, regarding CPT codes 62130, [phone], regarding whether a pre-authorization is required for these codes.

## 2012-08-19 NOTE — Telephone Encounter (Signed)
Per call back from insurer, [place], spoke with Tamika F, no pre-authorization is required for procedure codes noted for out-patient.  Her name, today's date, 3:29p.m, for reference.

## 2012-08-25 ENCOUNTER — Encounter (HOSPITAL_COMMUNITY)
Admission: RE | Admit: 2012-08-25 | Discharge: 2012-08-25 | Disposition: A | Discharge status: Home / Self Care | Payer: BC Managed Care – PPO | Source: Ambulatory Visit | Attending: Orthopedic Surgery | Admitting: Orthopedic Surgery

## 2012-08-25 NOTE — Patient Instructions (Addendum)
Karen Mercer  08/25/2012   Your procedure is scheduled on:  08/29/2012  Report to [facility] at  615  AM.  Call this number if you have problems the morning of surgery: [phone]   Remember:   Do not eat food or drink liquids after midnight.   Take these medicines the morning of surgery with A SIP OF WATER: norco   Do not wear jewelry, make-up or nail polish.  Do not wear lotions, powders, or perfumes.  Do not shave 48 hours prior to surgery. Men may shave face and neck.  Do not bring valuables to the hospital.  Contacts, dentures or bridgework may not be worn into surgery.  Leave suitcase in the car. After surgery it may be brought to your room.  For patients admitted to the hospital, checkout time is 11:00 AM the day of discharge.   Patients discharged the day of surgery will not be allowed to drive  home.  Name and phone number of your driver: family  Special Instructions: Shower using CHG 2 nights before surgery and the night before surgery.  If you shower the day of surgery use CHG.  Use special wash - you have one bottle of CHG for all showers.  You should use approximately 1/3 of the bottle for each shower.   Please read over the following fact sheets that you were given: Pain Booklet, Coughing and Deep Breathing, MRSA Information, Surgical Site Infection Prevention, Anesthesia Post-op Instructions and Care and Recovery After Surgery Arthroscopic Procedure, Knee An arthroscopic procedure can find what is wrong with your knee. PROCEDURE Arthroscopy is a surgical technique that allows your orthopedic surgeon to diagnose and treat your knee injury with accuracy. They will look into your knee through a small instrument. This is almost like a small (pencil sized) telescope. Because arthroscopy affects your knee less than open knee surgery, you can anticipate a more rapid recovery. Taking an active role by following your caregiver's instructions will help with rapid and complete  recovery. Use crutches, rest, elevation, ice, and knee exercises as instructed. The length of recovery depends on various factors including type of injury, age, physical condition, medical conditions, and your rehabilitation. Your knee is the joint between the large bones (femur and tibia) in your leg. Cartilage covers these bone ends which are smooth and slippery and allow your knee to bend and move smoothly. Two menisci, thick, semi-lunar shaped pads of cartilage which form a rim inside the joint, help absorb shock and stabilize your knee. Ligaments bind the bones together and support your knee joint. Muscles move the joint, help support your knee, and take stress off the joint itself. Because of this all programs and physical therapy to rehabilitate an injured or repaired knee require rebuilding and strengthening your muscles. AFTER THE PROCEDURE  After the procedure, you will be moved to a recovery area until most of the effects of the medication have worn off. Your caregiver will discuss the test results with you.  Only take over-the-counter or prescription medicines for pain, discomfort, or fever as directed by your caregiver. SEEK MEDICAL CARE IF:   You have increased bleeding from your wounds.  You see redness, swelling, or have increasing pain in your wounds.  You have pus coming from your wound.  You have an oral temperature above 102 F (38.9 C).  You notice a bad smell coming from the wound or dressing.  You have severe pain with any motion of your  knee. SEEK IMMEDIATE MEDICAL CARE IF:   You develop a rash.  You have difficulty breathing.  You have any allergic problems. Document Released: 04/20/2000 Document Revised: 07/16/2011 Document Reviewed: 11/12/2007 [facility] Patient Information 2013 [place], [place]. PATIENT INSTRUCTIONS POST-ANESTHESIA  IMMEDIATELY FOLLOWING SURGERY:  Do not drive or operate machinery for the first twenty four hours after surgery.  Do not make  any important decisions for twenty four hours after surgery or while taking narcotic pain medications or sedatives.  If you develop intractable nausea and vomiting or a severe headache please notify your doctor immediately.  FOLLOW-UP:  Please make an appointment with your surgeon as instructed. You do not need to follow up with anesthesia unless specifically instructed to do so.  WOUND CARE INSTRUCTIONS (if applicable):  Keep a dry clean dressing on the anesthesia/puncture wound site if there is drainage.  Once the wound has quit draining you may leave it open to air.  Generally you should leave the bandage intact for twenty four hours unless there is drainage.  If the epidural site drains for more than 36-48 hours please call the anesthesia department.  QUESTIONS?:  Please feel free to call your physician or the hospital operator if you have any questions, and they will be happy to assist you.

## 2012-08-26 ENCOUNTER — Ambulatory Visit (HOSPITAL_COMMUNITY)
Admission: RE | Admit: 2012-08-26 | Discharge: 2012-08-26 | Disposition: A | Discharge status: Home / Self Care | Payer: BC Managed Care – PPO | Source: Ambulatory Visit | Attending: Orthopedic Surgery | Admitting: Orthopedic Surgery

## 2012-08-26 ENCOUNTER — Encounter (HOSPITAL_COMMUNITY): Payer: Self-pay

## 2012-08-26 ENCOUNTER — Encounter (HOSPITAL_COMMUNITY)
Admission: RE | Admit: 2012-08-26 | Discharge: 2012-08-26 | Disposition: A | Discharge status: Home / Self Care | Payer: BC Managed Care – PPO | Source: Ambulatory Visit | Attending: Orthopedic Surgery | Admitting: Orthopedic Surgery

## 2012-08-26 VITALS — BP 144/96 | HR 70 | Temp 98.6°F | Resp 22 | Ht 62.0 in | Wt 135.0 lb

## 2012-08-26 DIAGNOSIS — M238X9 Other internal derangements of unspecified knee: Secondary | ICD-10-CM | POA: Insufficient documentation

## 2012-08-26 DIAGNOSIS — M25362 Other instability, left knee: Secondary | ICD-10-CM

## 2012-08-26 LAB — HEMOGLOBIN AND HEMATOCRIT, BLOOD: HCT: 35.9 % — ABNORMAL LOW (ref 36.0–46.0)

## 2012-08-26 NOTE — Patient Instructions (Addendum)
Karen Mercer  08/26/2012   Your procedure is scheduled on:  08/29/12  Report to [facility] at 06:15 AM.  Call this number if you have problems the morning of surgery: [phone]   Remember:   Do not eat food or drink liquids after midnight.   Take these medicines the morning of surgery with A SIP OF WATER: Hydrocodone if needed.   Do not wear jewelry, make-up or nail polish.  Do not wear lotions, powders, or perfumes.   Do not shave 48 hours prior to surgery. Men may shave face and neck.  Do not bring valuables to the hospital.  Contacts, dentures or bridgework may not be worn into surgery.  Leave suitcase in the car. After surgery it may be brought to your room.  For patients admitted to the hospital, checkout time is 11:00 AM the day of  discharge.   Patients discharged the day of surgery will not be allowed to drive  home.    Special Instructions: Shower using CHG 2 nights before surgery and the night before surgery.  If you shower the day of surgery use CHG.  Use special wash - you have one bottle of CHG for all showers.  You should use approximately 1/3 of the bottle for each shower.   Please read over the following fact sheets that you were given: Pain Booklet, MRSA Information, Surgical Site Infection Prevention, Anesthesia Post-op Instructions and Care and Recovery After Surgery    Arthroscopic Procedure, Knee An arthroscopic procedure can find what is wrong with your knee. PROCEDURE Arthroscopy is a surgical technique that allows your orthopedic surgeon to diagnose and treat your knee injury with accuracy. They will look into your knee through a small instrument. This is almost like a small (pencil sized) telescope. Because arthroscopy affects your knee less than open knee surgery, you can anticipate a more rapid recovery. Taking an active role by following your caregiver's instructions will help with rapid and complete recovery. Use crutches, rest, elevation, ice, and knee  exercises as instructed. The length of recovery depends on various factors including type of injury, age, physical condition, medical conditions, and your rehabilitation. Your knee is the joint between the large bones (femur and tibia) in your leg. Cartilage covers these bone ends which are smooth and slippery and allow your knee to bend and move smoothly. Two menisci, thick, semi-lunar shaped pads of cartilage which form a rim inside the joint, help absorb shock and stabilize your knee. Ligaments bind the bones together and support your knee joint. Muscles move the joint, help support your knee, and take stress off the joint itself. Because of this all programs and physical therapy to rehabilitate an injured or repaired knee require rebuilding and strengthening your muscles. AFTER THE PROCEDURE  After the procedure, you will be moved to a recovery area until most of the effects of the medication have worn off. Your caregiver will discuss the test results with you.  Only take over-the-counter or prescription medicines for pain, discomfort, or fever as directed by your caregiver. SEEK MEDICAL CARE IF:   You have increased bleeding from your wounds.  You see redness, swelling, or have increasing pain in your wounds.  You have pus coming from your wound.  You have an oral temperature above 102 F (38.9 C).  You notice a bad smell coming from the wound or dressing.  You have severe pain with any motion of your knee. SEEK IMMEDIATE MEDICAL CARE IF:   You develop  a rash.  You have difficulty breathing.  You have any allergic problems. Document Released: 04/20/2000 Document Revised: 07/16/2011 Document Reviewed: 11/12/2007 [facility] Patient Information 2013 [place], [place].    PATIENT INSTRUCTIONS POST-ANESTHESIA  IMMEDIATELY FOLLOWING SURGERY:  Do not drive or operate machinery for the first twenty four hours after surgery.  Do not make any important decisions for twenty four hours after  surgery or while taking narcotic pain medications or sedatives.  If you develop intractable nausea and vomiting or a severe headache please notify your doctor immediately.  FOLLOW-UP:  Please make an appointment with your surgeon as instructed. You do not need to follow up with anesthesia unless specifically instructed to do so.  WOUND CARE INSTRUCTIONS (if applicable):  Keep a dry clean dressing on the anesthesia/puncture wound site if there is drainage.  Once the wound has quit draining you may leave it open to air.  Generally you should leave the bandage intact for twenty four hours unless there is drainage.  If the epidural site drains for more than 36-48 hours please call the anesthesia department.  QUESTIONS?:  Please feel free to call your physician or the hospital operator if you have any questions, and they will be happy to assist you.

## 2012-08-28 NOTE — H&P (Signed)
BRYANNAH BOSTON is an 42 y.o. female.   Chief Complaint: Left knee pain and instability HPI: This is a 42 year old female status post right patellofemoral realignment proximal and distal with prior failed proximal only realignment who presents now with pain swelling instability and recurrent joint effusions of the left knee. She has been treated with oral pain medication, multiple steroid injections, bracing, rest and activity modification did not improve and worsened to the point where she couldn't work and requested surgical intervention.  No major medical problems  No past medical history on file.  Past Surgical History  Procedure Laterality Date  . Right arm      rods from fractured arm  . Tubal ligation    . Knee surgery  2012    right knee-arthroscopy  . Knee arthroscopy  08/17/2011    Procedure: ARTHROSCOPY KNEE;  Surgeon: [name], MD;  Location: [facility];  Service: Orthopedics;  Laterality: Right;  with tibia tubercle transfer and proximal realignment    Family History  Problem Relation Age of Onset  . Arthritis      family history    Social History:  reports that she has been smoking Cigarettes.  She has a 8.5 pack-year smoking history. She does not have any smokeless tobacco history on file. She reports that  drinks alcohol. She reports that she does not use illicit drugs.  Allergies: No Known Allergies  No prescriptions prior to admission    No results found for this or any previous visit (from the past 48 hour(s)). No results found.  Review of Systems  Musculoskeletal: Positive for joint pain.  All other systems reviewed and are negative.    There were no vitals taken for this visit. Physical Exam  General appearance is normal, the patient is alert and oriented x3 with normal mood and affect. Peripheral vascular system no swelling or varicose veins pulses are intact temperature is normal there is no edema or tenderness  The lymphatic system reveals no  positive palpable lymph nodes  The patient is ambulatory with no assistive device  In terms of her upper extremities alignment is normal, contractures 9, laxity positive tests but no dislocations, muscle strength and tone are normal.  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

## 2012-08-29 ENCOUNTER — Encounter (HOSPITAL_COMMUNITY)
Admission: RE | Disposition: A | Discharge status: Home / Self Care | Payer: Self-pay | Source: Ambulatory Visit | Attending: Orthopedic Surgery

## 2012-08-29 ENCOUNTER — Ambulatory Visit (HOSPITAL_COMMUNITY): Payer: BC Managed Care – PPO

## 2012-08-29 ENCOUNTER — Encounter (HOSPITAL_COMMUNITY): Payer: Self-pay | Admitting: Anesthesiology

## 2012-08-29 ENCOUNTER — Observation Stay (HOSPITAL_COMMUNITY)
Admission: RE | Admit: 2012-08-29 | Discharge: 2012-08-31 | Disposition: A | Discharge status: Home / Self Care | Payer: BC Managed Care – PPO | Source: Ambulatory Visit | Attending: Orthopedic Surgery | Admitting: Orthopedic Surgery

## 2012-08-29 ENCOUNTER — Ambulatory Visit (HOSPITAL_COMMUNITY): Payer: BC Managed Care – PPO | Admitting: Anesthesiology

## 2012-08-29 ENCOUNTER — Encounter (HOSPITAL_COMMUNITY): Payer: Self-pay | Admitting: *Deleted

## 2012-08-29 DIAGNOSIS — M25462 Effusion, left knee: Secondary | ICD-10-CM

## 2012-08-29 DIAGNOSIS — M25362 Other instability, left knee: Secondary | ICD-10-CM

## 2012-08-29 DIAGNOSIS — M24469 Recurrent dislocation, unspecified knee: Secondary | ICD-10-CM

## 2012-08-29 DIAGNOSIS — M9420 Chondromalacia, unspecified site: Secondary | ICD-10-CM

## 2012-08-29 DIAGNOSIS — M2240 Chondromalacia patellae, unspecified knee: Secondary | ICD-10-CM | POA: Diagnosis present

## 2012-08-29 DIAGNOSIS — Z01810 Encounter for preprocedural cardiovascular examination: Secondary | ICD-10-CM | POA: Insufficient documentation

## 2012-08-29 DIAGNOSIS — M25369 Other instability, unspecified knee: Secondary | ICD-10-CM | POA: Diagnosis present

## 2012-08-29 DIAGNOSIS — Z01812 Encounter for preprocedural laboratory examination: Secondary | ICD-10-CM | POA: Insufficient documentation

## 2012-08-29 DIAGNOSIS — S83006A Unspecified dislocation of unspecified patella, initial encounter: Secondary | ICD-10-CM

## 2012-08-29 DIAGNOSIS — M94262 Chondromalacia, left knee: Secondary | ICD-10-CM | POA: Diagnosis present

## 2012-08-29 DIAGNOSIS — M238X9 Other internal derangements of unspecified knee: Principal | ICD-10-CM | POA: Insufficient documentation

## 2012-08-29 DIAGNOSIS — M2390 Unspecified internal derangement of unspecified knee: Secondary | ICD-10-CM | POA: Diagnosis present

## 2012-08-29 HISTORY — PX: KNEE ARTHROSCOPY WITH MEDIAL PATELLAR FEMORAL LIGAMENT RECONSTRUCTION: SHX5652

## 2012-08-29 HISTORY — PX: KNEE ARTHROSCOPY WITH FULKERSON SLIDE: SHX5648

## 2012-08-29 SURGERY — ARTHROSCOPY, KNEE, WITH FULKERSON OSTEOTOMY
Anesthesia: General | Site: Knee | Laterality: Left | Wound class: Clean

## 2012-08-29 MED ORDER — FENTANYL CITRATE 0.05 MG/ML IJ SOLN
25.0000 ug | INTRAMUSCULAR | Status: DC | PRN
  Administered 2012-08-29 (×4): 50 ug via INTRAVENOUS

## 2012-08-29 MED ORDER — FENTANYL CITRATE 0.05 MG/ML IJ SOLN
INTRAMUSCULAR | Status: AC
  Filled 2012-08-29: qty 2

## 2012-08-29 MED ORDER — PHENYLEPHRINE HCL 10 MG/ML IJ SOLN
INTRAMUSCULAR | Status: AC
  Filled 2012-08-29: qty 1

## 2012-08-29 MED ORDER — BUPIVACAINE-EPINEPHRINE PF 0.5-1:200000 % IJ SOLN
INTRAMUSCULAR | Status: AC
  Filled 2012-08-29: qty 30

## 2012-08-29 MED ORDER — ACETAMINOPHEN 10 MG/ML IV SOLN
1000.0000 mg | Freq: Once | INTRAVENOUS | Status: AC
  Administered 2012-08-29: 1000 mg via INTRAVENOUS

## 2012-08-29 MED ORDER — LIDOCAINE HCL (CARDIAC) 20 MG/ML IV SOLN
INTRAVENOUS | Status: DC | PRN
  Administered 2012-08-29: 30 mg via INTRAVENOUS

## 2012-08-29 MED ORDER — CHLORHEXIDINE GLUCONATE 4 % EX LIQD: 60.0000 mL | Freq: Once | CUTANEOUS | Status: DC

## 2012-08-29 MED ORDER — OXYCODONE HCL 5 MG PO TABS
5.0000 mg | ORAL_TABLET | ORAL | Status: DC
  Administered 2012-08-29 – 2012-08-30 (×6): 5 mg via ORAL
  Filled 2012-08-29 (×6): qty 1

## 2012-08-29 MED ORDER — PREGABALIN 50 MG PO CAPS
50.0000 mg | ORAL_CAPSULE | Freq: Three times a day (TID) | ORAL | Status: DC
  Administered 2012-08-29 – 2012-08-31 (×6): 50 mg via ORAL
  Filled 2012-08-29 (×6): qty 1

## 2012-08-29 MED ORDER — PROPOFOL 10 MG/ML IV BOLUS
INTRAVENOUS | Status: DC | PRN
  Administered 2012-08-29: 150 mg via INTRAVENOUS

## 2012-08-29 MED ORDER — ACETAMINOPHEN 10 MG/ML IV SOLN
INTRAVENOUS | Status: AC
  Filled 2012-08-29: qty 100

## 2012-08-29 MED ORDER — CEFAZOLIN SODIUM-DEXTROSE 2-3 GM-% IV SOLR
2.0000 g | INTRAVENOUS | Status: AC
  Administered 2012-08-29: 2 g via INTRAVENOUS

## 2012-08-29 MED ORDER — CEFAZOLIN SODIUM-DEXTROSE 2-3 GM-% IV SOLR
INTRAVENOUS | Status: AC
  Filled 2012-08-29: qty 50

## 2012-08-29 MED ORDER — MIDAZOLAM HCL 2 MG/2ML IJ SOLN
1.0000 mg | INTRAMUSCULAR | Status: DC | PRN
  Administered 2012-08-29: 2 mg via INTRAVENOUS

## 2012-08-29 MED ORDER — ARTIFICIAL TEARS OP OINT
TOPICAL_OINTMENT | OPHTHALMIC | Status: AC
  Filled 2012-08-29: qty 3.5

## 2012-08-29 MED ORDER — CELECOXIB 100 MG PO CAPS
400.0000 mg | ORAL_CAPSULE | Freq: Once | ORAL | Status: AC
  Administered 2012-08-29: 400 mg via ORAL

## 2012-08-29 MED ORDER — MIDAZOLAM HCL 2 MG/2ML IJ SOLN
INTRAMUSCULAR | Status: AC
  Filled 2012-08-29: qty 2

## 2012-08-29 MED ORDER — FENTANYL CITRATE 0.05 MG/ML IJ SOLN
INTRAMUSCULAR | Status: AC
  Filled 2012-08-29: qty 5

## 2012-08-29 MED ORDER — BUPIVACAINE-EPINEPHRINE 0.5% -1:200000 IJ SOLN
INTRAMUSCULAR | Status: DC | PRN
  Administered 2012-08-29: 90 mL

## 2012-08-29 MED ORDER — METHOCARBAMOL 100 MG/ML IJ SOLN: 500.0000 mg | Freq: Four times a day (QID) | INTRAVENOUS | Status: DC

## 2012-08-29 MED ORDER — ACETAMINOPHEN 10 MG/ML IV SOLN
1000.0000 mg | Freq: Four times a day (QID) | INTRAVENOUS | Status: AC
  Administered 2012-08-29 – 2012-08-30 (×3): 1000 mg via INTRAVENOUS
  Filled 2012-08-29 (×3): qty 100

## 2012-08-29 MED ORDER — LACTATED RINGERS IV SOLN
INTRAVENOUS | Status: DC
  Administered 2012-08-29: 07:00:00 via INTRAVENOUS

## 2012-08-29 MED ORDER — LIDOCAINE HCL (PF) 1 % IJ SOLN
INTRAMUSCULAR | Status: AC
  Filled 2012-08-29: qty 5

## 2012-08-29 MED ORDER — EPINEPHRINE HCL 1 MG/ML IJ SOLN
INTRAMUSCULAR | Status: AC
  Filled 2012-08-29: qty 5

## 2012-08-29 MED ORDER — CEFAZOLIN SODIUM-DEXTROSE 2-3 GM-% IV SOLR
2.0000 g | Freq: Three times a day (TID) | INTRAVENOUS | Status: AC
  Administered 2012-08-29 – 2012-08-30 (×3): 2 g via INTRAVENOUS
  Filled 2012-08-29 (×3): qty 50

## 2012-08-29 MED ORDER — ONDANSETRON HCL 4 MG/2ML IJ SOLN
INTRAMUSCULAR | Status: AC
  Filled 2012-08-29: qty 2

## 2012-08-29 MED ORDER — FENTANYL CITRATE 0.05 MG/ML IJ SOLN
INTRAMUSCULAR | Status: DC | PRN
  Administered 2012-08-29 (×2): 50 ug via INTRAVENOUS
  Administered 2012-08-29: 100 ug via INTRAVENOUS
  Administered 2012-08-29 (×2): 25 ug via INTRAVENOUS
  Administered 2012-08-29 (×3): 50 ug via INTRAVENOUS
  Administered 2012-08-29 (×2): 25 ug via INTRAVENOUS
  Administered 2012-08-29: 50 ug via INTRAVENOUS
  Administered 2012-08-29 (×2): 25 ug via INTRAVENOUS

## 2012-08-29 MED ORDER — SODIUM CHLORIDE 0.9 % IR SOLN
Status: DC | PRN
  Administered 2012-08-29 (×2): 1000 mL

## 2012-08-29 MED ORDER — HYDROMORPHONE HCL PF 1 MG/ML IJ SOLN
0.5000 mg | INTRAMUSCULAR | Status: DC | PRN
  Administered 2012-08-29 (×2): 0.5 mg via INTRAVENOUS
  Filled 2012-08-29: qty 1

## 2012-08-29 MED ORDER — OXYCODONE HCL 5 MG PO TABS
ORAL_TABLET | ORAL | Status: AC
  Filled 2012-08-29: qty 1

## 2012-08-29 MED ORDER — ONDANSETRON HCL 4 MG/2ML IJ SOLN
4.0000 mg | Freq: Once | INTRAMUSCULAR | Status: AC
  Administered 2012-08-29: 4 mg via INTRAVENOUS

## 2012-08-29 MED ORDER — METHOCARBAMOL 500 MG PO TABS
500.0000 mg | ORAL_TABLET | Freq: Four times a day (QID) | ORAL | Status: DC
  Administered 2012-08-29 – 2012-08-31 (×8): 500 mg via ORAL
  Filled 2012-08-29 (×8): qty 1

## 2012-08-29 MED ORDER — CELECOXIB 100 MG PO CAPS
ORAL_CAPSULE | ORAL | Status: AC
  Filled 2012-08-29: qty 4

## 2012-08-29 MED ORDER — SODIUM CHLORIDE 0.9 % IR SOLN
Status: DC | PRN
  Administered 2012-08-29 (×5)

## 2012-08-29 MED ORDER — ONDANSETRON HCL 4 MG/2ML IJ SOLN: 4.0000 mg | Freq: Once | INTRAMUSCULAR | Status: DC | PRN

## 2012-08-29 MED ORDER — CELECOXIB 100 MG PO CAPS: 200.0000 mg | ORAL_CAPSULE | Freq: Every day | ORAL | Status: DC

## 2012-08-29 MED ORDER — ENOXAPARIN SODIUM 30 MG/0.3ML ~~LOC~~ SOLN: 30.0000 mg | Freq: Two times a day (BID) | SUBCUTANEOUS | Status: DC

## 2012-08-29 MED ORDER — EPHEDRINE SULFATE 50 MG/ML IJ SOLN
INTRAMUSCULAR | Status: AC
  Filled 2012-08-29: qty 1

## 2012-08-29 MED ORDER — PROPOFOL 10 MG/ML IV EMUL
INTRAVENOUS | Status: AC
  Filled 2012-08-29: qty 20

## 2012-08-29 MED ORDER — METHOCARBAMOL 100 MG/ML IJ SOLN
500.0000 mg | Freq: Once | INTRAVENOUS | Status: AC
  Administered 2012-08-29: 500 mg via INTRAVENOUS
  Filled 2012-08-29: qty 5

## 2012-08-29 MED ORDER — SODIUM CHLORIDE 0.9 % IV SOLN
INTRAVENOUS | Status: DC
  Administered 2012-08-29: 14:00:00 via INTRAVENOUS

## 2012-08-29 MED ORDER — ONDANSETRON HCL 4 MG/2ML IJ SOLN
4.0000 mg | Freq: Four times a day (QID) | INTRAMUSCULAR | Status: DC
  Administered 2012-08-29 – 2012-08-31 (×8): 4 mg via INTRAVENOUS
  Filled 2012-08-29 (×8): qty 2

## 2012-08-29 MED ORDER — PREGABALIN 50 MG PO CAPS
50.0000 mg | ORAL_CAPSULE | Freq: Once | ORAL | Status: AC
  Administered 2012-08-29: 50 mg via ORAL

## 2012-08-29 MED ORDER — OXYCODONE HCL 5 MG PO TABS
5.0000 mg | ORAL_TABLET | Freq: Once | ORAL | Status: AC
  Administered 2012-08-29: 5 mg via ORAL

## 2012-08-29 MED ORDER — PREGABALIN 50 MG PO CAPS
ORAL_CAPSULE | ORAL | Status: AC
  Filled 2012-08-29: qty 1

## 2012-08-29 SURGICAL SUPPLY — 91 items
BANDAGE ELASTIC 6 VELCRO NS (GAUZE/BANDAGES/DRESSINGS) IMPLANT
BANDAGE ESMARK 6X9 LF (GAUZE/BANDAGES/DRESSINGS) ×1 IMPLANT
BIT DRILL 2.4X128 (BIT) IMPLANT
BIT DRILL 2.8X128 (BIT) ×1 IMPLANT
BIT DRILL Q COUPLING 4.5 (BIT) ×1 IMPLANT
BIT DRILL Q/COUPLING 1 (BIT) ×1 IMPLANT
BLADE AGGRESSIVE PLUS 4.0 (BLADE) ×2 IMPLANT
BLADE OSC/SAGITTAL MD 9X18.5 (BLADE) ×2 IMPLANT
BLADE SURG SZ10 CARB STEEL (BLADE) ×4 IMPLANT
BLADE SURG SZ11 CARB STEEL (BLADE) ×2 IMPLANT
BNDG CMPR 9X6 STRL LF SNTH (GAUZE/BANDAGES/DRESSINGS) ×1
BNDG ESMARK 6X9 LF (GAUZE/BANDAGES/DRESSINGS) ×2
BRACE T-SCOPE KNEE POSTOP (MISCELLANEOUS) ×1 IMPLANT
BUR 5.0 BARRELL (BURR) IMPLANT
BUR AGGRESSIVE PLUS 5.0 (BURR) ×1 IMPLANT
BUR BARRELL 4.0 (BURR) IMPLANT
BUR ROUND 5.0 (BURR) IMPLANT
CATH KIT ON Q 2.5IN SLV (PAIN MANAGEMENT) ×1 IMPLANT
CHLORAPREP W/TINT 26ML (MISCELLANEOUS) ×4 IMPLANT
CLOTH BEACON ORANGE TIMEOUT ST (SAFETY) ×2 IMPLANT
COOLER CRYO CUFF IC AND MOTOR (MISCELLANEOUS) ×1 IMPLANT
COVER LIGHT HANDLE STERIS (MISCELLANEOUS) ×4 IMPLANT
COVER PROBE W GEL 5X96 (DRAPES) ×1 IMPLANT
CUFF CRYO KNEE LG 20X31 COOLER (ORTHOPEDIC SUPPLIES) ×1 IMPLANT
CUFF CRYO KNEE18X23 MED (MISCELLANEOUS) ×1 IMPLANT
CUFF TOURNIQUET SINGLE 34IN LL (TOURNIQUET CUFF) ×2 IMPLANT
DECANTER SPIKE VIAL GLASS SM (MISCELLANEOUS) ×3 IMPLANT
ELECT REM PT RETURN 9FT ADLT (ELECTROSURGICAL) ×2
ELECTRODE REM PT RTRN 9FT ADLT (ELECTROSURGICAL) ×1 IMPLANT
FLOOR PAD 36X40 (MISCELLANEOUS) ×2
GAUZE SPONGE 4X4 16PLY XRAY LF (GAUZE/BANDAGES/DRESSINGS) ×2 IMPLANT
GAUZE XEROFORM 5X9 LF (GAUZE/BANDAGES/DRESSINGS) ×2 IMPLANT
GLOVE BIOGEL PI IND STRL 7.0 (GLOVE) IMPLANT
GLOVE BIOGEL PI INDICATOR 7.0 (GLOVE) ×2
GLOVE ECLIPSE 6.5 STRL STRAW (GLOVE) ×2 IMPLANT
GLOVE ECLIPSE 7.0 STRL STRAW (GLOVE) ×2 IMPLANT
GLOVE SKINSENSE NS SZ8.0 LF (GLOVE) ×1
GLOVE SKINSENSE STRL SZ8.0 LF (GLOVE) ×1 IMPLANT
GLOVE SS N UNI LF 8.5 STRL (GLOVE) ×2 IMPLANT
GOWN STRL REIN XL XLG (GOWN DISPOSABLE) ×6 IMPLANT
GUIDEWIRE SPADE POINT 2.0 (WIRE) ×2 IMPLANT
HLDR LEG FOAM (MISCELLANEOUS) ×1 IMPLANT
IMMOBILIZER KNEE 19 UNV (ORTHOPEDIC SUPPLIES) ×1 IMPLANT
INST SET MINOR BONE (KITS) ×2 IMPLANT
IV NS IRRIG 3000ML ARTHROMATIC (IV SOLUTION) ×10 IMPLANT
KIT BLADEGUARD II DBL (SET/KITS/TRAYS/PACK) ×2 IMPLANT
KIT DISPOSABLE T3AM2 (KITS) ×1 IMPLANT
KIT ROOM TURNOVER AP CYSTO (KITS) ×2 IMPLANT
KIT TRANSTIBIAL (DISPOSABLE) ×1 IMPLANT
LEG HOLDER FOAM (MISCELLANEOUS)
MANIFOLD NEPTUNE II (INSTRUMENTS) ×2 IMPLANT
MARKER SKIN DUAL TIP RULER LAB (MISCELLANEOUS) ×2 IMPLANT
NDL HYPO 21X1.5 SAFETY (NEEDLE) ×1 IMPLANT
NDL SPNL 18GX3.5 QUINCKE PK (NEEDLE) ×1 IMPLANT
NEEDLE HYPO 21X1.5 SAFETY (NEEDLE) ×2 IMPLANT
NEEDLE SPNL 18GX3.5 QUINCKE PK (NEEDLE) ×2 IMPLANT
NS IRRIG 1000ML POUR BTL (IV SOLUTION) ×4 IMPLANT
PACK ARTHRO LIMB DRAPE STRL (MISCELLANEOUS) ×2 IMPLANT
PACK BASIC III (CUSTOM PROCEDURE TRAY)
PACK SRG BSC III STRL LF ECLPS (CUSTOM PROCEDURE TRAY) ×1 IMPLANT
PAD ABD 5X9 TENDERSORB (GAUZE/BANDAGES/DRESSINGS) ×1 IMPLANT
PAD ARMBOARD 7.5X6 YLW CONV (MISCELLANEOUS) ×2 IMPLANT
PAD FLOOR 36X40 (MISCELLANEOUS) ×1 IMPLANT
PADDING CAST COTTON 6X4 STRL (CAST SUPPLIES) ×1 IMPLANT
PENCIL HANDSWITCHING (ELECTRODE) ×2 IMPLANT
SCREW CORTEX 4.5X58MM (Screw) ×1 IMPLANT
SCREW CORTEX ST 4.5X46 (Screw) ×1 IMPLANT
SET ARTHROSCOPY INST (INSTRUMENTS) ×2 IMPLANT
SET ARTHROSCOPY PUMP TUBE (IRRIGATION / IRRIGATOR) ×2 IMPLANT
SET BASIN LINEN APH (SET/KITS/TRAYS/PACK) ×2 IMPLANT
SPONGE GAUZE 4X4 12PLY (GAUZE/BANDAGES/DRESSINGS) ×1 IMPLANT
SPONGE LAP 18X18 X RAY DECT (DISPOSABLE) ×2 IMPLANT
STAPLER VISISTAT 35W (STAPLE) ×1 IMPLANT
STRIP CLOSURE SKIN 1/2X4 (GAUZE/BANDAGES/DRESSINGS) ×1 IMPLANT
SUT ETHIBOND 5 LR DA (SUTURE) ×2 IMPLANT
SUT ETHIBOND NAB OS 4 #2 30IN (SUTURE) ×3 IMPLANT
SUT ETHILON 3 0 FSL (SUTURE) ×1 IMPLANT
SUT MON AB 0 CT1 (SUTURE) ×1 IMPLANT
SUT MON AB 2-0 CT1 36 (SUTURE) ×3 IMPLANT
SUT PROLENE 3 0 PS 2 (SUTURE) IMPLANT
SUT TICRON COATED BLUE 2 0 30 (SUTURE) IMPLANT
SUT VIC AB 1 CT1 27 (SUTURE)
SUT VIC AB 1 CT1 27XBRD ANTBC (SUTURE) ×1 IMPLANT
SYR 30ML LL (SYRINGE) ×2 IMPLANT
SYR BULB IRRIGATION 50ML (SYRINGE) ×4 IMPLANT
TAPE CLOTH SURG 4X10 WHT LF (GAUZE/BANDAGES/DRESSINGS) ×1 IMPLANT
TOWEL OR 17X26 4PK STRL BLUE (TOWEL DISPOSABLE) ×2 IMPLANT
WAND 50 DEG COVAC W/CORD (SURGICAL WAND) IMPLANT
WAND 90 DEG TURBOVAC W/CORD (SURGICAL WAND) ×1 IMPLANT
YANKAUER SUCT 12FT TUBE ARGYLE (SUCTIONS) ×2 IMPLANT
YANKAUER SUCT BULB TIP 10FT TU (MISCELLANEOUS) ×7 IMPLANT

## 2012-08-29 NOTE — Interval H&P Note (Signed)
History and Physical Interval Note:  08/29/2012 7:24 AM  Karen Mercer  has presented today for surgery, with the diagnosis of left patella instability  The various methods of treatment have been discussed with the patient and family. After consideration of risks, benefits and other options for treatment, the patient has consented to  Procedure(s): KNEE ARTHROSCOPY WITH FULKERSON SLIDE (Left) KNEE ARTHROSCOPY WITH MEDIAL PATELLAR FEMORAL LIGAMENT RECONSTRUCTION (Left) as a surgical intervention .  The patient's history has been reviewed, patient examined, no change in status, stable for surgery.  I have reviewed the patient's chart and labs.  Questions were answered to the patient's satisfaction.     [name]

## 2012-08-29 NOTE — Evaluation (Signed)
Physical Therapy Evaluation Patient Details Name: Karen Mercer MRN: [id number] DOB: Oct 05, 1970 Today's Date: 08/29/2012 Time: 1740-1800 PT Time Calculation (min): 20 min  PT Assessment / Plan / Recommendation Clinical Impression  Pt recieved lateral release today.  PT had crutches adjusted to height.  Pt assisted to commode.  Pain and fatigue prohibit further treatment.  Pt will need skilled therapy to ensure pt is safe with crutches for gait; if not pt will need walker.    PT Assessment  Patient needs continued PT services    Follow Up Recommendations  Outpatient PT    Does the patient have the potential to tolerate intense rehabilitation    no  Barriers to Discharge  none      Equipment Recommendations    crutches   Recommendations for Other Services   none  Frequency Min 5X/week    Precautions / Restrictions Precautions Precautions: None Required Braces or Orthoses: Knee Immobilizer - Left Knee Immobilizer - Left: On when out of bed or walking Restrictions Weight Bearing Restrictions: Yes   Pertinent Vitals/Pain 7/10      Mobility  Bed Mobility Bed Mobility: Supine to Sit;Sit to Supine Supine to Sit: 5: Supervision Sit to Supine: 5: Supervision Transfers Transfers: Sit to Stand Sit to Stand: 5: Supervision Ambulation/Gait Ambulation/Gait Assistance: 4: Min assist Ambulation Distance (Feet): 4 Feet Assistive device: Crutches Gait Pattern: Step-through pattern;Decreased step length - right;Decreased step length - left Gait velocity: slow TTWB only Stairs: No         PT Diagnosis: Difficulty walking  PT Problem List: Decreased knowledge of use of DME PT Treatment Interventions: Gait training   PT Goals Acute Rehab PT Goals PT Goal Formulation: With patient Time For Goal Achievement: 08/30/12 Potential to Achieve Goals: Good Pt will go Supine/Side to Sit: with modified independence PT Goal: Supine/Side to Sit - Progress: Goal set today Pt will go  Sit to Stand: with modified independence PT Goal: Sit to Stand - Progress: Goal set today Pt will go Stand to Sit: with modified independence PT Goal: Stand to Sit - Progress: Goal set today Pt will Transfer Bed to Chair/Chair to Bed: with modified independence PT Transfer Goal: Bed to Chair/Chair to Bed - Progress: Goal set today Pt will Stand: Independently PT Goal: Stand - Progress: Goal set today Pt will Ambulate: 51 - 150 feet;with modified independence;with crutches PT Goal: Ambulate - Progress: Goal set today  Visit Information  Last PT Received On: 08/29/12 Reason Eval/Treat Not Completed: Pain limiting ability to participate    Subjective Data  Subjective: Pt states that she has used crutches before and is familiar with them but does not have a pair in possession at this time. Patient Stated Goal: to have less pain and walk better   Prior Functioning  Home Living Lives With: Significant other Type of Home: House Home Access: Level entry Home Layout: One level Bathroom Shower/Tub: Engineer, manufacturing systems: Standard Home Adaptive Equipment: None Prior Function Level of Independence: Independent Able to Take Stairs?: Yes Driving: Yes Vocation: Full time employment Comments: ON FMLA Communication Communication: No difficulties    Cognition  Cognition Arousal/Alertness: Awake/alert Behavior During Therapy: WFL for tasks assessed/performed Overall Cognitive Status: Within Functional Limits for tasks assessed    Extremity/Trunk Assessment Right Lower Extremity Assessment RLE ROM/Strength/Tone: Within functional levels Left Lower Extremity Assessment LLE ROM/Strength/Tone: Unable to fully assess   Balance    End of Session PT - End of Session Equipment Utilized During Treatment: Gait belt  Activity Tolerance: Patient limited by fatigue;Patient limited by pain Patient left: in bed;with call bell/phone within reach;with family/visitor present  GP      Karen Mercer,Karen Mercer 08/29/2012, 6:12 PM

## 2012-08-29 NOTE — Brief Op Note (Signed)
08/29/2012  10:11 AM  PATIENT:  Karen Mercer  41 y.o. female  PRE-OPERATIVE DIAGNOSIS:  left patella instability  POST-OPERATIVE DIAGNOSIS:  left patella instability  PROCEDURE:  Procedure(s): Arthroscopic lateral release with chondroplasty left knee (Left) Open proximal realignment fulkerson (Left) Correction of procedure arthroscopy with lateral release and chondroplasty medial femoral condyle                                        Open proximal realignment, open Fulkerson tibial tubercle transfer  Operative findings cartilaginous debris was in the joint severe. Grade 3 chondral lesion medial femoral condyle. Lateral subluxation of the patella. Normal medial and lateral meniscus normal anterior cruciate ligament and PCL  Exam under anesthesia revealed ligamentous laxity with severe hyperextension of the knee and laxity of the posterior capsule with a stable Lachman test and posterior drawer test and pivot shift test. Patella subluxation of either the trochlear groove and 20 flexion SURGEON:  Surgeon(s) and Role:    * Elaine Roanhorse E Even Budlong, MD - Primary  PHYSICIAN ASSISTANT:   ASSISTANTS: Cynthia Wrenn   ANESTHESIA:   general  EBL:  Total I/O In: 600 [I.V.:600] Out: 50 [Blood:50]  BLOOD ADMINISTERED:none  DRAINS: One Hemovac   LOCAL MEDICATIONS USED:  MARCAINE with dilute epinephrine    and Amount: 90 ml  SPECIMEN:  No Specimen  DISPOSITION OF SPECIMEN:  N/A  COUNTS:  YES  TOURNIQUET:   Total Tourniquet Time Documented: Thigh (Left) - 80 minutes Total: Thigh (Left) - 80 minutes   DICTATION: .Dragon Dictation  PLAN OF CARE: Admit for overnight observation  PATIENT DISPOSITION:  PACU - hemodynamically stable.   Delay start of Pharmacological VTE agent (>24hrs) due to surgical blood loss or risk of bleeding: yes  Description of procedure The patient was identified in the preop holding area and the left knee and leg were confirmed as a surgical site marked as  such, chart review was completed  The patient was taken to the operating room for general anesthesia followed by exam under anesthesia and appropriate dose based Ancef  The timeout was executed  The procedure began with a diagnostic arthroscopy with the scope in the lateral portal. A significant amount of cartilaginous debris was noted in the joint and this was removed with suction and by irrigation. After diagnostic arthroscopy was completed we noted a large grade 3 chondral lesion over the medial femoral condyle. The patella and trochlea were otherwise normal. We also noted lateral subluxation of the patella in relation to the lateral trochlea.  Through a medial portal a probe was placed in the menisci were evaluated and found to be intact anterior cruciate ligament intact PCL intact.  A lateral release was performed arthroscopically  The scope was then removed from the joint the tourniquet was inflated after exsanguination of the limb with a six-inch Esmarch. A medial incision was made between the medial epicondyle on the lateral border of the patella. The subcutaneous tissues taken down to the medial patellofemoral ligament. The ligament was divided shortened and then 3 nonabsorbable Ethibond #2 sutures were placed and tied to reef the medial patellofemoral ligament. The knee was then taken through flexion extension and the tracking was found to be improved. However, based on the patient's ligament laxity and previous operation on the right knee this was not adequate to stabilize patella long term.  Therefore we made a lateral incision starting   at the lateral arthroscopic portal and brought it across tibial crest into the anterior compartment to perform subperiosteal dissection to expose it. We then isolated the medial lateral portals the patella tendon and then using the Arthrex tibial tubercle guide with a 45 angle we performed a tibial tubercle osteotomy we used osteotomes to complete the  osteotomy and then moved the tibial tubercle medially. We stabilized the tubercle with a pin moving a 1.2 cm. This was measured with a ruler.  The knee was flexed and extended and the patella was found to track normally in the knee was stable.  [2] 4.5 mm screws were placed using lag technique using a 3.2, and 4.5 drill bit and countersink.   The knee and the incisions were then irrigated the proximal medial wound was closed with 2-0 Monocryl and staples. Fasciotomy was performed on the anterior compartment and the lateral incision was closed with 2-0 Monocryl sutures over a suction drain. Skin approximation with staples  A total of 90 cc of Marcaine was injected around the knee and the incisions to assist with postoperative anesthesia.  After the sterile dressing was applied a TED hose was placed in a knee immobilizer was placed.  The patient was extubated and taken to recovery room in stable condition 

## 2012-08-29 NOTE — Transfer of Care (Signed)
Immediate Anesthesia Transfer of Care Note  Patient: Karen Mercer  Procedure(s) Performed: Procedure(s): Arthroscopic lateral release with chondroplasty left knee (Left) Open proximal realignment fulkerson (Left)  Patient Location: PACU  Anesthesia Type:General  Level of Consciousness: awake, alert  and oriented  Airway & Oxygen Therapy: Patient Spontanous Breathing and Patient connected to face mask oxygen  Post-op Assessment: Report given to PACU RN  Post vital signs: Reviewed and stable  Complications: No apparent anesthesia complications

## 2012-08-29 NOTE — Op Note (Signed)
08/29/2012  10:11 AM  PATIENT:  Karen Mercer  42 y.o. female  PRE-OPERATIVE DIAGNOSIS:  left patella instability  POST-OPERATIVE DIAGNOSIS:  left patella instability  PROCEDURE:  Procedure(s): Arthroscopic lateral release with chondroplasty left knee (Left) Open proximal realignment fulkerson (Left) Correction of procedure arthroscopy with lateral release and chondroplasty medial femoral condyle                                        Open proximal realignment, open Fulkerson tibial tubercle transfer  Operative findings cartilaginous debris was in the joint severe. Grade 3 chondral lesion medial femoral condyle. Lateral subluxation of the patella. Normal medial and lateral meniscus normal anterior cruciate ligament and PCL  Exam under anesthesia revealed ligamentous laxity with severe hyperextension of the knee and laxity of the posterior capsule with a stable Lachman test and posterior drawer test and pivot shift test. Patella subluxation of either the trochlear groove and 20 flexion SURGEON:  Surgeon(s) and Role:    * [name], MD - Primary  PHYSICIAN ASSISTANT:   ASSISTANTS: Cecile Sheerer   ANESTHESIA:   general  EBL:  Total I/O In: 600 [I.V.:600] Out: 50 [Blood:50]  BLOOD ADMINISTERED:none  DRAINS: One Hemovac   LOCAL MEDICATIONS USED:  MARCAINE with dilute epinephrine    and Amount: 90 ml  SPECIMEN:  No Specimen  DISPOSITION OF SPECIMEN:  N/A  COUNTS:  YES  TOURNIQUET:   Total Tourniquet Time Documented: Thigh (Left) - 80 minutes Total: Thigh (Left) - 80 minutes   DICTATION: .Reubin Milan Dictation  PLAN OF CARE: Admit for overnight observation  PATIENT DISPOSITION:  PACU - hemodynamically stable.   Delay start of Pharmacological VTE agent (>24hrs) due to surgical blood loss or risk of bleeding: yes  Description of procedure The patient was identified in the preop holding area and the left knee and leg were confirmed as a surgical site marked as  such, chart review was completed  The patient was taken to the operating room for general anesthesia followed by exam under anesthesia and appropriate dose based Ancef  The timeout was executed  The procedure began with a diagnostic arthroscopy with the scope in the lateral portal. A significant amount of cartilaginous debris was noted in the joint and this was removed with suction and by irrigation. After diagnostic arthroscopy was completed we noted a large grade 3 chondral lesion over the medial femoral condyle. The patella and trochlea were otherwise normal. We also noted lateral subluxation of the patella in relation to the lateral trochlea.  Through a medial portal a probe was placed in the menisci were evaluated and found to be intact anterior cruciate ligament intact PCL intact.  A lateral release was performed arthroscopically  The scope was then removed from the joint the tourniquet was inflated after exsanguination of the limb with a six-inch Esmarch. A medial incision was made between the medial epicondyle on the lateral border of the patella. The subcutaneous tissues taken down to the medial patellofemoral ligament. The ligament was divided shortened and then 3 nonabsorbable Ethibond #2 sutures were placed and tied to reef the medial patellofemoral ligament. The knee was then taken through flexion extension and the tracking was found to be improved. However, based on the patient's ligament laxity and previous operation on the right knee this was not adequate to stabilize patella long term.  Therefore we made a lateral incision starting  at the lateral arthroscopic portal and brought it across tibial crest into the anterior compartment to perform subperiosteal dissection to expose it. We then isolated the medial lateral portals the patella tendon and then using the Arthrex tibial tubercle guide with a 45 angle we performed a tibial tubercle osteotomy we used osteotomes to complete the  osteotomy and then moved the tibial tubercle medially. We stabilized the tubercle with a pin moving a 1.2 cm. This was measured with a ruler.  The knee was flexed and extended and the patella was found to track normally in the knee was stable.  [2] 4.5 mm screws were placed using lag technique using a 3.2, and 4.5 drill bit and countersink.   The knee and the incisions were then irrigated the proximal medial wound was closed with 2-0 Monocryl and staples. Fasciotomy was performed on the anterior compartment and the lateral incision was closed with 2-0 Monocryl sutures over a suction drain. Skin approximation with staples  A total of 90 cc of Marcaine was injected around the knee and the incisions to assist with postoperative anesthesia.  After the sterile dressing was applied a TED hose was placed in a knee immobilizer was placed.  The patient was extubated and taken to recovery room in stable condition

## 2012-08-29 NOTE — Anesthesia Procedure Notes (Signed)
Procedure Name: LMA Insertion Date/Time: 08/29/2012 7:41 AM Performed by: Glynn Octave E Pre-anesthesia Checklist: Patient identified, Patient being monitored, Emergency Drugs available, Timeout performed and Suction available Patient Re-evaluated:Patient Re-evaluated prior to inductionOxygen Delivery Method: Circle System Utilized Preoxygenation: Pre-oxygenation with 100% oxygen Intubation Type: IV induction Ventilation: Mask ventilation without difficulty LMA: LMA inserted LMA Size: 3.0 Number of attempts: 1 Placement Confirmation: positive ETCO2 and breath sounds checked- equal and bilateral

## 2012-08-29 NOTE — Anesthesia Postprocedure Evaluation (Addendum)
  Anesthesia Post-op Note  Patient: Karen Mercer  Procedure(s) Performed: Procedure(s): Arthroscopic lateral release with chondroplasty left knee (Left) Open proximal realignment fulkerson (Left)  Patient Location: PACU  Anesthesia Type:General  Level of Consciousness: awake, alert  and oriented  Airway and Oxygen Therapy: Patient Spontanous Breathing and Patient connected to face mask oxygen  Post-op Pain: severe  Post-op Assessment: Post-op Vital signs reviewed, Patient's Cardiovascular Status Stable, Respiratory Function Stable, Patent Airway and No signs of Nausea or vomiting  Post-op Vital Signs: Reviewed and stable  Complications: No apparent anesthesia complications 08/31/12  Patient feeling much better. Has no recall of procedure, but did have problems getting pain to a tolerable level.  Received best response when adding Toradol. VSS.  No apparent anesthesia complications.

## 2012-08-29 NOTE — Anesthesia Preprocedure Evaluation (Signed)
Anesthesia Evaluation  Patient identified by MRN, date of birth, ID band Patient awake    Reviewed: Allergy & Precautions, H&P , NPO status , Patient's Chart, lab work & pertinent test results  Airway Mallampati: I TM Distance: >3 FB Neck ROM: Full    Dental no notable dental hx.    Pulmonary Current Smoker,    Pulmonary exam normal       Cardiovascular negative cardio ROS  Rhythm:Regular Rate:Normal     Neuro/Psych negative neurological ROS  negative psych ROS   GI/Hepatic negative GI ROS, Neg liver ROS,   Endo/Other  negative endocrine ROS  Renal/GU negative Renal ROS  negative genitourinary   Musculoskeletal negative musculoskeletal ROS (+)   Abdominal   Peds  Hematology negative hematology ROS (+)   Anesthesia Other Findings   Reproductive/Obstetrics negative OB ROS                           Anesthesia Physical Anesthesia Plan  ASA: II  Anesthesia Plan: General   Post-op Pain Management:    Induction: Intravenous  Airway Management Planned: LMA  Additional Equipment:   Intra-op Plan:   Post-operative Plan: Extubation in OR  Informed Consent: I have reviewed the patients History and Physical, chart, labs and discussed the procedure including the risks, benefits and alternatives for the proposed anesthesia with the patient or authorized representative who has indicated his/her understanding and acceptance.     Plan Discussed with: CRNA  Anesthesia Plan Comments:         Anesthesia Quick Evaluation

## 2012-08-29 NOTE — Progress Notes (Signed)
08/29/12 1523 Patient c/o nausea this afternoon. Had not received scheduled dose of zofran at 1215. Notified Dr Romeo Apple, stated okay to give scheduled dose late. zofran given as ordered at 1403. Pt also requested bed be kept unplugged from wall, stated unable to tolerate mattress inflating/deflating. Discussed purpose of pressure reducing mattress, stated understood and preferred bed unplugged. Chair alarm placed under bed pad for safety due to fall risk. Discussed fall prevention/safety and need for alarm due to surgery and receiving pain medication. Instructed to call for assistance as needed and not attempt getting up on her own. Call light and phone within reach. Stated understood and would call. Earnstine Regal, RN

## 2012-08-30 MED ORDER — ENOXAPARIN SODIUM 30 MG/0.3ML ~~LOC~~ SOLN
30.0000 mg | Freq: Two times a day (BID) | SUBCUTANEOUS | Status: DC
  Administered 2012-08-30 – 2012-08-31 (×3): 30 mg via SUBCUTANEOUS
  Filled 2012-08-30 (×3): qty 0.3

## 2012-08-30 MED ORDER — OXYCODONE-ACETAMINOPHEN 5-325 MG PO TABS
1.0000 | ORAL_TABLET | ORAL | Status: DC
  Administered 2012-08-30 – 2012-08-31 (×6): 1 via ORAL
  Filled 2012-08-30 (×6): qty 1

## 2012-08-30 MED ORDER — KETOROLAC TROMETHAMINE 30 MG/ML IJ SOLN
30.0000 mg | Freq: Four times a day (QID) | INTRAMUSCULAR | Status: DC
  Administered 2012-08-30 – 2012-08-31 (×4): 30 mg via INTRAVENOUS
  Filled 2012-08-30 (×4): qty 1

## 2012-08-30 NOTE — Progress Notes (Signed)
Physical Therapy Treatment Patient Details Name: Karen Mercer MRN: [id number] DOB: 1971-01-11 Today's Date: 08/30/2012 Time: 1000-1028 PT Time Calculation (min): 28 min  PT Assessment / Plan / Recommendation Comments on Treatment Session  Therex for Lt knee strengthening and ROM.  Pt with supervision with bed mobility.  Cueing for handplacement to assist with sit to stand with crutches.  Increased gait training distance with crutches and supervision, Lt LE TTWB.  Pt left in chair with family and nurse in room, call bell within reach.  Pt given HEP worksheet for strengthening exercises.    Follow Up Recommendations        Does the patient have the potential to tolerate intense rehabilitation     Barriers to Discharge        Equipment Recommendations       Recommendations for Other Services    Frequency     Plan      Precautions / Restrictions Precautions Precautions: None Required Braces or Orthoses: Knee Immobilizer - Left Knee Immobilizer - Left: On when out of bed or walking Restrictions Weight Bearing Restrictions: Yes LLE Weight Bearing: Touchdown weight bearing   Pertinent Vitals/Pain     Mobility  Bed Mobility Supine to Sit: 5: Supervision Transfers Transfers: Sit to Stand;Stand to Sit Sit to Stand: From bed;From toilet Stand to Sit: To chair/3-in-1;To toilet Details for Transfer Assistance: cueing for handplacement with crutches with sit <-> stand Ambulation/Gait Ambulation/Gait Assistance: 5: Supervision Ambulation Distance (Feet): 110 Feet Assistive device: Crutches Gait Pattern: Step-through pattern;Decreased step length - right;Decreased step length - left (TTWB Lt LE) Gait velocity: slow TTWB only Stairs: No    Exercises Total Joint Exercises Ankle Circles/Pumps: AROM;Both;10 reps Quad Sets: AROM;Left;10 reps Short Arc QuadBarbaraann Mercer;Left;5 reps Heel Slides: AAROM;Left;10 reps   PT Diagnosis:    PT Problem List:   PT Treatment Interventions:      PT Goals    Visit Information  Last PT Received On: 08/30/12    Subjective Data  Subjective: Pt reported knee feeling okay, pain scale 5/10.  Goes home tomorrow   Cognition  Cognition Arousal/Alertness: Awake/alert Behavior During Therapy: WFL for tasks assessed/performed Overall Cognitive Status: Within Functional Limits for tasks assessed    Balance     End of Session PT - End of Session Equipment Utilized During Treatment: Gait belt Activity Tolerance: Patient tolerated treatment well;Patient limited by fatigue Patient left: in chair;with call bell/phone within reach;with nursing in room Nurse Communication: Mobility status   GP     Juel Burrow 08/30/2012, 10:30 AM

## 2012-08-30 NOTE — Progress Notes (Addendum)
08/30/12 1302 Patient requested "can I go outside and smoke". Discussed no smoking policy, offered to call MD for nicotine patch order. Pt refused. Reinforced no smoking policy as needed. Also reviewed fall prevention/safety education. Instructed to call for assistance and not attempt getting up on her own. Stated would call. Up in chair this morning, states crutches have been working well. Call light and phone within reach. Earnstine Regal, RN

## 2012-08-30 NOTE — Progress Notes (Signed)
Utilization Review Completed.   Jojuan Champney, RN, BSN Nurse Case Manager  [phone]  

## 2012-08-30 NOTE — Progress Notes (Signed)
08/30/12 1553 Discussed use of incentive spirometer with patient this morning, pt verbalized understanding and demonstrates correct use. States incentive spirometer volume 1500 ml consistently this afternoon. Earnstine Regal, RN

## 2012-08-30 NOTE — Progress Notes (Signed)
Subjective: 1 Day Post-Op Procedure(s) (LRB): Arthroscopic lateral release with chondroplasty left knee (Left) Open proximal realignment fulkerson (Left) Patient reports pain as 5 on 0-10 scale.    Objective: Vital signs in last 24 hours: Temp:  [97.5 F (36.4 C)-98.4 F (36.9 C)] 97.8 F (36.6 C) (04/26 0913) Pulse Rate:  [52-88] 65 (04/26 0913) Resp:  [11-24] 19 (04/26 0913) BP: (111-134)/(66-86) 132/81 mmHg (04/26 0913) SpO2:  [95 %-100 %] 99 % (04/26 0913)  Intake/Output from previous day: 04/25 0701 - 04/26 0700 In: 4530 [P.O.:1180; I.V.:2800; IV Piggyback:550] Out: 1100 [Urine:1000; Drains:50; Blood:50] Intake/Output this shift: Total I/O In: 240 [P.O.:240] Out: -   No results found for this basename: HGB,  in the last 72 hours No results found for this basename: WBC, RBC, HCT, PLT,  in the last 72 hours No results found for this basename: NA, K, CL, CO2, BUN, CREATININE, GLUCOSE, CALCIUM,  in the last 72 hours No results found for this basename: LABPT, INR,  in the last 72 hours  Neurologically intact Incision: dressing C/D/I Compartment soft  Assessment/Plan: 1 Day Post-Op Procedure(s) (LRB): Arthroscopic lateral release with chondroplasty left knee (Left) Open proximal realignment fulkerson (Left) Advance diet Up with therapy D/C IV fluids Add toradol to control throbbing  D/C in AM   [facility] 08/30/2012, 9:42 AM

## 2012-08-31 MED ORDER — IBUPROFEN 800 MG PO TABS: 800.0000 mg | ORAL_TABLET | Freq: Three times a day (TID) | ORAL | Status: DC | PRN

## 2012-08-31 MED ORDER — OXYCODONE-ACETAMINOPHEN 5-325 MG PO TABS: 1.0000 | ORAL_TABLET | ORAL | Status: DC

## 2012-08-31 NOTE — Discharge Summary (Signed)
Physician Discharge Summary  Patient ID: Karen Mercer MRN: [id number] DOB/AGE: [birth year] 42 y.o.  Admit date: 08/29/2012 Discharge date: 08/31/2012  Admission Diagnoses: The primary encounter diagnosis was SUBLUXATION PATELLAR (MALALIGNMENT). Diagnoses of Patellar instability of left knee, Chondromalacia of left knee, and Effusion of knee joint, left were also pertinent to this visit.   Discharge Diagnoses: Same Active Problems:   Patellar malalignment syndrome   Knee instability   Dislocation of patella   Chondromalacia of patella   Chondromalacia of left knee   Discharged Condition: good  Hospital Course: The patient was admitted for subluxation and patellofemoral malalignment with chronically dislocating patella. She underwent arthroscopic lateral release with open medial reefing proximally. She underwent open distal tibial tubercle transfer via Fulkerson technique. Should intra-articular cartilage damage over the medial femoral condyle and underwent arthroscopic debridement with a chondroplasty.  She did postoperative pain control physical therapy and gait training she tolerated that well was discharged home to touch weightbearing to nonweightbearing in a straight leg brace   Discharge Exam: Blood pressure 127/86, pulse 65, temperature 98.3 F (36.8 C), temperature source Oral, resp. rate 20, height 5\' 2"  (1.575 m), weight 135 lb (61.236 kg), last menstrual period 08/05/2012, SpO2 98.00%. The patient is awake alert and oriented x3 mood and affect were normal she was in no distress pain was well-controlled. Compartments were soft wound is cleanPatient is ambulatory with a set of crutches.   Disposition: 01-Home or Self Care  Discharge Orders   Future Appointments Provider Department Dept Phone   09/01/2012 2:30 PM [name], MD [facility] [phone]   Future Orders Complete By Expires     Call MD / Call 911  As directed     Comments:      If you experience chest pain or shortness of breath, CALL 911 and be transported to the hospital emergency room.  If you develope a fever above 101 F, pus (white drainage) or increased drainage or redness at the wound, or calf pain, call your surgeon's office.    Constipation Prevention  As directed     Comments:      Drink plenty of fluids.  Prune juice may be helpful.  You may use a stool softener, such as Colace (over the counter) 100 mg twice a day.  Use MiraLax (over the counter) for constipation as needed.    Diet - low sodium heart healthy  As directed     Discharge instructions  As directed     Comments:      Wear brace when walking  Its ok for your foot to be on the floor when sitting  Apply ice to the knee as needed for swelling   No driving and  No shower   Apply as little weight as possible on the left leg as possible when walking    Increase activity slowly as tolerated  As directed         Medication List    STOP taking these medications       HYDROcodone-acetaminophen 7.5-325 MG per tablet  Commonly known as:  NORCO      TAKE these medications       ibuprofen 800 MG tablet  Commonly known as:  ADVIL,MOTRIN  Take 1 tablet (800 mg total) by mouth every 8 (eight) hours as needed for pain.     oxyCODONE-acetaminophen 5-325 MG per tablet  Commonly known as:  PERCOCET/ROXICET  Take 1 tablet by mouth every 4 (four) hours.  Follow-up Information   Follow up with [name], MD On 09/08/2012. (remove staples )    Contact information:   [address], [address] [name] [address] [phone]       Signed: [name] 08/31/2012, 8:58 AM

## 2012-08-31 NOTE — Progress Notes (Signed)
Patient discharged home.  Already has crutches that have been fitted.  Instructed on new pain medication and how to use.  Instructed to call MD if any fever, puss from incisions, or intolerable pain occurs.  To follow up and have staples removed on May 5th.  IV removed - WNL.  Verbalizes understanding of Toe touch weight bearing and physical activity guidelines.  No questions at this time.  Stable to discharge

## 2012-09-01 ENCOUNTER — Encounter: Payer: Self-pay | Admitting: Orthopedic Surgery

## 2012-09-01 ENCOUNTER — Encounter (HOSPITAL_COMMUNITY): Payer: Self-pay | Admitting: Orthopedic Surgery

## 2012-09-01 ENCOUNTER — Ambulatory Visit: Payer: BC Managed Care – PPO | Admitting: Orthopedic Surgery

## 2012-09-02 ENCOUNTER — Telehealth: Payer: Self-pay | Admitting: Orthopedic Surgery

## 2012-09-02 NOTE — Telephone Encounter (Signed)
Patient called today, following discharge to home (08/31/12) from surgery done on 08/29/12.  Asking if okay to change bandage and "clean around" surgical site.  Her first post op appointment is scheduled 09/08/12.  Ph # [phone] (Home).

## 2012-09-03 NOTE — Telephone Encounter (Signed)
Advised patient that she could apply clean bandage, but not to get the incision wet or put any type of soap on it.

## 2012-09-08 ENCOUNTER — Ambulatory Visit (INDEPENDENT_AMBULATORY_CARE_PROVIDER_SITE_OTHER): Payer: BC Managed Care – PPO | Admitting: Orthopedic Surgery

## 2012-09-08 ENCOUNTER — Encounter: Payer: Self-pay | Admitting: Orthopedic Surgery

## 2012-09-08 VITALS — BP 114/82 | Ht 62.0 in | Wt 140.0 lb

## 2012-09-08 DIAGNOSIS — M238X9 Other internal derangements of unspecified knee: Secondary | ICD-10-CM

## 2012-09-08 DIAGNOSIS — M25362 Other instability, left knee: Secondary | ICD-10-CM

## 2012-09-08 DIAGNOSIS — M24469 Recurrent dislocation, unspecified knee: Secondary | ICD-10-CM

## 2012-09-08 DIAGNOSIS — M25569 Pain in unspecified knee: Secondary | ICD-10-CM

## 2012-09-08 MED ORDER — OXYCODONE-ACETAMINOPHEN 5-325 MG PO TABS: 1.0000 | ORAL_TABLET | ORAL | Status: DC

## 2012-09-08 NOTE — Progress Notes (Signed)
Patient ID: Karen Mercer, female   DOB: 08/10/1970, 42 y.o.   MRN: [id number] Chief Complaint  Patient presents with  . Follow-up    Post op #1, Left knee, staples out. DOS 08-29-12.    Postop Fulkerson procedure proximal open realignment and lateral release.  Minimal swelling compartments are soft wounds are clean Staples removed Steri-Strips applied start weightbearing with brace followup 2 weeks

## 2012-09-08 NOTE — Patient Instructions (Addendum)
Brace weight bear as tolerated

## 2012-09-23 ENCOUNTER — Ambulatory Visit (INDEPENDENT_AMBULATORY_CARE_PROVIDER_SITE_OTHER): Payer: BC Managed Care – PPO | Admitting: Orthopedic Surgery

## 2012-09-23 VITALS — Ht 62.0 in | Wt 140.0 lb

## 2012-09-23 DIAGNOSIS — M25462 Effusion, left knee: Secondary | ICD-10-CM

## 2012-09-23 DIAGNOSIS — M25469 Effusion, unspecified knee: Secondary | ICD-10-CM

## 2012-09-23 DIAGNOSIS — M2500 Hemarthrosis, unspecified joint: Secondary | ICD-10-CM

## 2012-09-23 MED ORDER — OXYCODONE-ACETAMINOPHEN 5-325 MG PO TABS: 1.0000 | ORAL_TABLET | ORAL | Status: DC

## 2012-09-23 NOTE — Patient Instructions (Addendum)
Continue ibuprofen and percocet   Continue ice and bracing   Ice the knee 4 x a day   Resume crutches

## 2012-09-23 NOTE — Progress Notes (Signed)
Patient ID: Karen Mercer, female   DOB: 11/01/1970, 42 y.o.   MRN: [id number] Chief Complaint  Patient presents with  . Follow-up    2 week recheck on left knee post op recheck #2. DOS 08-28-12.    The patient is 4 weeks postop from a Fulkerson procedure with a proximal medial patellofemoral ligament reefing  She complains of pain in her left knee actually asked for stronger dose of Percocet. She says her knee is buckling. I can understand that because she is supposed to be on crutches and have her brace on. She comes in with a cane knee immobilizer and a knee joint effusion with a stable wound intact no redness or erythema  I withdrew 45 cc of amber colored fluid consistent with effusion and hemarthrosis  I advised her to ice the knee 4 times a day which I don't think she was doing resume crutches continue brace and continue Percocet 5 mg am and that should be plenty of medication.

## 2012-10-07 ENCOUNTER — Encounter: Payer: Self-pay | Admitting: Orthopedic Surgery

## 2012-10-07 ENCOUNTER — Ambulatory Visit (INDEPENDENT_AMBULATORY_CARE_PROVIDER_SITE_OTHER): Payer: BC Managed Care – PPO

## 2012-10-07 ENCOUNTER — Ambulatory Visit (INDEPENDENT_AMBULATORY_CARE_PROVIDER_SITE_OTHER): Payer: BC Managed Care – PPO | Admitting: Orthopedic Surgery

## 2012-10-07 VITALS — BP 136/88 | Ht 62.0 in | Wt 140.0 lb

## 2012-10-07 DIAGNOSIS — Z9889 Other specified postprocedural states: Secondary | ICD-10-CM

## 2012-10-07 DIAGNOSIS — M25469 Effusion, unspecified knee: Secondary | ICD-10-CM

## 2012-10-07 DIAGNOSIS — M25462 Effusion, left knee: Secondary | ICD-10-CM

## 2012-10-07 DIAGNOSIS — M2500 Hemarthrosis, unspecified joint: Secondary | ICD-10-CM

## 2012-10-07 MED ORDER — OXYCODONE-ACETAMINOPHEN 7.5-325 MG PO TABS: 1.0000 | ORAL_TABLET | ORAL | Status: DC | PRN

## 2012-10-07 NOTE — Patient Instructions (Addendum)
Start therapy  Wear new brace

## 2012-10-07 NOTE — Progress Notes (Signed)
Patient ID: Karen Mercer, female   DOB: 05-Jun-1970, 42 y.o.   MRN: [id number] Chief Complaint  Patient presents with  . Follow-up    Recheck left knee. DOS 08-28-12. Xrays today.     Six-week postop x-ray shows healing of the shingle with no complications of the hardware. Slight knee effusion noted. Incision healed.  Patient is to start physical therapy, start hinged knee brace continue crutches until therapy begins  Pain medication changed to Percocet 7.5 mg  Patient scheduled for six-week followup

## 2012-10-21 ENCOUNTER — Ambulatory Visit (INDEPENDENT_AMBULATORY_CARE_PROVIDER_SITE_OTHER): Payer: BC Managed Care – PPO | Admitting: Orthopedic Surgery

## 2012-10-21 VITALS — BP 122/82 | Ht 62.0 in | Wt 140.0 lb

## 2012-10-21 DIAGNOSIS — M2500 Hemarthrosis, unspecified joint: Secondary | ICD-10-CM

## 2012-10-21 DIAGNOSIS — M25462 Effusion, left knee: Secondary | ICD-10-CM

## 2012-10-21 DIAGNOSIS — M25469 Effusion, unspecified knee: Secondary | ICD-10-CM

## 2012-10-21 DIAGNOSIS — Z9889 Other specified postprocedural states: Secondary | ICD-10-CM

## 2012-10-21 MED ORDER — OXYCODONE-ACETAMINOPHEN 7.5-325 MG PO TABS: 1.0000 | ORAL_TABLET | ORAL | Status: DC | PRN

## 2012-10-21 NOTE — Progress Notes (Signed)
Patient ID: Karen Mercer, female   DOB: November 17, 1970, 42 y.o.   MRN: [id number] The patient is status post left knee proximal and distal realignment with Fulkerson procedure on April 25 comes in today with pain and swelling complains of throbbing.  Her knee looks really good except for joint effusion. Her flexion ARC is 120. She has full extension. The patella stable.  No signs of infection  I did aspirate the knee approximately 55 cc of yellow fluid injected cortisone  Change to prednisone 10 mg every 8 for swelling and throbbing continue ice  Recommend keep regular appointment I don't see any issues at this time  Verbal consent and timeout were completed for a left knee aspiration  After sterile prep and ethyl chloride for anesthesia we aspirated the knee 55 cc of yellow fluid injected 40 mg of cortisone Depo-Medrol and 1% lidocaine 3 cc tolerated well without complication patient will continue current treatment including followup as previously recorded

## 2012-10-22 ENCOUNTER — Ambulatory Visit (HOSPITAL_COMMUNITY): Payer: BC Managed Care – PPO | Admitting: Physical Therapy

## 2012-10-24 ENCOUNTER — Ambulatory Visit (HOSPITAL_COMMUNITY): Payer: BC Managed Care – PPO | Attending: Physical Therapy | Admitting: Physical Therapy

## 2012-10-31 ENCOUNTER — Encounter: Payer: Self-pay | Admitting: Orthopedic Surgery

## 2012-11-04 ENCOUNTER — Encounter: Payer: Self-pay | Admitting: Orthopedic Surgery

## 2012-11-06 ENCOUNTER — Telehealth: Payer: Self-pay | Admitting: Orthopedic Surgery

## 2012-11-06 NOTE — Telephone Encounter (Signed)
Faxed medical office notes and form to [employer], patient's short-term disability carrier, to Fax # [phone], attention Jasmine Pang, Ph# [phone].  Authorization has been signed and on file.  Patient aware of status.

## 2012-11-10 ENCOUNTER — Encounter: Payer: Self-pay | Admitting: Orthopedic Surgery

## 2012-11-10 ENCOUNTER — Ambulatory Visit (HOSPITAL_COMMUNITY)
Admission: RE | Admit: 2012-11-10 | Discharge: 2012-11-10 | Disposition: A | Discharge status: Home / Self Care | Payer: BC Managed Care – PPO | Source: Ambulatory Visit | Attending: Internal Medicine | Admitting: Internal Medicine

## 2012-11-10 ENCOUNTER — Ambulatory Visit (INDEPENDENT_AMBULATORY_CARE_PROVIDER_SITE_OTHER): Payer: BC Managed Care – PPO | Admitting: Orthopedic Surgery

## 2012-11-10 VITALS — BP 138/92 | Ht 62.0 in | Wt 140.0 lb

## 2012-11-10 DIAGNOSIS — Z9889 Other specified postprocedural states: Secondary | ICD-10-CM

## 2012-11-10 DIAGNOSIS — M2500 Hemarthrosis, unspecified joint: Secondary | ICD-10-CM

## 2012-11-10 DIAGNOSIS — R269 Unspecified abnormalities of gait and mobility: Secondary | ICD-10-CM | POA: Insufficient documentation

## 2012-11-10 DIAGNOSIS — IMO0001 Reserved for inherently not codable concepts without codable children: Secondary | ICD-10-CM | POA: Insufficient documentation

## 2012-11-10 DIAGNOSIS — M25561 Pain in right knee: Secondary | ICD-10-CM

## 2012-11-10 DIAGNOSIS — M24469 Recurrent dislocation, unspecified knee: Secondary | ICD-10-CM

## 2012-11-10 DIAGNOSIS — R29898 Other symptoms and signs involving the musculoskeletal system: Secondary | ICD-10-CM

## 2012-11-10 DIAGNOSIS — M238X9 Other internal derangements of unspecified knee: Secondary | ICD-10-CM

## 2012-11-10 DIAGNOSIS — M25469 Effusion, unspecified knee: Secondary | ICD-10-CM | POA: Insufficient documentation

## 2012-11-10 DIAGNOSIS — R262 Difficulty in walking, not elsewhere classified: Secondary | ICD-10-CM

## 2012-11-10 DIAGNOSIS — M25462 Effusion, left knee: Secondary | ICD-10-CM

## 2012-11-10 DIAGNOSIS — M6281 Muscle weakness (generalized): Secondary | ICD-10-CM | POA: Insufficient documentation

## 2012-11-10 DIAGNOSIS — M25569 Pain in unspecified knee: Secondary | ICD-10-CM | POA: Insufficient documentation

## 2012-11-10 DIAGNOSIS — M25362 Other instability, left knee: Secondary | ICD-10-CM

## 2012-11-10 MED ORDER — OXYCODONE-ACETAMINOPHEN 7.5-325 MG PO TABS: 1.0000 | ORAL_TABLET | ORAL | Status: DC | PRN

## 2012-11-10 NOTE — Evaluation (Addendum)
Physical Therapy Evaluation  Patient Details  Name: Karen Mercer MRN: [id number] Date of Birth: 02/23/1971 Charge:  Evaluation. Today's Date: 11/10/2012    Time: 7829-5621 PT Time Calculation (min): 35 min              Visit#: 1 of 18  Re-eval: 12/10/12 Assessment Diagnosis: L knee Surgical Date: 08/29/12 Next MD Visit: 11/10/2012  Authorization: [name]    Past Medical History: No past medical history on file. Past Surgical History:  Past Surgical History  Procedure Laterality Date  . Right arm      rods from fractured arm  . Tubal ligation    . Knee surgery  2012    right knee-arthroscopy  . Knee arthroscopy  08/17/2011    Procedure: ARTHROSCOPY KNEE;  Surgeon: [name], MD;  Location: [facility];  Service: Orthopedics;  Laterality: Right;  with tibia tubercle transfer and proximal realignment  . Knee arthroscopy with fulkerson slide Left 08/29/2012    Procedure: Arthroscopic lateral release with chondroplasty left knee;  Surgeon: [name], MD;  Location: [facility];  Service: Orthopedics;  Laterality: Left;  . Knee arthroscopy with medial patellar femoral ligament reconstruction Left 08/29/2012    Procedure: Open proximal realignment fulkerson;  Surgeon: [name], MD;  Location: [facility];  Service: Orthopedics;  Laterality: Left;    Subjective Symptoms/Limitations Symptoms: Pt states that she had  subluxing patella on her right side..  She opted to have a Fulkerson with medial reefing of her R knee on 08/18/2011.  The procedure was successful; she then  began having difficulty with her Left knee , therefore, she opted to have the procedure completed on her L knee on 08/29/2012.   She states that she has had to have fluid drawn off of her knee twice since the operation.  She has increased swelling  and pain and is concerned about returning to work next week.   She is being referred to therapy to improve her pain, ROM and strength.  How long can you sit comfortably?:  She is comfortable sitting for 20-30 mintues. How long can you stand comfortably?: Pt is able to stand for five to ten minutes.  How long can you walk comfortably?: Pt is using a knee brace and the longerst she has walked is less than two minutes at a time.   Special Tests: The patient is waking from two to ten times a night. Patient Stated Goals: To have less pain. Pain Assessment Currently in Pain?: Yes Pain Score: 5  (12/10 is the greatest ) Pain Location: Knee Pain Orientation: Left Pain Type: Surgical pain Pain Onset: 1 to 4 weeks ago Pain Frequency: Constant Pain Relieving Factors: ice, heat and elevation Effect of Pain on Daily Activities: increases pain.  Balance Screening  no falls  Prior Functioning  Prior Function Vocation: Full time employment Vocation Requirements: 10 hr shift on her feet all day.   Assessment LLE AROM (degrees) Left Knee Extension: 0 Lt Knee Flexion: 128 LLE Strength Lt Hip Flexion:  (4+/5) Lt Hip Extension: 3+/5 Lt Hip ABduction: 4/5 Lt Hip ADduction: 4/5 Lt Knee Flexion: 3+/5 Lt Knee Extension: 3+/5 Lt Ankle Dorsiflexion: 4/5  Exercise/Treatments Mobility/Balance  Static Standing Balance Single Leg Stance - Right Leg: 17 Single Leg Stance - Left Leg: 18    Standing Heel Raises: 5 reps Functional Squat: 10 reps Seated Long Arc Quad: 5 reps Supine Quad Sets: 5 reps Sidelying Hip ABduction: 5 reps Hip ABduction Limitations: 3# Hip  ADduction: 5 reps Prone  Hamstring Curl: 5 reps Hamstring Curl Limitations: 3# Hip Extension: 5 sets Hip Extension Limitations: 3#    Physical Therapy Assessment and Plan PT Assessment and Plan Clinical Impression Statement: Pt s/p Fulkerson procedure of the knee.  PT will benefit from skilled PT to increase strength  and decrease pain and swelling to maximize functional ability.   Pt will benefit from skilled therapeutic intervention in order to improve on the following deficits: Decreased  activity tolerance;Pain;Decreased balance;Decreased strength;Difficulty walking;Impaired perceived functional ability PT Frequency: Min 3X/week PT Duration: 6 weeks PT Treatment/Interventions: Therapeutic exercise;Balance training;Manual techniques;Modalities PT Plan: Pt to be seen for proprioception as well as strengthening of LE; focus on knee stability,     Goals Home Exercise Program Pt/caregiver will Perform Home Exercise Program: increased strength PT Goal: Perform Home Exercise Program - Progress: Goal set today PT Short Term Goals Time to Complete Short Term Goals: 2 weeks PT Short Term Goal 1: Pt to be able to walk for 30 minutes straight without increased pain for functional shopping, moving about. 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  Problem List Patient Active Problem List   Diagnosis Date Noted  . Effusion of knee joint 10/21/2012  . Status post knee surgery 10/21/2012  . 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  . Patellofemoral instability with pain 05/22/2012  . Dislocation of patella 01/16/2012  . Knee pain, bilateral 01/16/2012  . Difficulty in walking 10/31/2011  . Pain in right knee 10/31/2011  . Weakness of right leg 10/31/2011  . Knee pain 09/11/2011  . Stiffness of joint, not elsewhere classified, lower leg 09/11/2011  . Gait abnormality 09/11/2011  . Muscle weakness  (generalized) 09/11/2011  . Knee instability 07/12/2011  . 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 Patellar instability 09/14/2010  . HEMARTHROSIS 04/04/2010  . SUBLUXATION PATELLAR (MALALIGNMENT) 05/04/2009  . 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    PT Plan of Care PT Home Exercise Plan: given  GP    RUSSELL,CINDY 11/10/2012, 2:42 PM  Physician Documentation Your signature is required to indicate approval of the treatment plan as stated above.  Please sign and either send electronically or make a copy of this report for your files and return this physician signed original.   Please mark one 1.__approve of plan  2. ___approve of plan with the following conditions.   ______________________________                                                          _____________________ Physician Signature  Date  

## 2012-11-10 NOTE — Patient Instructions (Signed)
RTW WED July 9

## 2012-11-10 NOTE — Progress Notes (Signed)
Patient ID: Karen Mercer, female   DOB: Aug 25, 1970, 42 y.o.   MRN: [id number] Chief Complaint  Patient presents with  . Follow-up    Recheck left knee DOS 08/28/12 Evaluate for return to work    Status post left Fulkerson with a proximal realignment as well as medial reefing of the medial patellofemoral ligament. The patient says just go back to work on the ninth although she's not really ready to go. Her knee is quiet today small effusion is noted she has excellent flexion she can extend the knee she's made excellent progress. I like her to stay on a couple more weeks if possible but she will lose her job  She returned to work and I'll see her in 2 weeks to see what she's been able to accomplish at work and, she activity she's been able to tolerate

## 2012-11-12 ENCOUNTER — Inpatient Hospital Stay (HOSPITAL_COMMUNITY)
Admission: RE | Admit: 2012-11-12 | Payer: BC Managed Care – PPO | Source: Ambulatory Visit | Admitting: Physical Therapy

## 2012-11-14 ENCOUNTER — Ambulatory Visit (HOSPITAL_COMMUNITY)
Admission: RE | Admit: 2012-11-14 | Discharge: 2012-11-14 | Disposition: A | Discharge status: Home / Self Care | Payer: BC Managed Care – PPO | Source: Ambulatory Visit | Attending: Internal Medicine | Admitting: Internal Medicine

## 2012-11-14 NOTE — Progress Notes (Signed)
Physical Therapy Treatment Patient Details  Name: Karen Mercer MRN: [id number] Date of Birth: 1970/11/26  Today's Date: 11/14/2012 Time: 0802-0843 PT Time Calculation (min): 41 min Charge:  There ex 3;  8:02-8:43 Visit#: 2 of 18  Re-eval: 12/10/12    Authorization: [employer]   Subjective: Symptoms/Limitations Symptoms: Pt states that she did her exercises without difficulty. Pain Assessment Pain Score: 3  Pain Location: Knee Pain Orientation: Left    Exercise/Treatments    Aerobic Stationary Bike: Nustep hills L 3 x8' Machines for Strengthening Cybex Leg Press: 2 PL Lt LE only x 10   Standing Heel Raises: 10 reps Knee Flexion: Strengthening;Right;10 reps;Limitations Knee Flexion Limitations: 3# Terminal Knee Extension: 10 reps Lateral Step Up: 10 reps;Step Height: 2" Functional Squat: 10 reps Rocker Board: 2 minutes SLS:  (x3) Seated Long Arc Quad: 10 reps;Weights Long Arc Quad Weight: 3 lbs. Supine Bridges: Both;10 reps;Limitations Bridges Limitations: With adduction squeeze with ball    Physical Therapy Assessment and Plan PT Assessment and Plan Clinical Impression Statement: Pt completed new exercise program with verbal cuing needed for proper technique.   PT Plan: Continue with strengthening;  Begin forward step ups and forward lunges.      Problem List Patient Active Problem List   Diagnosis Date Noted  . Effusion of knee joint 10/21/2012  . Status post knee surgery 10/21/2012  . 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  . Patellofemoral instability with pain 05/22/2012  . Dislocation of patella 01/16/2012  . Knee pain, bilateral 01/16/2012  . Difficulty in walking 10/31/2011  . Pain in right knee 10/31/2011  . Weakness of right leg 10/31/2011  . Knee pain 09/11/2011  . Stiffness of joint, not elsewhere classified, lower leg 09/11/2011  . Gait abnormality  09/11/2011  . Muscle weakness (generalized) 09/11/2011  . Knee instability 07/12/2011  . 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 Patellar instability 09/14/2010  . HEMARTHROSIS 04/04/2010  . SUBLUXATION PATELLAR (MALALIGNMENT) 05/04/2009  . 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       GP    RUSSELL,CINDY 11/14/2012, 8:40 AM

## 2012-11-17 ENCOUNTER — Ambulatory Visit (HOSPITAL_COMMUNITY): Payer: BC Managed Care – PPO | Admitting: Physical Therapy

## 2012-11-18 ENCOUNTER — Ambulatory Visit: Payer: BC Managed Care – PPO | Admitting: Orthopedic Surgery

## 2012-11-19 ENCOUNTER — Ambulatory Visit (HOSPITAL_COMMUNITY): Payer: BC Managed Care – PPO | Admitting: *Deleted

## 2012-11-19 ENCOUNTER — Ambulatory Visit (HOSPITAL_COMMUNITY)
Admission: RE | Admit: 2012-11-19 | Discharge: 2012-11-19 | Disposition: A | Discharge status: Home / Self Care | Payer: BC Managed Care – PPO | Source: Ambulatory Visit | Attending: Internal Medicine | Admitting: Internal Medicine

## 2012-11-19 NOTE — Progress Notes (Signed)
Physical Therapy Treatment Patient Details  Name: Karen Mercer MRN: [id number] Date of Birth: [birth year]  Today's Date: 11/19/2012 Time: 1020-1100 PT Time Calculation (min): 40 min  Visit#: 3 of 18  Re-eval: 12/10/12 Charges: Therex x 38' (1022-1100)  Authorization: [employer]    Subjective: Symptoms/Limitations Symptoms: Pt reported pain free today. Pain Assessment Currently in Pain?: No/denies  Exercise/Treatments Aerobic Stationary Bike: Nustep hills L 3 x10'' Machines for Strengthening Cybex Leg Press: 2 PL Lt LE only x 10 Standing Heel Raises: 10 reps Knee Flexion: Strengthening;Right;10 reps;Limitations Knee Flexion Limitations: 3# Lateral Step Up: 10 reps;Step Height: 2";Right Functional Squat: 10 reps Rocker Board: 2 minutes SLS: SLS 1' Supine Bridges: 10 reps Straight Leg Raises: 10 reps  Physical Therapy Assessment and Plan PT Assessment and Plan Clinical Impression Statement: Pt compeltes therex well after inital cueing and demo. Pt displasy improved prorpioceptive contorl with SLS. Pt displasy minimal extensor lag with sLR secondary to quad weakness. Pt denies crytotherapy and state that she will ice knee once she is home. PT Plan: Continue with strengthening;  Begin forward step ups and forward lunges.     Problem List Patient Active Problem List   Diagnosis Date Noted  . Effusion of knee joint 10/21/2012  . Status post knee surgery 10/21/2012  . 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  . Patellofemoral instability with pain 05/22/2012  . Dislocation of patella 01/16/2012  . Knee pain, bilateral 01/16/2012  . Difficulty in walking 10/31/2011  . Pain in right knee 10/31/2011  . Weakness of right leg 10/31/2011  . Knee pain 09/11/2011  . Stiffness of joint, not elsewhere classified, lower leg 09/11/2011  . Gait abnormality 09/11/2011  . Muscle weakness  (generalized) 09/11/2011  . Knee instability 07/12/2011  . 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 Patellar instability 09/14/2010  . HEMARTHROSIS 04/04/2010  . SUBLUXATION PATELLAR (MALALIGNMENT) 05/04/2009  . 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    Seth Bake, PTA 11/19/2012, 12:19 PM

## 2012-11-21 ENCOUNTER — Ambulatory Visit (HOSPITAL_COMMUNITY): Payer: BC Managed Care – PPO | Admitting: Physical Therapy

## 2012-11-24 ENCOUNTER — Telehealth (HOSPITAL_COMMUNITY): Payer: Self-pay

## 2012-11-24 ENCOUNTER — Ambulatory Visit (HOSPITAL_COMMUNITY): Payer: BC Managed Care – PPO | Admitting: *Deleted

## 2012-11-25 ENCOUNTER — Ambulatory Visit (INDEPENDENT_AMBULATORY_CARE_PROVIDER_SITE_OTHER): Payer: BC Managed Care – PPO | Admitting: Orthopedic Surgery

## 2012-11-25 ENCOUNTER — Encounter: Payer: Self-pay | Admitting: Orthopedic Surgery

## 2012-11-25 VITALS — BP 142/98 | Ht 62.0 in | Wt 140.0 lb

## 2012-11-25 DIAGNOSIS — M2500 Hemarthrosis, unspecified joint: Secondary | ICD-10-CM

## 2012-11-25 DIAGNOSIS — Z9889 Other specified postprocedural states: Secondary | ICD-10-CM

## 2012-11-25 DIAGNOSIS — M25462 Effusion, left knee: Secondary | ICD-10-CM

## 2012-11-25 DIAGNOSIS — M25469 Effusion, unspecified knee: Secondary | ICD-10-CM

## 2012-11-25 MED ORDER — IBUPROFEN 800 MG PO TABS: 800.0000 mg | ORAL_TABLET | Freq: Three times a day (TID) | ORAL | Status: DC | PRN

## 2012-11-25 MED ORDER — OXYCODONE-ACETAMINOPHEN 7.5-325 MG PO TABS: 1.0000 | ORAL_TABLET | ORAL | Status: DC | PRN

## 2012-11-25 NOTE — Progress Notes (Signed)
Patient ID: Karen Mercer, female   DOB: May 11, 1970, 42 y.o.   MRN: [id number] Chief Complaint  Patient presents with  . Follow-up    2 week recheck on left knee. DOS 08-28-12.   12 weeks status post left Fulkerson procedure with reefing of the medial patellofemoral ligament and lateral release  The patient went back to work early was able to go back to work but is having a lot of pain in her knee plus or having a rain spell that is causing her knee to ache. She bracing at work with both knees as the other knee and the same procedure.  She does have a small joint effusion she's regained all of her range of motion her incisions have healed nicely. Her patella remains stable to valgus stress  Continue current medications ibuprofen Percocet 7.5 and bracing followup with me in one month

## 2012-11-26 ENCOUNTER — Ambulatory Visit (HOSPITAL_COMMUNITY)
Admission: RE | Admit: 2012-11-26 | Discharge: 2012-11-26 | Disposition: A | Discharge status: Home / Self Care | Payer: BC Managed Care – PPO | Source: Ambulatory Visit | Attending: Internal Medicine | Admitting: Internal Medicine

## 2012-11-26 ENCOUNTER — Ambulatory Visit (HOSPITAL_COMMUNITY): Payer: BC Managed Care – PPO | Admitting: *Deleted

## 2012-11-26 NOTE — Progress Notes (Signed)
Physical Therapy Treatment Patient Details  Name: Karen Mercer MRN: [id number] Date of Birth: Jan 03, 1971  Today's Date: 11/26/2012 Time: 1010-1050 PT Time Calculation (min): 40 min  Visit#: 4 of 18  Re-eval: 12/10/12 Charges: Therex x 40'(9811-9147)  Authorization: [employer]   Subjective: Symptoms/Limitations Symptoms: Pt states that she was having alot of pain earlier in the week due to the weather. No pain currently. Pain Assessment Currently in Pain?: No/denies   Exercise/Treatments Aerobic Stationary Bike: Nustep hills L 3 x10'' Machines for Strengthening Cybex Knee Extension: 1pl x10 BLE Cybex Leg Press: 2.5 PL Lt LE only x 10 Standing Lateral Step Up: 10 reps;Right;Step Height: 2" Forward Step Up: 10 reps;Step Height: 6";Left Functional Squat: 15 reps Stairs: 3 RT (1 step-to; 2 reciprocal) Rocker Board: 2 minutes Rebounder: L SLS x 10 w/red ball  Physical Therapy Assessment and Plan PT Assessment and Plan Clinical Impression Statement: Pt tolerates progression of exercises well. Pt is without compliant of increased pain throughout session. Pt displays improved stability with rocker board. Began stairs training with vc's to utilize reciprocal pattern. Pt tolerates progression of exercises well. Pt is without compliant of increased pain throughout session. Pt displays improved stability with rocker board. Began stairs training with vc's to utilize reciprocal pattern. Pt denies cryotherapy and states that she will ice knee once she is home. PT Plan: Continue with strengthening. Begin forward lunges next session.    Goals    Problem List Patient Active Problem List   Diagnosis Date Noted  . Effusion of knee joint 10/21/2012  . Status post knee surgery 10/21/2012  . 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  . Patellofemoral instability with pain 05/22/2012  . Dislocation of patella  01/16/2012  . Knee pain, bilateral 01/16/2012  . Difficulty in walking 10/31/2011  . Pain in right knee 10/31/2011  . Weakness of right leg 10/31/2011  . Knee pain 09/11/2011  . Stiffness of joint, not elsewhere classified, lower leg 09/11/2011  . Gait abnormality 09/11/2011  . Muscle weakness (generalized) 09/11/2011  . Knee instability 07/12/2011  . 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 Patellar instability 09/14/2010  . HEMARTHROSIS 04/04/2010  . SUBLUXATION PATELLAR (MALALIGNMENT) 05/04/2009  . 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   Seth Bake, PTA 11/26/2012, 10:58 AM

## 2012-11-28 ENCOUNTER — Ambulatory Visit (HOSPITAL_COMMUNITY): Payer: BC Managed Care – PPO | Admitting: Physical Therapy

## 2012-11-28 ENCOUNTER — Ambulatory Visit (HOSPITAL_COMMUNITY)
Admission: RE | Admit: 2012-11-28 | Discharge: 2012-11-28 | Disposition: A | Discharge status: Home / Self Care | Payer: BC Managed Care – PPO | Source: Ambulatory Visit | Attending: Internal Medicine | Admitting: Internal Medicine

## 2012-11-28 NOTE — Progress Notes (Signed)
Physical Therapy Treatment Patient Details  Name: Karen Mercer MRN: [id number] Date of Birth: 09-Oct-1970  Today's Date: 11/28/2012 Time: 1012-1055 PT Time Calculation (min): 43 min  Visit#: 5 of 18  Re-eval: 12/10/12 Assessment Surgical Date: 08/29/12 Next MD Visit: 12/26/12; Dr. Romeo Apple Authorization: [employer]  Charges:  therex 40'  Subjective: Symptoms/Limitations Symptoms: Pt states no problems.   Exercise/Treatments Machines for Strengthening Cybex Knee Extension: 1 1/2 pl x  Cybex Knee Flexion: 2.5 pl X10 reps Cybex Leg Press: 3pl x 10 LT Standing Heel Raises: 15 reps;Limitations Heel Raises Limitations: toeraises 15 reps Knee Flexion: Strengthening;Right;Limitations;15 reps Knee Flexion Limitations: 3# Side Lunges: Limitations Side Lunges Limitations: add next visit Lateral Step Up: 15 reps;Right;Step Height: 4" Forward Step Up: 15 reps;Step Height: 6";Left Step Down: 10 reps;Step Height: 4";Left;Hand Hold: 1 Stairs: 2 flights 1RT reciprocal with 1 HR SLS with Vectors: 3X10" holds Lt LE    Physical Therapy Assessment and Plan PT Assessment and Plan Clinical Impression Statement: Able to progress pt to 4" lateral step ups and add forward step downs to POC.  Also added cybex knee flexion and progressed to SLS with 10" hold vectors.  Overall improving strength and stability of Lt LE.  No complaints of pain during session.  Pt to ice as needed at home. PT Plan: Continue with strengthening. Begin forward lunges next session.     Problem List Patient Active Problem List   Diagnosis Date Noted  . Effusion of knee joint 10/21/2012  . Status post knee surgery 10/21/2012  . 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  . Patellofemoral instability with pain 05/22/2012  . Dislocation of patella 01/16/2012  . Knee pain, bilateral 01/16/2012  . Difficulty in walking 10/31/2011  . Pain  in right knee 10/31/2011  . Weakness of right leg 10/31/2011  . Knee pain 09/11/2011  . Stiffness of joint, not elsewhere classified, lower leg 09/11/2011  . Gait abnormality 09/11/2011  . Muscle weakness (generalized) 09/11/2011  . Knee instability 07/12/2011  . 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 Patellar instability 09/14/2010  . HEMARTHROSIS 04/04/2010  . SUBLUXATION PATELLAR (MALALIGNMENT) 05/04/2009  . 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      Lurena Nida, PTA/CLT 11/28/2012, 10:55 AM

## 2012-12-01 ENCOUNTER — Ambulatory Visit (HOSPITAL_COMMUNITY): Payer: BC Managed Care – PPO | Admitting: Physical Therapy

## 2012-12-03 ENCOUNTER — Ambulatory Visit (HOSPITAL_COMMUNITY): Payer: BC Managed Care – PPO | Admitting: Physical Therapy

## 2012-12-03 ENCOUNTER — Inpatient Hospital Stay (HOSPITAL_COMMUNITY): Admission: RE | Admit: 2012-12-03 | Payer: BC Managed Care – PPO | Source: Ambulatory Visit

## 2012-12-05 ENCOUNTER — Ambulatory Visit (HOSPITAL_COMMUNITY): Payer: BC Managed Care – PPO | Admitting: Physical Therapy

## 2012-12-05 ENCOUNTER — Ambulatory Visit (HOSPITAL_COMMUNITY): Payer: BC Managed Care – PPO

## 2012-12-08 ENCOUNTER — Ambulatory Visit (HOSPITAL_COMMUNITY): Payer: BC Managed Care – PPO | Admitting: *Deleted

## 2012-12-10 ENCOUNTER — Ambulatory Visit (HOSPITAL_COMMUNITY): Payer: BC Managed Care – PPO | Admitting: *Deleted

## 2012-12-10 ENCOUNTER — Inpatient Hospital Stay (HOSPITAL_COMMUNITY)
Admission: RE | Admit: 2012-12-10 | Discharge: 2012-12-10 | Disposition: A | Discharge status: Home / Self Care | Payer: BC Managed Care – PPO | Source: Ambulatory Visit | Attending: *Deleted | Admitting: *Deleted

## 2012-12-12 ENCOUNTER — Ambulatory Visit (HOSPITAL_COMMUNITY): Payer: BC Managed Care – PPO

## 2012-12-15 ENCOUNTER — Ambulatory Visit (HOSPITAL_COMMUNITY): Payer: BC Managed Care – PPO | Admitting: Physical Therapy

## 2012-12-17 ENCOUNTER — Ambulatory Visit (HOSPITAL_COMMUNITY): Payer: BC Managed Care – PPO | Admitting: *Deleted

## 2012-12-19 ENCOUNTER — Ambulatory Visit (HOSPITAL_COMMUNITY): Payer: BC Managed Care – PPO

## 2012-12-25 ENCOUNTER — Ambulatory Visit (INDEPENDENT_AMBULATORY_CARE_PROVIDER_SITE_OTHER): Payer: BC Managed Care – PPO | Admitting: Orthopedic Surgery

## 2012-12-25 ENCOUNTER — Encounter: Payer: Self-pay | Admitting: Orthopedic Surgery

## 2012-12-25 VITALS — BP 148/92 | Ht 62.0 in | Wt 140.0 lb

## 2012-12-25 DIAGNOSIS — Z9889 Other specified postprocedural states: Secondary | ICD-10-CM

## 2012-12-25 DIAGNOSIS — M25462 Effusion, left knee: Secondary | ICD-10-CM

## 2012-12-25 DIAGNOSIS — M25469 Effusion, unspecified knee: Secondary | ICD-10-CM

## 2012-12-25 DIAGNOSIS — M238X9 Other internal derangements of unspecified knee: Secondary | ICD-10-CM

## 2012-12-25 DIAGNOSIS — M25362 Other instability, left knee: Secondary | ICD-10-CM

## 2012-12-25 DIAGNOSIS — M2500 Hemarthrosis, unspecified joint: Secondary | ICD-10-CM

## 2012-12-25 DIAGNOSIS — M25569 Pain in unspecified knee: Secondary | ICD-10-CM

## 2012-12-25 MED ORDER — OXYCODONE-ACETAMINOPHEN 7.5-325 MG PO TABS: 1.0000 | ORAL_TABLET | ORAL | Status: DC | PRN

## 2012-12-25 MED ORDER — NABUMETONE 500 MG PO TABS: 500.0000 mg | ORAL_TABLET | Freq: Two times a day (BID) | ORAL | Status: DC

## 2012-12-25 NOTE — Patient Instructions (Addendum)
stop ibuprofen start nabumetone   Continue percocet

## 2012-12-25 NOTE — Progress Notes (Signed)
Patient ID: Karen Mercer, female   DOB: July 13, 1970, 42 y.o.   MRN: [id number]  Chief Complaint  Patient presents with  . Follow-up     1 month  follow up Left knee DOS 08/28/12    Status post patellar realignment surgery left knee and surgical realignment right knee. Left knee most recent with proximal and distal realignment procedures. She continues to complain of aching knee pain she's on Percocet and ibuprofen. She's working having a lot of difficulty with that but she says she has to work  No catching locking giving way or dislocating episode she is aching knee pain from the quadriceps to the tibial tubercle  Her knee actually looks good today she is walking with a brace on her left leg her mood is good she is oriented x3 her appearance is normal she has full range of motion of both knees both patella are stable scans intact there is no warmth or effusion  Recommend change anti-inflammatory to calm back in a month continue Percocet switched to nabumetone 500 twice a day  Diagnosis Encounter Diagnoses  Name Primary?  . Knee pain, unspecified laterality Yes  . Status post knee surgery   . Hemarthrosis, site unspecified   . Effusion of knee joint, left   . Patellar instability, left

## 2013-01-27 ENCOUNTER — Ambulatory Visit (INDEPENDENT_AMBULATORY_CARE_PROVIDER_SITE_OTHER): Payer: BC Managed Care – PPO | Admitting: Orthopedic Surgery

## 2013-01-27 ENCOUNTER — Encounter: Payer: Self-pay | Admitting: Orthopedic Surgery

## 2013-01-27 VITALS — BP 155/104 | Ht 62.0 in | Wt 140.0 lb

## 2013-01-27 DIAGNOSIS — M2201 Recurrent dislocation of patella, right knee: Secondary | ICD-10-CM

## 2013-01-27 DIAGNOSIS — M2500 Hemarthrosis, unspecified joint: Secondary | ICD-10-CM

## 2013-01-27 DIAGNOSIS — Z9889 Other specified postprocedural states: Secondary | ICD-10-CM

## 2013-01-27 DIAGNOSIS — M24469 Recurrent dislocation, unspecified knee: Secondary | ICD-10-CM

## 2013-01-27 DIAGNOSIS — M25469 Effusion, unspecified knee: Secondary | ICD-10-CM

## 2013-01-27 DIAGNOSIS — M25462 Effusion, left knee: Secondary | ICD-10-CM

## 2013-01-27 MED ORDER — OXYCODONE-ACETAMINOPHEN 7.5-325 MG PO TABS: 1.0000 | ORAL_TABLET | ORAL | Status: DC | PRN

## 2013-01-27 NOTE — Progress Notes (Signed)
Patient ID: Karen Mercer, female   DOB: 1971-01-10, 42 y.o.   MRN: [id number]  Chief Complaint  Patient presents with  . Follow-up    1 month recheck bilateral knees and post op left knee   BP 155/104  Ht 5\' 2"  (1.575 m)  Wt 140 lb (63.504 kg)  BMI 25.6 kg/m2  Encounter Diagnoses  Name Primary?  . Recurrent dislocation of right patella Yes  . Status post knee surgery   . Hemarthrosis, site unspecified   . Effusion of knee joint, left     Routine monthly followup status post bilateral proximal and distal patellofemoral realignment  Complains of instability left patellofemoral joint  Pain at both hardware sites  Vital signs are stable she is awake and alert she is somewhat dismayed by the persistent pain and persistent instability of left knee which apparently comes at random moments. She does have ligament laxity there is concern that she is stretched out again. On exam she does have a hypermobile left patella multiple scars both knees nontender tender over the hardware at the tibial tubercle no joint effusion in either knee anterior cruciate ligament PCL is are stable neurovascular exam is intact  Work on strengthening the quad muscles at this point continue bracing and medication as needed. I would also add that she now has chronic pain in the chronic pain management which we can do and see her monthly

## 2013-01-27 NOTE — Patient Instructions (Signed)
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

## 2013-02-26 ENCOUNTER — Ambulatory Visit (INDEPENDENT_AMBULATORY_CARE_PROVIDER_SITE_OTHER): Payer: BC Managed Care – PPO | Admitting: Orthopedic Surgery

## 2013-02-26 VITALS — BP 150/98 | Ht 62.0 in | Wt 140.0 lb

## 2013-02-26 DIAGNOSIS — Z9889 Other specified postprocedural states: Secondary | ICD-10-CM

## 2013-02-26 DIAGNOSIS — M2500 Hemarthrosis, unspecified joint: Secondary | ICD-10-CM

## 2013-02-26 DIAGNOSIS — M25462 Effusion, left knee: Secondary | ICD-10-CM

## 2013-02-26 DIAGNOSIS — M25469 Effusion, unspecified knee: Secondary | ICD-10-CM

## 2013-02-26 MED ORDER — OXYCODONE-ACETAMINOPHEN 7.5-325 MG PO TABS: 1.0000 | ORAL_TABLET | ORAL | Status: DC | PRN

## 2013-02-26 NOTE — Progress Notes (Signed)
Patient ID: Karen Mercer, female   DOB: [birth year], 42 y.o.   MRN: [id number] Followup visit mainly for medication refill chronic pain status post bilateral patellofemoral realignment  Today patient complaining of back pain numbness and tingling left leg. I pretty much ask her this every time but on this occasion she does say that her back is hurting and her pain is associated with numbness and tingling in the posterior part of her left knee  Has a positive straight leg raise on that side. She has no weakness at this point pulses are intact knees are stable cruciate as well as patellofemoral joint  Recommend dose pack. Refill medication. Followup in one month refill medicines at that time

## 2013-03-31 ENCOUNTER — Ambulatory Visit (INDEPENDENT_AMBULATORY_CARE_PROVIDER_SITE_OTHER): Payer: BC Managed Care – PPO

## 2013-03-31 ENCOUNTER — Ambulatory Visit (INDEPENDENT_AMBULATORY_CARE_PROVIDER_SITE_OTHER): Payer: BC Managed Care – PPO | Admitting: Orthopedic Surgery

## 2013-03-31 VITALS — BP 136/92 | Ht 62.0 in | Wt 140.0 lb

## 2013-03-31 DIAGNOSIS — M79604 Pain in right leg: Secondary | ICD-10-CM

## 2013-03-31 DIAGNOSIS — M25462 Effusion, left knee: Secondary | ICD-10-CM

## 2013-03-31 DIAGNOSIS — G8929 Other chronic pain: Secondary | ICD-10-CM

## 2013-03-31 DIAGNOSIS — IMO0002 Reserved for concepts with insufficient information to code with codable children: Secondary | ICD-10-CM

## 2013-03-31 DIAGNOSIS — M79609 Pain in unspecified limb: Secondary | ICD-10-CM

## 2013-03-31 DIAGNOSIS — M4310 Spondylolisthesis, site unspecified: Secondary | ICD-10-CM | POA: Insufficient documentation

## 2013-03-31 DIAGNOSIS — Z9889 Other specified postprocedural states: Secondary | ICD-10-CM

## 2013-03-31 DIAGNOSIS — M2500 Hemarthrosis, unspecified joint: Secondary | ICD-10-CM

## 2013-03-31 DIAGNOSIS — M899 Disorder of bone, unspecified: Secondary | ICD-10-CM

## 2013-03-31 DIAGNOSIS — M5410 Radiculopathy, site unspecified: Secondary | ICD-10-CM | POA: Insufficient documentation

## 2013-03-31 DIAGNOSIS — M25469 Effusion, unspecified knee: Secondary | ICD-10-CM

## 2013-03-31 MED ORDER — OXYCODONE-ACETAMINOPHEN 7.5-325 MG PO TABS: 1.0000 | ORAL_TABLET | ORAL | Status: DC | PRN

## 2013-03-31 MED ORDER — GABAPENTIN 100 MG PO CAPS: 100.0000 mg | ORAL_CAPSULE | Freq: Three times a day (TID) | ORAL | Status: DC

## 2013-03-31 NOTE — Patient Instructions (Addendum)
Call hospital to arrange physical therapy; according to your schedule   Diagnosis retrolisthesis lumbar spine (shift of vertebrae) Lumbar root impingement (pinched nerve)  Recommend 6 weeks of physical therapy  Start gabapentin 100 mg 3 times a day if you become sleepy during the day; take 300 mg at night   Lumbosacral Radiculopathy Lumbosacral radiculopathy is a pinched nerve or nerves in the low back (lumbosacral area). When this happens you may have weakness in your legs and may not be able to stand on your toes. You may have pain going down into your legs. There may be difficulties with walking normally. There are many causes of this problem. Sometimes this may happen from an injury, or simply from arthritis or boney problems. It may also be caused by other illnesses such as diabetes. If there is no improvement after treatment, further studies may be done to find the exact cause. DIAGNOSIS  X-rays may be needed if the problems become long standing. Electromyograms may be done. This study is one in which the working of nerves and muscles is studied. HOME CARE INSTRUCTIONS   Applications of ice packs may be helpful. Ice can be used in a plastic bag with a towel around it to prevent frostbite to skin. This may be used every 2 hours for 20 to 30 minutes, or as needed, while awake, or as directed by your caregiver.  Only take over-the-counter or prescription medicines for pain, discomfort, or fever as directed by your caregiver.  If physical therapy was prescribed, follow your caregiver's directions. SEEK IMMEDIATE MEDICAL CARE IF:   You have pain not controlled with medications.  You seem to be getting worse rather than better.  You develop increasing weakness in your legs.  You develop loss of bowel or bladder control.  You have difficulty with walking or balance, or develop clumsiness in the use of your legs.  You have a fever. MAKE SURE YOU:   Understand these  instructions.  Will watch your condition.  Will get help right away if you are not doing well or get worse. Document Released: 04/23/2005 Document Revised: 07/16/2011 Document Reviewed: 12/12/2007 [facility] Patient Information 2014 [place], [place].

## 2013-04-01 ENCOUNTER — Encounter: Payer: Self-pay | Admitting: Orthopedic Surgery

## 2013-04-01 NOTE — Progress Notes (Signed)
Patient ID: DUAA STELZNER, female   DOB: Jun 11, 1970, 42 y.o.   MRN: [id number] Chief Complaint  Patient presents with  . Follow-up    one month recheck bilateral knees    BP 136/92  Ht 5\' 2"  (1.575 m)  Wt 140 lb (63.504 kg)  BMI 25.60 kg/m2  LMP 03/24/2013 Encounter Diagnoses  Name Primary?  . Bilateral leg pain Yes  . Radicular pain of left lower extremity   . Retrolisthesis   . Status post knee surgery   . Hemarthrosis, site unspecified   . Effusion of knee joint, left   . Chronic pain      The patient presents back with continued leg pains or chief complaint really should be leg pain left leg with occasional pain in the right leg with posterior knee pain and thigh pain unrelieved by previous medication including Percocet with only minor relief there. She also had a Dosepak. She had to leave work because of her pain which is associated with numbness and tingling  Examination vital signs are stable she is oriented x3 appearance is normal she's not overweight her mood is pleasant she angulates without a limp she has tenderness in the lower back and left gluteal area with normal range of motion in both hips. Both knees are stable including patella motor exam is normal scans intact good pulses are noted there is no sensory loss at this point in either limb  X-rays of lumbar spine show retrolisthesis of L5 on S1.  Recommend gabapentin 100 mg 3 ID continue Percocet 7.5 for her chronic pain used to gabapentin for the neuropathic pain. One month she should come back if not improved recommend MRI to prepare for epidural steroid injections  I also recommend physical therapy. She may be a candidate for chronic pain management.

## 2013-04-21 ENCOUNTER — Ambulatory Visit (HOSPITAL_COMMUNITY)
Admission: RE | Admit: 2013-04-21 | Discharge: 2013-04-21 | Disposition: A | Discharge status: Home / Self Care | Payer: BC Managed Care – PPO | Source: Ambulatory Visit | Attending: Internal Medicine | Admitting: Internal Medicine

## 2013-04-21 DIAGNOSIS — IMO0001 Reserved for inherently not codable concepts without codable children: Secondary | ICD-10-CM | POA: Insufficient documentation

## 2013-04-21 DIAGNOSIS — R29898 Other symptoms and signs involving the musculoskeletal system: Secondary | ICD-10-CM | POA: Insufficient documentation

## 2013-04-21 DIAGNOSIS — R262 Difficulty in walking, not elsewhere classified: Secondary | ICD-10-CM | POA: Insufficient documentation

## 2013-04-21 DIAGNOSIS — M79609 Pain in unspecified limb: Secondary | ICD-10-CM | POA: Insufficient documentation

## 2013-04-21 DIAGNOSIS — M545 Low back pain, unspecified: Secondary | ICD-10-CM | POA: Insufficient documentation

## 2013-04-21 NOTE — Evaluation (Signed)
Physical Therapy Evaluation  Patient Details  Name: Karen Mercer MRN: [id number] Date of Birth: [birth year]  Today's Date: 04/21/2013 Time: 1025-1105 PT Time Calculation (min): 40 min Charge:  Evaluation              Visit#: 1 of 18  Re-eval: 05/21/13 Assessment Diagnosis: retrolithesis Next MD Visit: 05/08/2014 Prior Therapy: none  Authorization: BCBS     Past Medical History: No past medical history on file. Past Surgical History:  Past Surgical History  Procedure Laterality Date  . Right arm      rods from fractured arm  . Tubal ligation    . Knee surgery  2012    right knee-arthroscopy  . Knee arthroscopy  08/17/2011    Procedure: ARTHROSCOPY KNEE;  Surgeon: [name], MD;  Location: [facility];  Service: Orthopedics;  Laterality: Right;  with tibia tubercle transfer and proximal realignment  . Knee arthroscopy with fulkerson slide Left 08/29/2012    Procedure: Arthroscopic lateral release with chondroplasty left knee;  Surgeon: [name], MD;  Location: [facility];  Service: Orthopedics;  Laterality: Left;  . Knee arthroscopy with medial patellar femoral ligament reconstruction Left 08/29/2012    Procedure: Open proximal realignment fulkerson;  Surgeon: [name], MD;  Location: [facility];  Service: Orthopedics;  Laterality: Left;    Subjective Symptoms/Limitations Symptoms: Ms. Vetrano states that she has been experiencing Low back pain that radiates down into her Lt leg for quit a while.  The patient states that her pain radiates to mid calf level. How long can you sit comfortably?: Pt is able to sit with comfort for 5-10 minutes How long can you stand comfortably?: Pt states she can stand for about 10 minute but she is shifting her weight throughout that time. How long can you walk comfortably?: Pt is able to walk 5-10 minutes. Special Tests: Pt states she is waking up 4-5 times at night.  Pain Assessment Currently in Pain?: Yes Pain Score: 8  (goes as  high as a 15.) Pain Location: Back Pain Orientation: Left;Right Pain Radiating Towards: Lt calf Pain Onset: More than a month ago Pain Frequency: Constant Pain Relieving Factors: lie down; take medication Effect of Pain on Daily Activities: increases  Cognition/Observation Observation/Other Assessments Observations: positive verbalization and facial grimace.  Sensation/Coordination/Flexibility/Functional Tests Functional Tests Functional Tests: foto FS 45 with adjusted 50   Assessment RLE Strength Right Hip Flexion: 3/5 Right Hip Extension: 3/5 Right Hip ABduction: 3+/5 Right Hip ADduction: 3/5 Right Knee Flexion: 3+/5 Right Knee Extension: 3/5 Right Ankle Dorsiflexion: 5/5 LLE Strength Left Hip Flexion: 3/5 Left Hip Extension: 3/5 Left Hip ABduction: 3/5 Left Hip ADduction: 3/5 Left Knee Flexion: 3+/5 Left Knee Extension:  (4-/5) Left Ankle Dorsiflexion:  (4-/5) Lumbar AROM Lumbar Flexion: wnl with increased pain upon return ; no change going down Lumbar Extension: decreased 20% with increased pain with reps Lumbar - Right Side Bend: wnl with reps causing increased leg pain Lumbar - Left Side Bend: wnl with no change Lumbar - Right Rotation: decreased 10% Lumbar - Left Rotation: decreased 205  Exercise/Treatments Mobility/Balance  Posture/Postural Control Posture/Postural Control: Postural limitations Postural Limitations: lateral Rt shift of pelvis; Lt iliac crest and PSIS low with ASIS high      Supine Ab Set: 5 reps Clam: 5 reps Bent Knee Raise: 5 reps  Physical Therapy Assessment and Plan PT Assessment and Plan Clinical Impression Statement: Pt to department with diagnosis of low back pain with radiculopathy to Lt  LE secondary to retrolithesis.  PT demonstrates a Rt lateral shift, improper alighnment of spine,decreased LE strength of all mm of B LE, and increased pain.  Pt will benefit from skilled PT to address these limitations to improve the pt  functional activity level and improve her quality of life.  Pt will benefit from skilled therapeutic intervention in order to improve on the following deficits: Pain;Decreased activity tolerance;Decreased range of motion;Decreased strength;Difficulty walking;Increased muscle spasms;Improper spinal/pelvic alignment Rehab Potential: Good PT Frequency: Min 3X/week PT Duration: 6 weeks PT Treatment/Interventions: Therapeutic activities;Therapeutic exercise;Manual techniques;Patient/family education;Modalities PT Plan: continue with manual techniques to address improper spinal alignment; continue with stabilization program.    Goals Home Exercise Program Pt/caregiver will Perform Home Exercise Program: For increased strengthening PT Goal: Perform Home Exercise Program - Progress: Goal set today PT Short Term Goals Time to Complete Short Term Goals: 2 weeks PT Short Term Goal 1: Pt pain to be no greater than a 7/10 80% of the day PT Short Term Goal 2: Pt to be able to sit with comfort for 30 minutes to enjoy a meal PT Short Term Goal 3: Pt to be able to stand for 20 minutes to socialize PT Short Term Goal 4: Pt to be able to walk for 20 minutes without pain for improved health habits PT Short Term Goal 5: Pt to be waking up 2 times or less a night PT Long Term Goals Time to Complete Long Term Goals:  (6 weeks) PT Long Term Goal 1: Pain no greater than a 3/10 80% of the day PT Long Term Goal 2: Pt to be able to sit for an hour and a half to watch a movie or travel without increased pain. Long Term Goal 3: Pt to be able to stand x 40 minutes to be able to complete work tasks with minimal pain Long Term Goal 4: Pt to be able to walk for 40 minutes in order to shop/ walk for healthy lifestyle. PT Long Term Goal 5: Pt to be sleeping throughout the night. Long Term Goal 5 Progress: Progressing toward goal  Problem List Patient Active Problem List   Diagnosis Date Noted  . Leg weakness, bilateral  04/21/2013  . Radicular pain of left lower extremity 03/31/2013  . Retrolisthesis 03/31/2013  . Effusion of knee joint 10/21/2012  . Status post knee surgery 10/21/2012  . 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  . Patellofemoral instability with pain 05/22/2012  . Dislocation of patella 01/16/2012  . Knee pain, bilateral 01/16/2012  . Difficulty in walking 10/31/2011  . Pain in right knee 10/31/2011  . Weakness of right leg 10/31/2011  . Knee pain 09/11/2011  . Stiffness of joint, not elsewhere classified, lower leg 09/11/2011  . Gait abnormality 09/11/2011  . Muscle weakness (generalized) 09/11/2011  . Knee instability 07/12/2011  . 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 Patellar instability 09/14/2010  . HEMARTHROSIS 04/04/2010  . SUBLUXATION PATELLAR (MALALIGNMENT) 05/04/2009  . 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   GP    RUSSELL,CINDY 04/21/2013, 11:40 AM  Physician Documentation Your signature is required to indicate approval of the treatment plan as stated above.  Please sign and either send electronically or make a copy of this report for your files  and return this physician signed original.   Please mark one 1.__approve of plan  2. ___approve of plan with the following conditions.   ______________________________                                                          _____________________ Physician Signature                                                                                                             Date

## 2013-04-23 ENCOUNTER — Ambulatory Visit (HOSPITAL_COMMUNITY)
Admission: RE | Admit: 2013-04-23 | Discharge: 2013-04-23 | Disposition: A | Discharge status: Home / Self Care | Payer: BC Managed Care – PPO | Source: Ambulatory Visit | Attending: Internal Medicine | Admitting: Internal Medicine

## 2013-04-23 DIAGNOSIS — R29898 Other symptoms and signs involving the musculoskeletal system: Secondary | ICD-10-CM

## 2013-04-23 NOTE — Progress Notes (Signed)
Physical Therapy Treatment Patient Details  Name: Karen Mercer MRN: [id number] Date of Birth: 1970-06-29  Today's Date: 04/23/2013 Time: 0852-0940 PT Time Calculation (min): 48 min  Visit#: 2 of 18  Re-eval: 05/21/13    Authorization: BCBS  Subjective: Symptoms/Limitations Symptoms: Ms Wente states she is doing her exercises Pain Assessment Currently in Pain?: Yes Pain Score: 3  Pain Location: Back Pain Orientation: Right;Left  Exercise/Treatments   Aerobic Tread Mill: 1.5 mph x 10:00 (do at end of treatment.) Supine Ab Set: 10 reps Clam: 10 reps Bent Knee Raise: 10 reps Straight Leg Raise: 5 reps (floating) Other Supine Lumbar Exercises: isometric ab/adduction Sidelying Hip Abduction: 10 reps  Manual Therapy Manual Therapy: Joint mobilization Joint Mobilization: grade II to L5 S1  Myofascial Release: MFR techniques completed to lumbaar paraspinal mm to decrease tension.  Physical Therapy Assessment and Plan PT Assessment and Plan Clinical Impression Statement: Pt completed therapist facilitated exercises needing verbal and manual cuing to keep proper stabilization.  PT has inceased mm tension along paraspinal mm from L3-L5 PT Plan: continue with manual techniques to address improper spinal alignment; continue with stabilization program.       Problem List Patient Active Problem List   Diagnosis Date Noted  . Leg weakness, bilateral 04/21/2013  . Radicular pain of left lower extremity 03/31/2013  . Retrolisthesis 03/31/2013  . Effusion of knee joint 10/21/2012  . Status post knee surgery 10/21/2012  . 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  . Patellofemoral instability with pain 05/22/2012  . Dislocation of patella 01/16/2012  . Knee pain, bilateral 01/16/2012  . Difficulty in walking 10/31/2011  . Pain in right knee 10/31/2011  . Weakness of right leg 10/31/2011   . Knee pain 09/11/2011  . Stiffness of joint, not elsewhere classified, lower leg 09/11/2011  . Gait abnormality 09/11/2011  . Muscle weakness (generalized) 09/11/2011  . Knee instability 07/12/2011  . 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 Patellar instability 09/14/2010  . HEMARTHROSIS 04/04/2010  . SUBLUXATION PATELLAR (MALALIGNMENT) 05/04/2009  . 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   GP    Sary Bogie,CINDY 04/23/2013, 1:57 PM

## 2013-04-28 ENCOUNTER — Ambulatory Visit (HOSPITAL_COMMUNITY): Payer: BC Managed Care – PPO

## 2013-05-04 ENCOUNTER — Inpatient Hospital Stay (HOSPITAL_COMMUNITY): Admission: RE | Admit: 2013-05-04 | Payer: BC Managed Care – PPO | Source: Ambulatory Visit

## 2013-05-06 ENCOUNTER — Ambulatory Visit (HOSPITAL_COMMUNITY): Payer: BC Managed Care – PPO | Admitting: *Deleted

## 2013-05-08 ENCOUNTER — Inpatient Hospital Stay (HOSPITAL_COMMUNITY): Admission: RE | Admit: 2013-05-08 | Payer: BC Managed Care – PPO | Source: Ambulatory Visit

## 2013-05-11 ENCOUNTER — Ambulatory Visit (HOSPITAL_COMMUNITY): Payer: BC Managed Care – PPO | Admitting: Physical Therapy

## 2013-05-13 ENCOUNTER — Telehealth (HOSPITAL_COMMUNITY): Payer: Self-pay | Admitting: Physical Therapy

## 2013-05-13 ENCOUNTER — Ambulatory Visit (HOSPITAL_COMMUNITY): Payer: BC Managed Care – PPO | Admitting: Physical Therapy

## 2013-05-14 ENCOUNTER — Ambulatory Visit (INDEPENDENT_AMBULATORY_CARE_PROVIDER_SITE_OTHER): Payer: BC Managed Care – PPO | Admitting: Orthopedic Surgery

## 2013-05-14 ENCOUNTER — Encounter: Payer: Self-pay | Admitting: Orthopedic Surgery

## 2013-05-14 VITALS — BP 151/101 | Ht 62.0 in | Wt 140.0 lb

## 2013-05-14 DIAGNOSIS — M5126 Other intervertebral disc displacement, lumbar region: Secondary | ICD-10-CM

## 2013-05-14 DIAGNOSIS — IMO0002 Reserved for concepts with insufficient information to code with codable children: Secondary | ICD-10-CM

## 2013-05-14 DIAGNOSIS — M5410 Radiculopathy, site unspecified: Secondary | ICD-10-CM

## 2013-05-14 DIAGNOSIS — G8929 Other chronic pain: Secondary | ICD-10-CM

## 2013-05-14 MED ORDER — OXYCODONE-ACETAMINOPHEN 7.5-325 MG PO TABS: 1.0000 | ORAL_TABLET | ORAL | Status: DC | PRN

## 2013-05-14 NOTE — Patient Instructions (Signed)
MRI ordered

## 2013-05-14 NOTE — Progress Notes (Signed)
Patient ID: Karen Mercer, female   DOB: Aug 30, 1970, 43 y.o.   MRN: [id number] Chief Complaint  Patient presents with  . Follow-up    6 week recheck L Spine and bilateral leg pain   Last office evaluation the following plan was made. X-rays of lumbar spine show retrolisthesis of L5 on S1.  Recommend gabapentin 100 mg 3 ID continue Percocet 7.5 for her chronic pain used to gabapentin for the neuropathic pain. One month she should come back if not improved recommend MRI to prepare for epidural steroid injections  I also recommend physical therapy. She may be a candidate for chronic pain management.  The patient reports that she is Still having left leg pain radiating in her left knee. The cold weather is bothered both knees. However her knees Stable. She Is No Joint Effusion in either knee.  Primary problem now is straight leg raise positive at 45 on the left with back pain and tenderness and pain radiating down the left leg  Recommend MRI  I think the patient is having neuropathic pain she indicated that she wants the legs to be cut off. She is on Percocet 7-1/2 mg as never been to get off of that so I think that she will be a candidate for pain management once I get her diagnosed properly  Return after MRI. I think she will do epidural steroids.

## 2013-05-15 ENCOUNTER — Ambulatory Visit (HOSPITAL_COMMUNITY): Payer: BC Managed Care – PPO | Admitting: Physical Therapy

## 2013-05-18 ENCOUNTER — Ambulatory Visit (HOSPITAL_COMMUNITY): Payer: BC Managed Care – PPO | Admitting: *Deleted

## 2013-05-19 ENCOUNTER — Ambulatory Visit (HOSPITAL_COMMUNITY)
Admission: RE | Admit: 2013-05-19 | Discharge: 2013-05-19 | Disposition: A | Discharge status: Home / Self Care | Payer: BC Managed Care – PPO | Source: Ambulatory Visit | Attending: Orthopedic Surgery | Admitting: Orthopedic Surgery

## 2013-05-19 DIAGNOSIS — M545 Low back pain, unspecified: Secondary | ICD-10-CM | POA: Insufficient documentation

## 2013-05-19 DIAGNOSIS — M79609 Pain in unspecified limb: Secondary | ICD-10-CM | POA: Insufficient documentation

## 2013-05-19 DIAGNOSIS — M5410 Radiculopathy, site unspecified: Secondary | ICD-10-CM

## 2013-05-19 DIAGNOSIS — M5126 Other intervertebral disc displacement, lumbar region: Secondary | ICD-10-CM | POA: Insufficient documentation

## 2013-05-20 ENCOUNTER — Ambulatory Visit (HOSPITAL_COMMUNITY): Payer: BC Managed Care – PPO | Admitting: *Deleted

## 2013-05-21 ENCOUNTER — Telehealth: Payer: Self-pay | Admitting: Orthopedic Surgery

## 2013-05-21 NOTE — Telephone Encounter (Signed)
Patient called for her MRI results, had done on 05/19/13 at [facility].  Her ph# is [phone] -- she does have an MRI follow up scheduled for 06/04/13.  Please advise.

## 2013-05-21 NOTE — Telephone Encounter (Signed)
Noted  

## 2013-05-21 NOTE — Telephone Encounter (Signed)
The MRI shows she does have a herniated disc and I would like to set her up for an epidural steroid series but I can discuss this further with her when I can call her

## 2013-05-22 ENCOUNTER — Ambulatory Visit (HOSPITAL_COMMUNITY): Payer: BC Managed Care – PPO | Admitting: Physical Therapy

## 2013-05-25 NOTE — Telephone Encounter (Signed)
Informed patient of DR. Harrison's reply.

## 2013-06-04 ENCOUNTER — Ambulatory Visit (INDEPENDENT_AMBULATORY_CARE_PROVIDER_SITE_OTHER): Payer: BC Managed Care – PPO | Admitting: Orthopedic Surgery

## 2013-06-04 VITALS — BP 151/95 | Ht 62.0 in | Wt 140.0 lb

## 2013-06-04 DIAGNOSIS — G8929 Other chronic pain: Secondary | ICD-10-CM

## 2013-06-04 DIAGNOSIS — IMO0002 Reserved for concepts with insufficient information to code with codable children: Secondary | ICD-10-CM

## 2013-06-04 MED ORDER — OXYCODONE-ACETAMINOPHEN 7.5-325 MG PO TABS: 1.0000 | ORAL_TABLET | ORAL | Status: DC | PRN

## 2013-06-04 NOTE — Patient Instructions (Signed)
Epidural Steroid Injection An epidural steroid injection is given to relieve pain in your neck, back, or legs that is caused by the irritation or swelling of a nerve root. This procedure involves injecting a steroid and numbing medicine (anesthetic) into the epidural space. The epidural space is the space between the outer covering of your spinal cord and the bones that form your backbone (vertebra).  LET [facility] CARE PROVIDER KNOW ABOUT:   Any allergies you have.  All medicines you are taking, including vitamins, herbs, eye drops, creams, and over-the-counter medicines such as aspirin.  Previous problems you or members of your family have had with the use of anesthetics.  Any blood disorders or blood clotting disorders you have.  Previous surgeries you have had.  Medical conditions you have. RISKS AND COMPLICATIONS Generally, this is a safe procedure. However, as with any procedure, complications can occur. Possible complications of epidural steroid injection include:  Headache.  Bleeding.  Infection.  Allergic reaction to the medicines.  Damage to your nerves. The response to this procedure depends on the underlying cause of the pain and its duration. People who have Amos Gaber-term (chronic) pain are less likely to benefit from epidural steroids than are those people whose pain comes on strong and suddenly. BEFORE THE PROCEDURE   Ask your health care provider about changing or stopping your regular medicines. You may be advised to stop taking blood-thinning medicines a few days before the procedure.  You may be given medicines to reduce anxiety.  Arrange for someone to take you home after the procedure. PROCEDURE   You will remain awake during the procedure. You may receive medicine to make you relaxed.  You will be asked to lie on your stomach.  The injection site will be cleaned.  The injection site will be numbed with a medicine (local anesthetic).  A needle will be  injected through your skin into the epidural space.  Your health care provider will use an X-ray machine to ensure that the steroid is delivered closest to the affected nerve. You may have minimal discomfort at this time.  Once the needle is in the right position, the local anesthetic and the steroid will be injected into the epidural space.  The needle will then be removed and a bandage will be applied to the injection site. AFTER THE PROCEDURE   You may be monitored for a short time before you go home.  You may feel weakness or numbness in your arm or leg, which disappears within hours.  You may be allowed to eat, drink, and take your regular medicine.  You may have soreness at the site of the injection. Document Released: 07/31/2007 Document Revised: 12/24/2012 Document Reviewed: 10/10/2012 [facility] Patient Information 2014 [place], [place].

## 2013-06-04 NOTE — Progress Notes (Signed)
Patient ID: Karen Mercer, female   DOB: 03-17-1971, 43 y.o.   MRN: [id number] Chief Complaint  Patient presents with  . Results    MRI Results L Spine    Rosalva presents for MRI followup  Bowel bladder function still intact  At L4-5, there is a mild circumferential disc bulge with superimposed moderate sized central and right subarticular disc extrusion with disc material extending superiorly in the right lateral recess. This results in mild right greater than left lateral recess narrowing and mild to moderate proximal right neural foraminal stenosis with likely mass effect on the exiting right L4 nerve.  I am sending her for epidural steroid injections should come back in a month we refilled her pain medication   recommend new FMLA papers regarding her back problem not the knee  Meds ordered this encounter  Medications  . oxyCODONE-acetaminophen (PERCOCET) 7.5-325 MG per tablet    Sig: Take 1 tablet by mouth every 4 (four) hours as needed for pain.    Dispense:  180 tablet    Refill:  0

## 2013-06-11 ENCOUNTER — Other Ambulatory Visit: Payer: BC Managed Care – PPO

## 2013-06-12 ENCOUNTER — Ambulatory Visit
Admission: RE | Admit: 2013-06-12 | Discharge: 2013-06-12 | Disposition: A | Discharge status: Home / Self Care | Payer: BC Managed Care – PPO | Source: Ambulatory Visit | Attending: Orthopedic Surgery | Admitting: Orthopedic Surgery

## 2013-06-12 VITALS — BP 159/100 | HR 57

## 2013-06-12 DIAGNOSIS — IMO0002 Reserved for concepts with insufficient information to code with codable children: Secondary | ICD-10-CM

## 2013-06-12 MED ORDER — IOHEXOL 180 MG/ML  SOLN
1.0000 mL | Freq: Once | INTRAMUSCULAR | Status: AC | PRN
  Administered 2013-06-12: 1 mL via EPIDURAL

## 2013-06-12 MED ORDER — METHYLPREDNISOLONE ACETATE 40 MG/ML INJ SUSP (RADIOLOG
120.0000 mg | Freq: Once | INTRAMUSCULAR | Status: AC
  Administered 2013-06-12: 120 mg via EPIDURAL

## 2013-06-12 NOTE — Discharge Instructions (Signed)

## 2013-07-07 ENCOUNTER — Ambulatory Visit: Payer: BC Managed Care – PPO | Admitting: Orthopedic Surgery

## 2013-07-07 ENCOUNTER — Encounter: Payer: Self-pay | Admitting: Orthopedic Surgery

## 2013-07-15 ENCOUNTER — Ambulatory Visit (INDEPENDENT_AMBULATORY_CARE_PROVIDER_SITE_OTHER): Payer: BC Managed Care – PPO | Admitting: Orthopedic Surgery

## 2013-07-15 VITALS — BP 142/93 | Ht 62.0 in | Wt 140.0 lb

## 2013-07-15 DIAGNOSIS — M48061 Spinal stenosis, lumbar region without neurogenic claudication: Secondary | ICD-10-CM

## 2013-07-15 DIAGNOSIS — M4310 Spondylolisthesis, site unspecified: Secondary | ICD-10-CM

## 2013-07-15 DIAGNOSIS — IMO0002 Reserved for concepts with insufficient information to code with codable children: Secondary | ICD-10-CM

## 2013-07-15 DIAGNOSIS — M899 Disorder of bone, unspecified: Secondary | ICD-10-CM

## 2013-07-15 DIAGNOSIS — G8929 Other chronic pain: Secondary | ICD-10-CM

## 2013-07-15 DIAGNOSIS — M949 Disorder of cartilage, unspecified: Secondary | ICD-10-CM

## 2013-07-15 DIAGNOSIS — M5410 Radiculopathy, site unspecified: Secondary | ICD-10-CM

## 2013-07-15 MED ORDER — IBUPROFEN 800 MG PO TABS: 800.0000 mg | ORAL_TABLET | Freq: Three times a day (TID) | ORAL | Status: DC | PRN

## 2013-07-15 MED ORDER — OXYCODONE-ACETAMINOPHEN 7.5-325 MG PO TABS: 1.0000 | ORAL_TABLET | ORAL | Status: DC | PRN

## 2013-07-15 MED ORDER — GABAPENTIN 100 MG PO CAPS: 100.0000 mg | ORAL_CAPSULE | Freq: Three times a day (TID) | ORAL | Status: DC

## 2013-07-15 NOTE — Progress Notes (Signed)
Patient ID: Karen Mercer, female   DOB: 1970-09-06, 43 y.o.   MRN: [id number]  Chief Complaint  Patient presents with  . Follow-up    1 month recheck knee and back s/p ESI   Followup visit status post one epidural with improvement pain seems to be recurring. Also complains of pain in the posterior aspect of the right knee  BP 142/93  Ht 5' 2"  (1.575 m)  Wt 140 lb (63.504 kg)  BMI 25.60 kg/m2 General appearance is normal, the patient is alert and oriented x3 with normal mood and affect. Both knees are stable at 20 flexion in terms of patellofemoral joint. She continues to have what to me her radicular symptoms and neurologic symptoms and I placed her on Neurontin ibuprofen and continue Percocet she should go for a second epidural injection

## 2013-07-15 NOTE — Patient Instructions (Signed)
Schedule second ESI

## 2013-07-20 ENCOUNTER — Encounter: Payer: Self-pay | Admitting: Orthopedic Surgery

## 2013-07-21 ENCOUNTER — Inpatient Hospital Stay: Admission: RE | Admit: 2013-07-21 | Payer: BC Managed Care – PPO | Source: Ambulatory Visit

## 2013-08-12 ENCOUNTER — Telehealth: Payer: Self-pay | Admitting: Orthopedic Surgery

## 2013-08-12 NOTE — Telephone Encounter (Signed)
It's ok that is her monthly follow up with Dr. Aline Brochure. Patient aware.

## 2013-08-12 NOTE — Telephone Encounter (Signed)
Patient has called to relay that she's had to re-schedule her second epidural steroid injection at [facility], for 08/18/13.  Her follow up appointment with Dr Aline Brochure is 08/20/13.  Is the return visit appointment too soon after injection?  Please advise - patient ph# [phone].

## 2013-08-18 ENCOUNTER — Ambulatory Visit
Admission: RE | Admit: 2013-08-18 | Discharge: 2013-08-18 | Disposition: A | Discharge status: Home / Self Care | Payer: BC Managed Care – PPO | Source: Ambulatory Visit | Attending: Orthopedic Surgery | Admitting: Orthopedic Surgery

## 2013-08-18 DIAGNOSIS — M48061 Spinal stenosis, lumbar region without neurogenic claudication: Secondary | ICD-10-CM

## 2013-08-18 MED ORDER — IOHEXOL 180 MG/ML  SOLN
1.0000 mL | Freq: Once | INTRAMUSCULAR | Status: AC | PRN
  Administered 2013-08-18: 1 mL via EPIDURAL

## 2013-08-18 MED ORDER — METHYLPREDNISOLONE ACETATE 40 MG/ML INJ SUSP (RADIOLOG
120.0000 mg | Freq: Once | INTRAMUSCULAR | Status: AC
Start: 2013-08-18 — End: 2013-08-18
  Administered 2013-08-18: 120 mg via EPIDURAL

## 2013-08-20 ENCOUNTER — Encounter: Payer: Self-pay | Admitting: Orthopedic Surgery

## 2013-08-20 ENCOUNTER — Ambulatory Visit (INDEPENDENT_AMBULATORY_CARE_PROVIDER_SITE_OTHER): Payer: BC Managed Care – PPO | Admitting: Orthopedic Surgery

## 2013-08-20 VITALS — BP 133/80 | Ht 62.0 in | Wt 140.0 lb

## 2013-08-20 DIAGNOSIS — IMO0002 Reserved for concepts with insufficient information to code with codable children: Secondary | ICD-10-CM

## 2013-08-20 DIAGNOSIS — M238X9 Other internal derangements of unspecified knee: Secondary | ICD-10-CM

## 2013-08-20 DIAGNOSIS — M25369 Other instability, unspecified knee: Secondary | ICD-10-CM

## 2013-08-20 DIAGNOSIS — M5410 Radiculopathy, site unspecified: Secondary | ICD-10-CM

## 2013-08-20 DIAGNOSIS — G8929 Other chronic pain: Secondary | ICD-10-CM

## 2013-08-20 DIAGNOSIS — M25569 Pain in unspecified knee: Secondary | ICD-10-CM

## 2013-08-20 MED ORDER — OXYCODONE-ACETAMINOPHEN 7.5-325 MG PO TABS: 1.0000 | ORAL_TABLET | ORAL | Status: DC | PRN

## 2013-08-20 NOTE — Patient Instructions (Signed)
No new instructions

## 2013-08-20 NOTE — Progress Notes (Signed)
Patient ID: Karen Mercer, female   DOB: Sep 18, 1970, 43 y.o.   MRN: [id number] Chief Complaint  Patient presents with  . Follow-up    follow up s/p second ESI    BP 133/80  Ht 5' 2"  (1.575 m)  Wt 140 lb (63.504 kg)  BMI 25.60 kg/m2  No swelling today. Pain with range and bad weather.  Exam vital signs are stable she is oriented x3 mood and affect normal no gait disturbance no swelling in the knee incisions healed no tenderness slight plaques in the patellofemoral joint collateral ligament stable at 0 and 30 anterior cruciate ligament PCL intact  Impression ligament laxity contributing to instability right knee. New brace applied. Continue medication follow up in 3 months

## 2013-09-16 ENCOUNTER — Telehealth: Payer: Self-pay | Admitting: Orthopedic Surgery

## 2013-09-17 ENCOUNTER — Encounter (HOSPITAL_COMMUNITY): Payer: Self-pay | Admitting: Emergency Medicine

## 2013-09-17 ENCOUNTER — Emergency Department (HOSPITAL_COMMUNITY)
Admission: EM | Admit: 2013-09-17 | Discharge: 2013-09-17 | Disposition: A | Discharge status: Home / Self Care | Payer: BC Managed Care – PPO | Attending: Emergency Medicine | Admitting: Emergency Medicine

## 2013-09-17 DIAGNOSIS — M545 Low back pain, unspecified: Secondary | ICD-10-CM | POA: Insufficient documentation

## 2013-09-17 DIAGNOSIS — Z791 Long term (current) use of non-steroidal anti-inflammatories (NSAID): Secondary | ICD-10-CM | POA: Insufficient documentation

## 2013-09-17 DIAGNOSIS — F17200 Nicotine dependence, unspecified, uncomplicated: Secondary | ICD-10-CM | POA: Insufficient documentation

## 2013-09-17 DIAGNOSIS — M5126 Other intervertebral disc displacement, lumbar region: Secondary | ICD-10-CM

## 2013-09-17 MED ORDER — OXYCODONE-ACETAMINOPHEN 5-325 MG PO TABS: 1.0000 | ORAL_TABLET | Freq: Four times a day (QID) | ORAL | Status: DC | PRN

## 2013-09-17 MED ORDER — HYDROCODONE-ACETAMINOPHEN 5-325 MG PO TABS
1.0000 | ORAL_TABLET | Freq: Once | ORAL | Status: AC
  Administered 2013-09-17: 1 via ORAL
  Filled 2013-09-17: qty 1

## 2013-09-17 NOTE — ED Provider Notes (Signed)
CSN: [id number]     Arrival date & time 09/17/13  1300 History  This chart was scribed for Mervin Kung, MD,  by Stacy Gardner, ED Scribe. The patient was seen in room APA11/APA11 and the patient's care was started at 1:45 PM.   First MD Initiated Contact with Patient 09/17/13 1311     Chief Complaint  Patient presents with  . Back Pain     (Consider location/radiation/quality/duration/timing/severity/associated sxs/prior Treatment) Patient is a 43 y.o. female presenting with back pain. The history is provided by the patient and medical records. No language interpreter was used.  Back Pain Associated symptoms: no chest pain, no dysuria, no fever, no headaches, no numbness and no weakness    HPI Comments: Karen Mercer is a 43 y.o. female who presents to the Emergency Department complaining of lower lumbar pain, onset four days ago. The pain is 10/10 in severity.  She had an MRI performed that revealed a bulging disc. She had two prior steroid shots at [facility]. With the initial shot experienced relief however the second shot which was 08/18/13 she did have any relief. Nothing seems to help. She does not have any allergies to medications.  Denies chills and fever. Denies chest pain, cough,  and leg swelling. Denies nausea, vomiting, and diarrhea. Denies neck pain. Denies dysuria. Denies rash.   Dr. Ples Specter is her Orthopedist.   Past Medical History  Diagnosis Date  . Disc    Past Surgical History  Procedure Laterality Date  . Right arm      rods from fractured arm  . Tubal ligation    . Knee surgery  2012    right knee-arthroscopy  . Knee arthroscopy  08/17/2011    Procedure: ARTHROSCOPY KNEE;  Surgeon: Carole Civil, MD;  Location: [facility];  Service: Orthopedics;  Laterality: Right;  with tibia tubercle transfer and proximal realignment  . Knee arthroscopy with fulkerson slide Left 08/29/2012    Procedure: Arthroscopic lateral release with chondroplasty  left knee;  Surgeon: Carole Civil, MD;  Location: [facility];  Service: Orthopedics;  Laterality: Left;  . Knee arthroscopy with medial patellar femoral ligament reconstruction Left 08/29/2012    Procedure: Open proximal realignment fulkerson;  Surgeon: Carole Civil, MD;  Location: [facility];  Service: Orthopedics;  Laterality: Left;   Family History  Problem Relation Age of Onset  . Arthritis      family history    History  Substance Use Topics  . Smoking status: Current Every Day Smoker -- 0.50 packs/day for 17 years    Types: Cigarettes  . Smokeless tobacco: Not on file  . Alcohol Use: Yes     Comment: OCCASIONALLY    OB History   Grav Para Term Preterm Abortions TAB SAB Ect Mult Living                 Review of Systems  Constitutional: Negative for fever and chills.  Respiratory: Negative for cough.   Cardiovascular: Negative for chest pain and leg swelling.  Gastrointestinal: Negative for nausea, vomiting and diarrhea.  Genitourinary: Negative for dysuria.  Musculoskeletal: Positive for back pain. Negative for neck pain.  Skin: Negative for rash.  Neurological: Negative for weakness, numbness and headaches.  Hematological: Does not bruise/bleed easily.  Psychiatric/Behavioral: Negative for confusion.  All other systems reviewed and are negative.     Allergies  Review of patient's allergies indicates no known allergies.  Home Medications   Prior to Admission medications  Medication Sig Start Date End Date Taking? Authorizing Provider  gabapentin (NEURONTIN) 100 MG capsule Take 1 capsule (100 mg total) by mouth 3 (three) times daily. 07/15/13   Carole Civil, MD  ibuprofen (ADVIL,MOTRIN) 800 MG tablet Take 1 tablet (800 mg total) by mouth every 8 (eight) hours as needed. 07/15/13   Carole Civil, MD  nabumetone (RELAFEN) 500 MG tablet Take 1 tablet (500 mg total) by mouth 2 (two) times daily. 12/25/12   Carole Civil, MD  oxyCODONE-acetaminophen  (PERCOCET) 7.5-325 MG per tablet Take 1 tablet by mouth every 4 (four) hours as needed for pain. 08/20/13   Carole Civil, MD   BP 144/104  Pulse 68  Temp(Src) 97.8 F (36.6 C) (Oral)  Resp 18  Ht 5' 2"  (1.575 m)  Wt 146 lb (66.225 kg)  BMI 26.70 kg/m2  SpO2 100%  LMP 09/15/2013 Physical Exam  Nursing note and vitals reviewed. Constitutional: She is oriented to person, place, and time. She appears well-developed and well-nourished.  HENT:  Head: Normocephalic.  Eyes: EOM are normal. Pupils are equal, round, and reactive to light.  Neck: Normal range of motion.  Cardiovascular: Normal rate, regular rhythm and normal heart sounds.  Exam reveals no gallop and no friction rub.   No murmur heard. Pulmonary/Chest: Effort normal and breath sounds normal. No respiratory distress. She has no wheezes. She has no rales.  Abdominal: Soft. Bowel sounds are normal. There is no tenderness. There is no rebound and no guarding.  Musculoskeletal: She exhibits tenderness. She exhibits no edema.  Paraspinal tenderness  Bilateral lumbar tenderness.   Neurological: She is alert and oriented to person, place, and time. No cranial nerve deficit. She exhibits normal muscle tone. Coordination normal.  Skin: Skin is warm and dry. She is not diaphoretic. No erythema.  Psychiatric: She has a normal mood and affect. Her behavior is normal.  No focal deficit neurologically.     ED Course  Procedures (including critical care time) DIAGNOSTIC STUDIES: Oxygen Saturation is 100% on room air, normal  by my interpretation.    COORDINATION OF CARE:  1:45 PM Discussed course of care with pt . Pt understands and agrees.    Labs Review Labs Reviewed - No data to display  Imaging Review No results found.   EKG Interpretation None      MDM   Final diagnoses:  HNP (herniated nucleus pulposus), lumbar  Lumbar pain    Patient followed by Dr. Aline Brochure from orthopedics. Patient known to have  herniated disc in the lumbar area. Had injections there a month ago done by [facility] imaging. Patient with exacerbation of pain starting earlier this week. No focal neuro deficits. Will treat with pain control work note to rest and follow back up with orthopedics. No new or acute injury.    I personally performed the services described in this documentation, which was scribed in my presence. The recorded information has been reviewed and is accurate.      Mervin Kung, MD 09/17/13 1346

## 2013-09-17 NOTE — Discharge Instructions (Signed)
Take pain medicine as directed. Followup with the orthopedic doctor in the next few days. Rest work note provided. Return for any new or worse symptoms.

## 2013-09-17 NOTE — Telephone Encounter (Signed)
Noted  

## 2013-09-21 ENCOUNTER — Ambulatory Visit: Payer: BC Managed Care – PPO | Admitting: Orthopedic Surgery

## 2013-09-21 ENCOUNTER — Encounter: Payer: Self-pay | Admitting: Orthopedic Surgery

## 2013-10-07 ENCOUNTER — Telehealth: Payer: Self-pay | Admitting: Orthopedic Surgery

## 2013-10-07 NOTE — Telephone Encounter (Signed)
Routing to Dr Harrison 

## 2013-10-08 ENCOUNTER — Other Ambulatory Visit: Payer: Self-pay | Admitting: *Deleted

## 2013-10-08 DIAGNOSIS — G8929 Other chronic pain: Secondary | ICD-10-CM

## 2013-10-08 MED ORDER — OXYCODONE-ACETAMINOPHEN 7.5-325 MG PO TABS
1.0000 | ORAL_TABLET | ORAL | Status: DC | PRN
Start: 2013-10-08 — End: 2013-11-24

## 2013-10-08 NOTE — Telephone Encounter (Signed)
Refilled and patient picked up 10/08/13

## 2013-10-08 NOTE — Telephone Encounter (Signed)
refill 

## 2013-11-16 ENCOUNTER — Other Ambulatory Visit: Payer: Self-pay | Admitting: Orthopedic Surgery

## 2013-11-16 DIAGNOSIS — M79604 Pain in right leg: Secondary | ICD-10-CM

## 2013-11-16 DIAGNOSIS — M47817 Spondylosis without myelopathy or radiculopathy, lumbosacral region: Secondary | ICD-10-CM

## 2013-11-18 ENCOUNTER — Ambulatory Visit
Admission: RE | Admit: 2013-11-18 | Discharge: 2013-11-18 | Disposition: A | Discharge status: Home / Self Care | Payer: BC Managed Care – PPO | Source: Ambulatory Visit | Attending: Orthopedic Surgery | Admitting: Orthopedic Surgery

## 2013-11-18 DIAGNOSIS — M47817 Spondylosis without myelopathy or radiculopathy, lumbosacral region: Secondary | ICD-10-CM

## 2013-11-18 DIAGNOSIS — M79604 Pain in right leg: Secondary | ICD-10-CM

## 2013-11-18 MED ORDER — METHYLPREDNISOLONE ACETATE 40 MG/ML INJ SUSP (RADIOLOG
120.0000 mg | Freq: Once | INTRAMUSCULAR | Status: AC
  Administered 2013-11-18: 120 mg via EPIDURAL

## 2013-11-18 MED ORDER — IOHEXOL 180 MG/ML  SOLN
1.0000 mL | Freq: Once | INTRAMUSCULAR | Status: AC | PRN
  Administered 2013-11-18: 1 mL via EPIDURAL

## 2013-11-24 ENCOUNTER — Encounter: Payer: Self-pay | Admitting: Orthopedic Surgery

## 2013-11-24 ENCOUNTER — Ambulatory Visit (INDEPENDENT_AMBULATORY_CARE_PROVIDER_SITE_OTHER): Payer: BC Managed Care – PPO | Admitting: Orthopedic Surgery

## 2013-11-24 VITALS — BP 155/105 | Ht 62.0 in | Wt 146.0 lb

## 2013-11-24 DIAGNOSIS — M899 Disorder of bone, unspecified: Secondary | ICD-10-CM

## 2013-11-24 DIAGNOSIS — M25369 Other instability, unspecified knee: Secondary | ICD-10-CM

## 2013-11-24 DIAGNOSIS — M25569 Pain in unspecified knee: Secondary | ICD-10-CM

## 2013-11-24 DIAGNOSIS — M949 Disorder of cartilage, unspecified: Secondary | ICD-10-CM

## 2013-11-24 DIAGNOSIS — M238X9 Other internal derangements of unspecified knee: Secondary | ICD-10-CM

## 2013-11-24 DIAGNOSIS — M4310 Spondylolisthesis, site unspecified: Secondary | ICD-10-CM

## 2013-11-24 DIAGNOSIS — IMO0002 Reserved for concepts with insufficient information to code with codable children: Secondary | ICD-10-CM

## 2013-11-24 DIAGNOSIS — G8929 Other chronic pain: Secondary | ICD-10-CM

## 2013-11-24 DIAGNOSIS — M5410 Radiculopathy, site unspecified: Secondary | ICD-10-CM

## 2013-11-24 MED ORDER — GABAPENTIN 100 MG PO CAPS: 100.0000 mg | ORAL_CAPSULE | Freq: Three times a day (TID) | ORAL | Status: DC

## 2013-11-24 MED ORDER — OXYCODONE-ACETAMINOPHEN 7.5-325 MG PO TABS: 1.0000 | ORAL_TABLET | ORAL | Status: DC | PRN

## 2013-11-24 NOTE — Patient Instructions (Signed)
Ice and ibuprofen for knee pain and swelling

## 2013-11-24 NOTE — Progress Notes (Signed)
Patient ID: Karen Mercer, female   DOB: November 15, 1970, 43 y.o.   MRN: [id number] Chief Complaint  Patient presents with  . Follow-up    3 month recheck right knee/med refill    Encounter Diagnoses  Name Primary?  . Chronic pain Yes  . Radicular pain of left lower extremity   . Patellofemoral instability with pain, unspecified laterality     Current outpatient prescriptions:gabapentin (NEURONTIN) 100 MG capsule, Take 1 capsule (100 mg total) by mouth 3 (three) times daily., Disp: 90 capsule, Rfl: 2;  oxyCODONE-acetaminophen (PERCOCET) 7.5-325 MG per tablet, Take 1 tablet by mouth every 4 (four) hours as needed for pain., Disp: 180 tablet, Rfl: 0    3 ESI injections for right radicular pain. She does get some relief but then the pain comes back. Not interested in back surgery. Bowel and bladder function remains intact. Primary symptom lower back pain posterior thigh pain radiating to the knee and sometimes into the calf and foot.  Continue current pain management. She does not want back surgery. Return in a month to refill her medications and check her pain response.

## 2013-12-29 ENCOUNTER — Ambulatory Visit: Payer: BC Managed Care – PPO | Admitting: Orthopedic Surgery

## 2013-12-29 ENCOUNTER — Encounter: Payer: Self-pay | Admitting: Orthopedic Surgery

## 2014-01-02 ENCOUNTER — Encounter (HOSPITAL_COMMUNITY): Payer: Self-pay | Admitting: Emergency Medicine

## 2014-01-02 ENCOUNTER — Emergency Department (HOSPITAL_COMMUNITY)
Admission: EM | Admit: 2014-01-02 | Discharge: 2014-01-02 | Disposition: A | Discharge status: Home / Self Care | Payer: BC Managed Care – PPO | Attending: Emergency Medicine | Admitting: Emergency Medicine

## 2014-01-02 DIAGNOSIS — F17200 Nicotine dependence, unspecified, uncomplicated: Secondary | ICD-10-CM | POA: Diagnosis not present

## 2014-01-02 DIAGNOSIS — M549 Dorsalgia, unspecified: Secondary | ICD-10-CM | POA: Insufficient documentation

## 2014-01-02 DIAGNOSIS — IMO0002 Reserved for concepts with insufficient information to code with codable children: Secondary | ICD-10-CM | POA: Insufficient documentation

## 2014-01-02 DIAGNOSIS — Z79899 Other long term (current) drug therapy: Secondary | ICD-10-CM | POA: Insufficient documentation

## 2014-01-02 DIAGNOSIS — M5416 Radiculopathy, lumbar region: Secondary | ICD-10-CM

## 2014-01-02 MED ORDER — METHYLPREDNISOLONE (PAK) 4 MG PO TABS: ORAL_TABLET | ORAL | Status: DC

## 2014-01-02 MED ORDER — IBUPROFEN 800 MG PO TABS: 800.0000 mg | ORAL_TABLET | Freq: Three times a day (TID) | ORAL | Status: DC

## 2014-01-02 MED ORDER — OXYCODONE-ACETAMINOPHEN 5-325 MG PO TABS
2.0000 | ORAL_TABLET | Freq: Once | ORAL | Status: AC
  Administered 2014-01-02: 2 via ORAL
  Filled 2014-01-02: qty 2

## 2014-01-02 MED ORDER — IBUPROFEN 800 MG PO TABS
800.0000 mg | ORAL_TABLET | Freq: Once | ORAL | Status: AC
  Administered 2014-01-02: 800 mg via ORAL
  Filled 2014-01-02: qty 1

## 2014-01-02 MED ORDER — OXYCODONE-ACETAMINOPHEN 5-325 MG PO TABS: 2.0000 | ORAL_TABLET | ORAL | Status: DC | PRN

## 2014-01-02 NOTE — ED Provider Notes (Signed)
CSN: [id number]     Arrival date & time 01/02/14  0840 History  This chart was scribed for Karen Essex, MD by Starleen Arms, ED Scribe. This patient was seen in room APA04/APA04 and the patient's care was started at 8:55 AM.   Chief Complaint  Patient presents with  . Back Pain    HPI HPI Comments: Karen Mercer is a 43 y.o. female who presents to the Emergency Department complaining of an acute exacerbation of of several months of right-sided back pain onset 3 days ago with radiation down her right leg and intermittent right leg weakness.  Patient reports that long periods of sitting can aggravate her back pain and cause throbbing in her leg.  Patient is seen by Dr. Aline Brochure for this pain and states she has a history of two herniated lumbar disks discovered via MRI this spring.  She reports she received steroid injections in the past and is scheduled to see Dr. Aline Brochure on 9/3.  Patient reports she typically takes ibuprofen and Percocet for pain reports she ran out of Percocet 3 days ago.  Patient denies fall, injury, nausea, vomiting, bowel bladder incontinence, vomiting, numbness/tingling, other medical problems, dysuria, hematuria.    Past Medical History  Diagnosis Date  . Disc    Past Surgical History  Procedure Laterality Date  . Right arm      rods from fractured arm  . Tubal ligation    . Knee surgery  2012    right knee-arthroscopy  . Knee arthroscopy  08/17/2011    Procedure: ARTHROSCOPY KNEE;  Surgeon: Carole Civil, MD;  Location: [facility];  Service: Orthopedics;  Laterality: Right;  with tibia tubercle transfer and proximal realignment  . Knee arthroscopy with fulkerson slide Left 08/29/2012    Procedure: Arthroscopic lateral release with chondroplasty left knee;  Surgeon: Carole Civil, MD;  Location: [facility];  Service: Orthopedics;  Laterality: Left;  . Knee arthroscopy with medial patellar femoral ligament reconstruction Left 08/29/2012    Procedure: Open  proximal realignment fulkerson;  Surgeon: Carole Civil, MD;  Location: [facility];  Service: Orthopedics;  Laterality: Left;   Family History  Problem Relation Age of Onset  . Arthritis      family history    History  Substance Use Topics  . Smoking status: Current Every Day Smoker -- 1.00 packs/day for 17 years    Types: Cigarettes  . Smokeless tobacco: Not on file  . Alcohol Use: Yes     Comment: OCCASIONALLY    OB History   Grav Para Term Preterm Abortions TAB SAB Ect Mult Living                 Review of Systems    Allergies  Tramadol  Home Medications   Prior to Admission medications   Medication Sig Start Date End Date Taking? Authorizing Provider  gabapentin (NEURONTIN) 100 MG capsule Take 1 capsule (100 mg total) by mouth 3 (three) times daily. 11/24/13   Carole Civil, MD  ibuprofen (ADVIL,MOTRIN) 800 MG tablet Take 800 mg by mouth every 8 (eight) hours as needed.    Historical Provider, MD  ibuprofen (ADVIL,MOTRIN) 800 MG tablet Take 1 tablet (800 mg total) by mouth 3 (three) times daily. 01/02/14   Karen Essex, MD  methylPREDNIsolone (MEDROL DOSPACK) 4 MG tablet follow package directions 01/02/14   Karen Essex, MD  oxyCODONE-acetaminophen (PERCOCET) 7.5-325 MG per tablet Take 1 tablet by mouth every 4 (four) hours as needed for  pain. 11/24/13   Carole Civil, MD  oxyCODONE-acetaminophen (PERCOCET/ROXICET) 5-325 MG per tablet Take 2 tablets by mouth every 4 (four) hours as needed for severe pain. 01/02/14   Karen Essex, MD   BP 152/82  Pulse 51  Temp(Src) 98.2 F (36.8 C) (Oral)  Resp 18  Ht 5' 2"  (1.575 m)  Wt 149 lb (67.586 kg)  BMI 27.25 kg/m2  SpO2 100%  LMP 01/02/2014 Physical Exam  Nursing note and vitals reviewed. Constitutional: She is oriented to person, place, and time. She appears well-developed and well-nourished. No distress.     HENT:  Head: Normocephalic and atraumatic.  Mouth/Throat: Oropharynx is clear and moist. No  oropharyngeal exudate.  Eyes: Conjunctivae and EOM are normal. Pupils are equal, round, and reactive to light.  Neck: Normal range of motion. Neck supple.  No meningismus.  Cardiovascular: Normal rate, regular rhythm, normal heart sounds and intact distal pulses.   No murmur heard. Pulmonary/Chest: Effort normal and breath sounds normal. No respiratory distress.  Abdominal: Soft. There is no tenderness. There is no rebound and no guarding.  Musculoskeletal: Normal range of motion. She exhibits no edema and no tenderness.  Right sided paraspinal lumbar tenderness.  5/5 strength in bilateral lower extremities. Ankle plantar and dorsiflexion intact. Great toe extension intact bilaterally. +2 DP and PT pulses. +2 patellar reflexes bilaterally.  Antalgic gait.   Neurological: She is alert and oriented to person, place, and time. No cranial nerve deficit. She exhibits normal muscle tone. Coordination normal.  No ataxia on finger to nose bilaterally. No pronator drift. 5/5 strength throughout. CN 2-12 intact. Negative Romberg. Equal grip strength. Sensation intact.   Skin: Skin is warm.  Psychiatric: She has a normal mood and affect. Her behavior is normal.    ED Course  Procedures (including critical care time)  DIAGNOSTIC STUDIES: Oxygen Saturation is 100% on RA, normal by my interpretation.    COORDINATION OF CARE:  9:02 AM Discussed treatment plan with patient at bedside.  Patient acknowledges and agrees with plan.    Labs Review Labs Reviewed - No data to display  Imaging Review No results found.   EKG Interpretation None      MDM   Final diagnoses:  Right lumbar radiculopathy   Acute on chronic low back pain for the past 4 days. No new injury. No fever or vomiting. No focal weakness, numbness or tingling. No bowel or bladder incontinence.  History of lumbar radiculopathy receiving injections by Dr. Aline Brochure. Last injection 1 month ago.  No change in chronic pain pattern.  No new trauma. Patient states she is out of her Percocet and Motrin. She sees Dr. Aline Brochure in 5 days.  No evidence of cauda equina or cord compression. Strength intact bilaterally. Will prescribe short course of pain medication and steroids. She is not a diabetic and states steroids have helped in past. Follow up with Dr. Aline Brochure this week. Return precautions discussed.   I personally performed the services described in this documentation, which was scribed in my presence. The recorded information has been reviewed and is accurate.    Karen Essex, MD 01/02/14 [phone]

## 2014-01-02 NOTE — ED Notes (Signed)
Pt discharged to home with family. States pain is 8/10. NAD.

## 2014-01-02 NOTE — ED Notes (Signed)
Pt reports chronic back pain flare-up since Wednesday. Pt denies any new/recent injury. nad noted.

## 2014-01-02 NOTE — Discharge Instructions (Signed)
Back Pain, Adult Follow up with Dr. Aline Brochure as scheduled. Return to the ED if you develop worsening pain, weakness, incontinence, fever, or any other concerns. Back pain is very common. The pain often gets better over time. The cause of back pain is usually not dangerous. Most people can learn to manage their back pain on their own.  HOME CARE   Stay active. Start with short walks on flat ground if you can. Try to walk farther each day.  Do not sit, drive, or stand in one place for more than 30 minutes. Do not stay in bed.  Do not avoid exercise or work. Activity can help your back heal faster.  Be careful when you bend or lift an object. Bend at your knees, keep the object close to you, and do not twist.  Sleep on a firm mattress. Lie on your side, and bend your knees. If you lie on your back, put a pillow under your knees.  Only take medicines as told by your doctor.  Put ice on the injured area.  Put ice in a plastic bag.  Place a towel between your skin and the bag.  Leave the ice on for 15-20 minutes, 03-04 times a day for the first 2 to 3 days. After that, you can switch between ice and heat packs.  Ask your doctor about back exercises or massage.  Avoid feeling anxious or stressed. Find good ways to deal with stress, such as exercise. GET HELP RIGHT AWAY IF:   Your pain does not go away with rest or medicine.  Your pain does not go away in 1 week.  You have new problems.  You do not feel well.  The pain spreads into your legs.  You cannot control when you poop (bowel movement) or pee (urinate).  Your arms or legs feel weak or lose feeling (numbness).  You feel sick to your stomach (nauseous) or throw up (vomit).  You have belly (abdominal) pain.  You feel like you may pass out (faint). MAKE SURE YOU:   Understand these instructions.  Will watch your condition.  Will get help right away if you are not doing well or get worse. Document Released:  10/10/2007 Document Revised: 07/16/2011 Document Reviewed: 08/25/2013 [facility] Patient Information 2015 [place], [place]. This information is not intended to replace advice given to you by your health care provider. Make sure you discuss any questions you have with your health care provider.

## 2014-01-02 NOTE — ED Notes (Signed)
Pt presents C/O pain to lower back radiating down right leg. States pain is chronic and is no different than previous experiences. States she is out of pain medication and is scheduled to go back to doctor on Thursday. States pain is 10/10. Pt is fidgety and moaning.

## 2014-01-07 ENCOUNTER — Encounter: Payer: Self-pay | Admitting: Orthopedic Surgery

## 2014-01-07 ENCOUNTER — Other Ambulatory Visit: Payer: Self-pay | Admitting: *Deleted

## 2014-01-07 ENCOUNTER — Ambulatory Visit (INDEPENDENT_AMBULATORY_CARE_PROVIDER_SITE_OTHER): Payer: BC Managed Care – PPO | Admitting: Orthopedic Surgery

## 2014-01-07 ENCOUNTER — Telehealth: Payer: Self-pay | Admitting: *Deleted

## 2014-01-07 VITALS — BP 154/103 | Ht 62.0 in | Wt 149.0 lb

## 2014-01-07 DIAGNOSIS — M25369 Other instability, unspecified knee: Secondary | ICD-10-CM

## 2014-01-07 DIAGNOSIS — M238X9 Other internal derangements of unspecified knee: Secondary | ICD-10-CM

## 2014-01-07 DIAGNOSIS — IMO0002 Reserved for concepts with insufficient information to code with codable children: Secondary | ICD-10-CM

## 2014-01-07 DIAGNOSIS — M5410 Radiculopathy, site unspecified: Secondary | ICD-10-CM

## 2014-01-07 DIAGNOSIS — M25569 Pain in unspecified knee: Secondary | ICD-10-CM

## 2014-01-07 DIAGNOSIS — M48061 Spinal stenosis, lumbar region without neurogenic claudication: Secondary | ICD-10-CM

## 2014-01-07 DIAGNOSIS — G8929 Other chronic pain: Secondary | ICD-10-CM

## 2014-01-07 MED ORDER — OXYCODONE-ACETAMINOPHEN 7.5-325 MG PO TABS: 1.0000 | ORAL_TABLET | ORAL | Status: DC | PRN

## 2014-01-07 MED ORDER — PREDNISONE (PAK) 10 MG PO TABS: ORAL_TABLET | ORAL | Status: DC

## 2014-01-07 NOTE — Telephone Encounter (Signed)
REFERRAL HAS BEEN SENT TO [facility]

## 2014-01-07 NOTE — Patient Instructions (Signed)
Will refer to neurosurgery  Out of work Thursday and Friday

## 2014-01-07 NOTE — Progress Notes (Signed)
Patient to followup new problem re\re exacerbation of back pain with radicular symptoms down the right leg.  She's had an MRI epidural steroids oral steroids pain medication physical therapy and presents now saying that I really want have the surgery I can't take it anymore. She went to the ER over the weekend got a steroid Dosepak and a refill on her pain medicine for her right leg radicular symptoms associated with some giving way of the right knee  I agree that she needs to go ahead and have the surgery will make the referral I gave her steroid Dosepak on an as-needed basis in case she has another attack before the surgery can be scheduled. She received her Percocet for her chronic pain as well.  MRI dated 05/19/2013 There is moderate  disc space narrowing and disc desiccation at L4-5. At L4-5, there is  a mild circumferential disc bulge with superimposed moderate sized  central and right subarticular disc extrusion with disc material  extending superiorly in the right lateral recess. This results in  mild right greater than left lateral recess narrowing and mild to  moderate proximal right neural foraminal stenosis with likely mass  effect on the exiting right L4 nerve.  Meds ordered this encounter  Medications  . oxyCODONE-acetaminophen (PERCOCET) 7.5-325 MG per tablet    Sig: Take 1 tablet by mouth every 4 (four) hours as needed for pain.    Dispense:  180 tablet    Refill:  0  . predniSONE (STERAPRED UNI-PAK) 10 MG tablet    Sig: As directed    Dispense:  48 tablet    Refill:  0    Referral to neurosurgery for right leg radicular pain secondary to disc problem

## 2014-01-15 NOTE — Telephone Encounter (Signed)
Patient has appointment with Dr Joya Salm 01/21/14 @ 11am

## 2014-02-02 ENCOUNTER — Other Ambulatory Visit (HOSPITAL_COMMUNITY): Payer: Self-pay | Admitting: Neurosurgery

## 2014-02-02 DIAGNOSIS — M5126 Other intervertebral disc displacement, lumbar region: Secondary | ICD-10-CM

## 2014-02-08 ENCOUNTER — Ambulatory Visit (HOSPITAL_COMMUNITY): Payer: BC Managed Care – PPO

## 2014-02-09 ENCOUNTER — Encounter (HOSPITAL_COMMUNITY): Payer: Self-pay

## 2014-02-09 ENCOUNTER — Other Ambulatory Visit (HOSPITAL_COMMUNITY): Payer: BC Managed Care – PPO

## 2014-02-09 ENCOUNTER — Ambulatory Visit (HOSPITAL_COMMUNITY)
Admission: RE | Admit: 2014-02-09 | Discharge: 2014-02-09 | Disposition: A | Discharge status: Home / Self Care | Payer: BC Managed Care – PPO | Source: Ambulatory Visit | Attending: Neurosurgery | Admitting: Neurosurgery

## 2014-02-09 DIAGNOSIS — M5126 Other intervertebral disc displacement, lumbar region: Secondary | ICD-10-CM | POA: Diagnosis not present

## 2014-02-09 DIAGNOSIS — M545 Low back pain: Secondary | ICD-10-CM | POA: Diagnosis present

## 2014-02-11 ENCOUNTER — Telehealth: Payer: Self-pay | Admitting: Orthopedic Surgery

## 2014-02-11 ENCOUNTER — Other Ambulatory Visit: Payer: Self-pay | Admitting: *Deleted

## 2014-02-11 DIAGNOSIS — G8929 Other chronic pain: Secondary | ICD-10-CM

## 2014-02-11 MED ORDER — OXYCODONE-ACETAMINOPHEN 7.5-325 MG PO TABS: 1.0000 | ORAL_TABLET | ORAL | Status: DC | PRN

## 2014-02-11 NOTE — Telephone Encounter (Signed)
Prescription available for pickup, called patient, no answer, left message

## 2014-02-18 NOTE — Telephone Encounter (Signed)
Patient had picked up prescription following message left 02/11/14.

## 2014-03-17 ENCOUNTER — Other Ambulatory Visit: Payer: Self-pay | Admitting: *Deleted

## 2014-03-17 ENCOUNTER — Telehealth: Payer: Self-pay | Admitting: Orthopedic Surgery

## 2014-03-17 DIAGNOSIS — G8929 Other chronic pain: Secondary | ICD-10-CM

## 2014-03-17 MED ORDER — OXYCODONE-ACETAMINOPHEN 7.5-325 MG PO TABS: 1.0000 | ORAL_TABLET | ORAL | Status: DC | PRN

## 2014-03-17 NOTE — Telephone Encounter (Signed)
Patient requests refill on pain medication Percocet 7.5; also requested appointment, left knee, which I have scheduled. Please advise

## 2014-03-18 NOTE — Telephone Encounter (Signed)
Prescription available for pick up

## 2014-04-06 ENCOUNTER — Encounter: Payer: Self-pay | Admitting: Orthopedic Surgery

## 2014-04-06 ENCOUNTER — Ambulatory Visit: Payer: BC Managed Care – PPO | Admitting: Orthopedic Surgery

## 2014-04-12 ENCOUNTER — Ambulatory Visit: Payer: BC Managed Care – PPO | Admitting: Orthopedic Surgery

## 2014-04-12 ENCOUNTER — Encounter: Payer: Self-pay | Admitting: Orthopedic Surgery

## 2014-05-17 ENCOUNTER — Other Ambulatory Visit (HOSPITAL_COMMUNITY): Payer: Self-pay | Admitting: Neurosurgery

## 2014-05-17 ENCOUNTER — Ambulatory Visit (HOSPITAL_COMMUNITY)
Admission: RE | Admit: 2014-05-17 | Discharge: 2014-05-17 | Disposition: A | Discharge status: Home / Self Care | Payer: 59 | Source: Ambulatory Visit | Attending: Neurosurgery | Admitting: Neurosurgery

## 2014-05-17 DIAGNOSIS — M545 Low back pain, unspecified: Secondary | ICD-10-CM

## 2014-05-17 DIAGNOSIS — M5126 Other intervertebral disc displacement, lumbar region: Secondary | ICD-10-CM | POA: Insufficient documentation

## 2014-05-17 DIAGNOSIS — M4686 Other specified inflammatory spondylopathies, lumbar region: Secondary | ICD-10-CM | POA: Diagnosis not present

## 2014-05-17 MED ORDER — GADOBENATE DIMEGLUMINE 529 MG/ML IV SOLN
13.0000 mL | Freq: Once | INTRAVENOUS | Status: AC | PRN
  Administered 2014-05-17: 13 mL via INTRAVENOUS

## 2014-05-25 ENCOUNTER — Other Ambulatory Visit: Payer: Self-pay | Admitting: Neurosurgery

## 2014-05-29 NOTE — Pre-Procedure Instructions (Signed)
Karen Mercer  05/29/2014   Your procedure is scheduled on:  January 26  Report to [facility] Admitting at 08:30 AM.  Call this number if you have problems the morning of surgery: [phone]   Remember:   Do not eat food or drink liquids after midnight.   Take these medicines the morning of surgery with A SIP OF WATER: Gabapentin, Oxycodone (if needed)   STOP/ Do not take Aspirin, Aleve, Naproxen, Advil, Ibuprofen, Motrin, Vitamins, Herbs, or Supplements today    Do not wear jewelry, make-up or nail polish.  Do not wear lotions, powders, or perfumes. You may wear deodorant.  Do not shave 48 hours prior to surgery. Men may shave face and neck.  Do not bring valuables to the hospital.  [facility] is not responsible for any belongings or valuables.               Contacts, dentures or bridgework may not be worn into surgery.  Leave suitcase in the car. After surgery it may be brought to your room.  For patients admitted to the hospital, discharge time is determined by your treatment team.               Special Instructions: [facility] - Preparing for Surgery  Before surgery, you can play an important role.  Because skin is not sterile, your skin needs to be as free of germs as possible.  You can reduce the number of germs on you skin by washing with CHG (chlorahexidine gluconate) soap before surgery.  CHG is an antiseptic cleaner which kills germs and bonds with the skin to continue killing germs even after washing.  Please DO NOT use if you have an allergy to CHG or antibacterial soaps.  If your skin becomes reddened/irritated stop using the CHG and inform your nurse when you arrive at Short Stay.  Do not shave (including legs and underarms) for at least 48 hours prior to the first CHG shower.  You may shave your face.  Please follow these instructions carefully:   1.  Shower with CHG Soap the night before surgery and the morning of Surgery.  2.  If you choose to  wash your hair, wash your hair first as usual with your normal shampoo.  3.  After you shampoo, rinse your hair and body thoroughly to remove the shampoo.  4.  Use CHG as you would any other liquid soap.  You can apply CHG directly to the skin and wash gently with scrungie or a clean washcloth.  5.  Apply the CHG Soap to your body ONLY FROM THE NECK DOWN.  Do not use on open wounds or open sores.  Avoid contact with your eyes, ears, mouth and genitals (private parts).  Wash genitals (private parts) with your normal soap.  6.  Wash thoroughly, paying special attention to the area where your surgery will be performed.  7.  Thoroughly rinse your body with warm water from the neck down.  8.  DO NOT shower/wash with your normal soap after using and rinsing off the CHG Soap.  9.  Pat yourself dry with a clean towel.            10.  Wear clean pajamas.            11.  Place clean sheets on your bed the night of your first shower and do not sleep with pets.  Day of Surgery  Do not apply any lotions  the morning of surgery.  Please wear clean clothes to the hospital/surgery center.     Please read over the following fact sheets that you were given: Pain Booklet, Coughing and Deep Breathing and Surgical Site Infection Prevention

## 2014-05-31 ENCOUNTER — Inpatient Hospital Stay (HOSPITAL_COMMUNITY)
Admission: RE | Admit: 2014-05-31 | Discharge: 2014-05-31 | Disposition: A | Discharge status: Home / Self Care | Payer: 59 | Source: Ambulatory Visit

## 2014-06-04 ENCOUNTER — Encounter (HOSPITAL_COMMUNITY): Payer: Self-pay

## 2014-06-04 ENCOUNTER — Encounter (HOSPITAL_COMMUNITY)
Admission: RE | Admit: 2014-06-04 | Discharge: 2014-06-04 | Disposition: A | Discharge status: Home / Self Care | Payer: 59 | Source: Ambulatory Visit | Attending: Neurosurgery | Admitting: Neurosurgery

## 2014-06-04 HISTORY — DX: Myoneural disorder, unspecified: G70.9

## 2014-06-04 HISTORY — DX: Other complications of anesthesia, initial encounter: T88.59XA

## 2014-06-04 HISTORY — DX: Adverse effect of unspecified anesthetic, initial encounter: T41.45XA

## 2014-06-04 LAB — BASIC METABOLIC PANEL
Anion gap: 5 (ref 5–15)
BUN: 7 mg/dL (ref 6–23)
CHLORIDE: 104 mmol/L (ref 96–112)
CO2: 29 mmol/L (ref 19–32)
Calcium: 9.1 mg/dL (ref 8.4–10.5)
Creatinine, Ser: 0.63 mg/dL (ref 0.50–1.10)
GFR calc Af Amer: >90 mL/min (ref >90)
GFR calc non Af Amer: >90 mL/min (ref >90)
Glucose, Bld: 119 mg/dL — ABNORMAL HIGH (ref 70–99)
Potassium: 3.5 mmol/L (ref 3.5–5.1)
Sodium: 138 mmol/L (ref 135–145)

## 2014-06-04 LAB — CBC
HEMATOCRIT: 34.9 % — AB (ref 36.0–46.0)
Hemoglobin: 11.4 g/dL — ABNORMAL LOW (ref 12.0–15.0)
MCH: 28.1 pg (ref 26.0–34.0)
MCHC: 32.7 g/dL (ref 30.0–36.0)
MCV: 86 fL (ref 78.0–100.0)
Platelets: 272 10*3/uL (ref 150–400)
RBC: 4.06 MIL/uL (ref 3.87–5.11)
RDW: 16.5 % — ABNORMAL HIGH (ref 11.5–15.5)
WBC: 9.8 10*3/uL (ref 4.0–10.5)

## 2014-06-04 LAB — SURGICAL PCR SCREEN
MRSA, PCR: NEGATIVE
Staphylococcus aureus: NEGATIVE

## 2014-06-04 LAB — HCG, SERUM, QUALITATIVE: Preg, Serum: NEGATIVE

## 2014-06-04 NOTE — Progress Notes (Signed)
Primary - dr. Legrand Rams Linna Hoff) No cardiologist No prior cardiac testing

## 2014-06-04 NOTE — Pre-Procedure Instructions (Signed)
Karen Mercer  06/04/2014   Your procedure is scheduled on:  Friday, February 5th  Report to [facility] Admitting at 1145 AM.  Call this number if you have problems the morning of surgery: [phone]   Remember:   Do not eat food or drink liquids after midnight.   Take these medicines the morning of surgery with A SIP OF WATER: neurontin, pain medication if needed  Stop taking aspirin, OTC vitamins/herbal medications, NSAIDS (ibuprofen, advil, motrin) 7 days prior to surgery.   Do not wear jewelry, make-up or nail polish.  Do not wear lotions, powders, or perfume,deodorant.  Do not shave 48 hours prior to surgery. Men may shave face and neck.  Do not bring valuables to the hospital.  [facility] is not responsible   for any belongings or valuables.               Contacts, dentures or bridgework may not be worn into surgery.  Leave suitcase in the car. After surgery it may be brought to your room.  For patients admitted to the hospital, discharge time is determined by your treatment team.               Patients discharged the day of surgery will not be allowed to drive home.  Please read over the following fact sheets that you were given: Pain Booklet, Coughing and Deep Breathing, MRSA Information and Surgical Site Infection Prevention  [facility] - Preparing for Surgery  Before surgery, you can play an important role.  Because skin is not sterile, your skin needs to be as free of germs as possible.  You can reduce the number of germs on you skin by washing with CHG (chlorahexidine gluconate) soap before surgery.  CHG is an antiseptic cleaner which kills germs and bonds with the skin to continue killing germs even after washing.  Please DO NOT use if you have an allergy to CHG or antibacterial soaps.  If your skin becomes reddened/irritated stop using the CHG and inform your nurse when you arrive at Short Stay.  Do not shave (including legs and underarms) for at least  48 hours prior to the first CHG shower.  You may shave your face.  Please follow these instructions carefully:   1.  Shower with CHG Soap the night before surgery and the morning of Surgery.  2.  If you choose to wash your hair, wash your hair first as usual with your normal shampoo.  3.  After you shampoo, rinse your hair and body thoroughly to remove the shampoo.  4.  Use CHG as you would any other liquid soap.  You can apply CHG directly to the skin and wash gently with scrungie or a clean washcloth.  5.  Apply the CHG Soap to your body ONLY FROM THE NECK DOWN.  Do not use on open wounds or open sores.  Avoid contact with your eyes, ears, mouth and genitals (private parts).  Wash genitals (private parts) with your normal soap.  6.  Wash thoroughly, paying special attention to the area where your surgery will be performed.  7.  Thoroughly rinse your body with warm water from the neck down.  8.  DO NOT shower/wash with your normal soap after using and rinsing off the CHG Soap.  9.  Pat yourself dry with a clean towel.            10.  Wear clean pajamas.  11.  Place clean sheets on your bed the night of your first shower and do not sleep with pets.  Day of Surgery  Do not apply any lotions/deoderants the morning of surgery.  Please wear clean clothes to the hospital/surgery center.

## 2014-06-10 MED ORDER — CEFAZOLIN SODIUM-DEXTROSE 2-3 GM-% IV SOLR
2.0000 g | INTRAVENOUS | Status: AC
  Administered 2014-06-11: 2 g via INTRAVENOUS
  Filled 2014-06-10: qty 50

## 2014-06-10 NOTE — H&P (Signed)
Karen Mercer is an 44 y.o. female.   Chief Complaint: recurrent right leg pain HPI: patient who had a right l45 discectomy, did well but lately she is having the same symptoms no better with conservative treatment. 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 Disc   . Complication of anesthesia     had extreme headache after last back surgery - was in pacu for extended period  . Neuromuscular disorder     Past Surgical History  Procedure Laterality Date  . Right arm      rods from fractured arm  . Tubal ligation    . Knee surgery  2012    right knee-arthroscopy  . Knee arthroscopy  08/17/2011    Procedure: ARTHROSCOPY KNEE;  Surgeon: Carole Civil, MD;  Location: [facility];  Service: Orthopedics;  Laterality: Right;  with tibia tubercle transfer and proximal realignment  . Knee arthroscopy with fulkerson slide Left 08/29/2012    Procedure: Arthroscopic lateral release with chondroplasty left knee;  Surgeon: Carole Civil, MD;  Location: [facility];  Service: Orthopedics;  Laterality: Left;  . Knee arthroscopy with medial patellar femoral ligament reconstruction Left 08/29/2012    Procedure: Open proximal realignment fulkerson;  Surgeon: Carole Civil, MD;  Location: [facility];  Service: Orthopedics;  Laterality: Left;  . Back surgery      at surgical center    Family History  Problem Relation Age of Onset  . Arthritis      family history    Social History:  reports that she has been smoking Cigarettes.  She has a 8.5 pack-year smoking history. She does not have any smokeless tobacco history on file. She reports that she drinks alcohol. She reports that she does not use illicit drugs.  Allergies:  Allergies  Allergen Reactions  . Tramadol     Nausea     No prescriptions prior to admission    No results found for this or any previous visit (from the past 48 hour(s)). No results found.  Review of Systems  Constitutional:  Negative.   HENT: Negative.   Eyes: Negative.   Respiratory: Negative.   Cardiovascular: Negative.   Gastrointestinal: Negative.   Genitourinary: Negative.   Musculoskeletal: Positive for back pain.  Skin: Negative.   Neurological: Positive for sensory change and focal weakness.  Endo/Heme/Allergies: Negative.   Psychiatric/Behavioral: Negative.     Last menstrual period 04/25/2014. Physical Exam patient came with her uncle limping from the right leg. Hent, nl. Neck, nl. Cv, nl. Lungs, clear abdomen, nl. Extremities, nl. NEURO weakness of df both feet right worse. slr positive bilaterally. 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   Assessment/Plan Patient declined epidural injections and wishes to proceed with right l45 discectomy. She is aware of risks and benefits including the possibility of fusion at a later day  [name] 06/10/2014, 5:57 PM

## 2014-06-11 ENCOUNTER — Inpatient Hospital Stay (HOSPITAL_COMMUNITY)
Admission: RE | Admit: 2014-06-11 | Discharge: 2014-06-13 | DRG: 520 | Disposition: A | Discharge status: Home / Self Care | Payer: 59 | Source: Ambulatory Visit | Attending: Neurosurgery | Admitting: Neurosurgery

## 2014-06-11 ENCOUNTER — Encounter (HOSPITAL_COMMUNITY): Payer: Self-pay | Admitting: *Deleted

## 2014-06-11 ENCOUNTER — Ambulatory Visit (HOSPITAL_COMMUNITY): Payer: 59 | Admitting: Anesthesiology

## 2014-06-11 ENCOUNTER — Ambulatory Visit (HOSPITAL_COMMUNITY): Payer: 59

## 2014-06-11 ENCOUNTER — Encounter (HOSPITAL_COMMUNITY)
Admission: RE | Disposition: A | Discharge status: Home / Self Care | Payer: Self-pay | Source: Ambulatory Visit | Attending: Neurosurgery

## 2014-06-11 DIAGNOSIS — M5136 Other intervertebral disc degeneration, lumbar region: Secondary | ICD-10-CM

## 2014-06-11 DIAGNOSIS — M47816 Spondylosis without myelopathy or radiculopathy, lumbar region: Secondary | ICD-10-CM | POA: Diagnosis present

## 2014-06-11 DIAGNOSIS — Z01812 Encounter for preprocedural laboratory examination: Secondary | ICD-10-CM | POA: Diagnosis not present

## 2014-06-11 DIAGNOSIS — M5126 Other intervertebral disc displacement, lumbar region: Secondary | ICD-10-CM | POA: Diagnosis present

## 2014-06-11 DIAGNOSIS — F17210 Nicotine dependence, cigarettes, uncomplicated: Secondary | ICD-10-CM | POA: Diagnosis present

## 2014-06-11 DIAGNOSIS — M5116 Intervertebral disc disorders with radiculopathy, lumbar region: Principal | ICD-10-CM | POA: Diagnosis present

## 2014-06-11 DIAGNOSIS — M79604 Pain in right leg: Secondary | ICD-10-CM | POA: Diagnosis present

## 2014-06-11 HISTORY — PX: LUMBAR LAMINECTOMY/DECOMPRESSION MICRODISCECTOMY: SHX5026

## 2014-06-11 LAB — GLUCOSE, CAPILLARY: Glucose-Capillary: 98 mg/dL (ref 70–99)

## 2014-06-11 SURGERY — LUMBAR LAMINECTOMY/DECOMPRESSION MICRODISCECTOMY 1 LEVEL
Anesthesia: General | Site: Spine Lumbar | Laterality: Right

## 2014-06-11 MED ORDER — LIDOCAINE HCL (CARDIAC) 20 MG/ML IV SOLN
INTRAVENOUS | Status: AC
  Filled 2014-06-11: qty 5

## 2014-06-11 MED ORDER — SODIUM CHLORIDE 0.9 % IJ SOLN: 9.0000 mL | INTRAMUSCULAR | Status: DC | PRN

## 2014-06-11 MED ORDER — DIPHENHYDRAMINE HCL 12.5 MG/5ML PO ELIX: 12.5000 mg | ORAL_SOLUTION | Freq: Four times a day (QID) | ORAL | Status: DC | PRN

## 2014-06-11 MED ORDER — ONDANSETRON HCL 4 MG/2ML IJ SOLN: 4.0000 mg | Freq: Four times a day (QID) | INTRAMUSCULAR | Status: DC | PRN

## 2014-06-11 MED ORDER — MORPHINE SULFATE 2 MG/ML IJ SOLN
1.0000 mg | INTRAMUSCULAR | Status: DC | PRN
  Administered 2014-06-11: 3 mg via INTRAVENOUS
  Filled 2014-06-11: qty 2

## 2014-06-11 MED ORDER — SODIUM CHLORIDE 0.9 % IJ SOLN
3.0000 mL | Freq: Two times a day (BID) | INTRAMUSCULAR | Status: DC
Start: 2014-06-11 — End: 2014-06-13
  Administered 2014-06-11 – 2014-06-12 (×2): 3 mL via INTRAVENOUS

## 2014-06-11 MED ORDER — OXYCODONE-ACETAMINOPHEN 5-325 MG PO TABS
2.0000 | ORAL_TABLET | ORAL | Status: DC | PRN
  Administered 2014-06-12 – 2014-06-13 (×6): 2 via ORAL
  Filled 2014-06-11 (×6): qty 2

## 2014-06-11 MED ORDER — DIPHENHYDRAMINE HCL 50 MG/ML IJ SOLN: 12.5000 mg | Freq: Four times a day (QID) | INTRAMUSCULAR | Status: DC | PRN

## 2014-06-11 MED ORDER — MIDAZOLAM HCL 5 MG/5ML IJ SOLN
INTRAMUSCULAR | Status: DC | PRN
  Administered 2014-06-11: 2 mg via INTRAVENOUS

## 2014-06-11 MED ORDER — FENTANYL CITRATE 0.05 MG/ML IJ SOLN
INTRAMUSCULAR | Status: AC
  Filled 2014-06-11: qty 5

## 2014-06-11 MED ORDER — MORPHINE SULFATE (PF) 1 MG/ML IV SOLN: INTRAVENOUS | Status: DC

## 2014-06-11 MED ORDER — BUPIVACAINE LIPOSOME 1.3 % IJ SUSP
20.0000 mL | INTRAMUSCULAR | Status: AC
  Administered 2014-06-11: 20 mL
  Filled 2014-06-11: qty 20

## 2014-06-11 MED ORDER — ROCURONIUM BROMIDE 50 MG/5ML IV SOLN
INTRAVENOUS | Status: AC
  Filled 2014-06-11: qty 1

## 2014-06-11 MED ORDER — 0.9 % SODIUM CHLORIDE (POUR BTL) OPTIME
TOPICAL | Status: DC | PRN
  Administered 2014-06-11: 1000 mL

## 2014-06-11 MED ORDER — ONDANSETRON HCL 4 MG/2ML IJ SOLN
INTRAMUSCULAR | Status: DC | PRN
  Administered 2014-06-11: 4 mg via INTRAVENOUS

## 2014-06-11 MED ORDER — GABAPENTIN 100 MG PO CAPS
100.0000 mg | ORAL_CAPSULE | Freq: Three times a day (TID) | ORAL | Status: DC
  Administered 2014-06-11 – 2014-06-13 (×5): 100 mg via ORAL
  Filled 2014-06-11 (×5): qty 1

## 2014-06-11 MED ORDER — NALOXONE HCL 0.4 MG/ML IJ SOLN: 0.4000 mg | INTRAMUSCULAR | Status: DC | PRN

## 2014-06-11 MED ORDER — NEOSTIGMINE METHYLSULFATE 10 MG/10ML IV SOLN
INTRAVENOUS | Status: DC | PRN
Start: 2014-06-11 — End: 2014-06-11
  Administered 2014-06-11: 3 mg via INTRAVENOUS

## 2014-06-11 MED ORDER — LIDOCAINE HCL (CARDIAC) 20 MG/ML IV SOLN
INTRAVENOUS | Status: DC | PRN
  Administered 2014-06-11: 30 mg via INTRAVENOUS

## 2014-06-11 MED ORDER — MIDAZOLAM HCL 2 MG/2ML IJ SOLN
INTRAMUSCULAR | Status: AC
  Filled 2014-06-11: qty 2

## 2014-06-11 MED ORDER — HEMOSTATIC AGENTS (NO CHARGE) OPTIME
TOPICAL | Status: DC | PRN
  Administered 2014-06-11: 1 via TOPICAL

## 2014-06-11 MED ORDER — ONDANSETRON HCL 4 MG/2ML IJ SOLN
INTRAMUSCULAR | Status: AC
  Filled 2014-06-11: qty 2

## 2014-06-11 MED ORDER — ZOLPIDEM TARTRATE 5 MG PO TABS
5.0000 mg | ORAL_TABLET | Freq: Every evening | ORAL | Status: DC | PRN
  Administered 2014-06-12: 5 mg via ORAL
  Filled 2014-06-11: qty 1

## 2014-06-11 MED ORDER — PROPOFOL 10 MG/ML IV BOLUS
INTRAVENOUS | Status: AC
  Filled 2014-06-11: qty 20

## 2014-06-11 MED ORDER — SODIUM CHLORIDE 0.9 % IV SOLN
INTRAVENOUS | Status: DC
  Administered 2014-06-12: 05:00:00 via INTRAVENOUS

## 2014-06-11 MED ORDER — PHENOL 1.4 % MT LIQD: 1.0000 | OROMUCOSAL | Status: DC | PRN

## 2014-06-11 MED ORDER — LACTATED RINGERS IV SOLN
INTRAVENOUS | Status: DC
  Administered 2014-06-11 (×2): via INTRAVENOUS

## 2014-06-11 MED ORDER — PROMETHAZINE HCL 25 MG/ML IJ SOLN: 6.2500 mg | INTRAMUSCULAR | Status: DC | PRN

## 2014-06-11 MED ORDER — GLYCOPYRROLATE 0.2 MG/ML IJ SOLN
INTRAMUSCULAR | Status: DC | PRN
  Administered 2014-06-11: 0.4 mg via INTRAVENOUS

## 2014-06-11 MED ORDER — LACTATED RINGERS IV SOLN
INTRAVENOUS | Status: DC | PRN
  Administered 2014-06-11 (×2): via INTRAVENOUS

## 2014-06-11 MED ORDER — THROMBIN 5000 UNITS EX SOLR
CUTANEOUS | Status: DC | PRN
  Administered 2014-06-11 (×2): 5000 [IU] via TOPICAL

## 2014-06-11 MED ORDER — DOCUSATE SODIUM 100 MG PO CAPS
100.0000 mg | ORAL_CAPSULE | Freq: Two times a day (BID) | ORAL | Status: DC
Start: 2014-06-11 — End: 2014-06-13
  Administered 2014-06-11 – 2014-06-13 (×4): 100 mg via ORAL
  Filled 2014-06-11 (×4): qty 1

## 2014-06-11 MED ORDER — MORPHINE SULFATE (PF) 1 MG/ML IV SOLN
INTRAVENOUS | Status: DC
  Administered 2014-06-11: 1 mg via INTRAVENOUS
  Administered 2014-06-12: 11:00:00 via INTRAVENOUS
  Administered 2014-06-12: 15 mg via INTRAVENOUS
  Administered 2014-06-12: 19.5 mg via INTRAVENOUS
  Administered 2014-06-12: 4.5 mg via INTRAVENOUS
  Administered 2014-06-12: 15 mg via INTRAVENOUS
  Administered 2014-06-13: 9 mg via INTRAVENOUS
  Administered 2014-06-13: 0 mg via INTRAVENOUS
  Administered 2014-06-13: 3 mg via INTRAVENOUS
  Filled 2014-06-11 (×3): qty 25

## 2014-06-11 MED ORDER — SODIUM CHLORIDE 0.9 % IV SOLN: 250.0000 mL | INTRAVENOUS | Status: DC

## 2014-06-11 MED ORDER — MENTHOL 3 MG MT LOZG: 1.0000 | LOZENGE | OROMUCOSAL | Status: DC | PRN

## 2014-06-11 MED ORDER — FENTANYL CITRATE 0.05 MG/ML IJ SOLN
25.0000 ug | INTRAMUSCULAR | Status: DC | PRN
  Administered 2014-06-11 (×2): 50 ug via INTRAVENOUS

## 2014-06-11 MED ORDER — DIAZEPAM 5 MG PO TABS
ORAL_TABLET | ORAL | Status: AC
  Filled 2014-06-11: qty 1

## 2014-06-11 MED ORDER — ROCURONIUM BROMIDE 100 MG/10ML IV SOLN
INTRAVENOUS | Status: DC | PRN
  Administered 2014-06-11: 50 mg via INTRAVENOUS

## 2014-06-11 MED ORDER — PROPOFOL 10 MG/ML IV BOLUS
INTRAVENOUS | Status: DC | PRN
  Administered 2014-06-11: 200 mg via INTRAVENOUS
  Administered 2014-06-11: 50 mg via INTRAVENOUS

## 2014-06-11 MED ORDER — ACETAMINOPHEN 325 MG PO TABS: 650.0000 mg | ORAL_TABLET | ORAL | Status: DC | PRN

## 2014-06-11 MED ORDER — DIAZEPAM 5 MG PO TABS
5.0000 mg | ORAL_TABLET | Freq: Four times a day (QID) | ORAL | Status: DC | PRN
  Administered 2014-06-11 – 2014-06-12 (×3): 5 mg via ORAL
  Filled 2014-06-11 (×2): qty 1

## 2014-06-11 MED ORDER — OXYCODONE HCL 5 MG PO TABS: 10.0000 mg | ORAL_TABLET | Freq: Once | ORAL | Status: DC

## 2014-06-11 MED ORDER — FENTANYL CITRATE 0.05 MG/ML IJ SOLN
INTRAMUSCULAR | Status: DC | PRN
  Administered 2014-06-11: 50 ug via INTRAVENOUS
  Administered 2014-06-11: 250 ug via INTRAVENOUS
  Administered 2014-06-11: 100 ug via INTRAVENOUS

## 2014-06-11 MED ORDER — FENTANYL CITRATE 0.05 MG/ML IJ SOLN
INTRAMUSCULAR | Status: AC
  Filled 2014-06-11: qty 2

## 2014-06-11 MED ORDER — ONDANSETRON HCL 4 MG/2ML IJ SOLN
4.0000 mg | INTRAMUSCULAR | Status: DC | PRN
  Administered 2014-06-12: 4 mg via INTRAVENOUS
  Filled 2014-06-11: qty 2

## 2014-06-11 MED ORDER — ACETAMINOPHEN 650 MG RE SUPP: 650.0000 mg | RECTAL | Status: DC | PRN

## 2014-06-11 MED ORDER — CEFAZOLIN SODIUM 1-5 GM-% IV SOLN
1.0000 g | Freq: Three times a day (TID) | INTRAVENOUS | Status: AC
  Administered 2014-06-11 – 2014-06-12 (×2): 1 g via INTRAVENOUS
  Filled 2014-06-11 (×2): qty 50

## 2014-06-11 MED ORDER — OXYCODONE HCL 5 MG PO TABS
ORAL_TABLET | ORAL | Status: AC
  Filled 2014-06-11: qty 2

## 2014-06-11 MED ORDER — SODIUM CHLORIDE 0.9 % IJ SOLN: 3.0000 mL | INTRAMUSCULAR | Status: DC | PRN

## 2014-06-11 SURGICAL SUPPLY — 60 items
APL SKNCLS STERI-STRIP NONHPOA (GAUZE/BANDAGES/DRESSINGS) ×1
APL SRG 60D 8 XTD TIP BNDBL (TIP) ×1
BENZOIN TINCTURE PRP APPL 2/3 (GAUZE/BANDAGES/DRESSINGS) ×3 IMPLANT
BLADE CLIPPER SURG (BLADE) IMPLANT
BUR ACORN 6.0 (BURR) ×2 IMPLANT
BUR ACORN 6.0MM (BURR) ×1
BUR MATCHSTICK NEURO 3.0 LAGG (BURR) IMPLANT
CANISTER SUCT 3000ML (MISCELLANEOUS) ×3 IMPLANT
CLOSURE WOUND 1/2 X4 (GAUZE/BANDAGES/DRESSINGS) ×1
CONT SPEC 4OZ CLIKSEAL STRL BL (MISCELLANEOUS) ×3 IMPLANT
DRAPE LAPAROTOMY 100X72X124 (DRAPES) ×3 IMPLANT
DRAPE MICROSCOPE LEICA (MISCELLANEOUS) ×3 IMPLANT
DRAPE POUCH INSTRU U-SHP 10X18 (DRAPES) ×3 IMPLANT
DRSG OPSITE POSTOP 4X6 (GAUZE/BANDAGES/DRESSINGS) ×2 IMPLANT
DRSG PAD ABDOMINAL 8X10 ST (GAUZE/BANDAGES/DRESSINGS) IMPLANT
DURAPREP 26ML APPLICATOR (WOUND CARE) ×3 IMPLANT
DURASEAL APPLICATOR TIP (TIP) ×2 IMPLANT
DURASEAL SPINE SEALANT 3ML (MISCELLANEOUS) ×2 IMPLANT
ELECT REM PT RETURN 9FT ADLT (ELECTROSURGICAL) ×3
ELECTRODE REM PT RTRN 9FT ADLT (ELECTROSURGICAL) ×1 IMPLANT
GAUZE SPONGE 4X4 12PLY STRL (GAUZE/BANDAGES/DRESSINGS) ×3 IMPLANT
GAUZE SPONGE 4X4 16PLY XRAY LF (GAUZE/BANDAGES/DRESSINGS) IMPLANT
GLOVE BIOGEL M 8.0 STRL (GLOVE) ×3 IMPLANT
GLOVE EXAM NITRILE LRG STRL (GLOVE) IMPLANT
GLOVE EXAM NITRILE MD LF STRL (GLOVE) IMPLANT
GLOVE EXAM NITRILE XL STR (GLOVE) IMPLANT
GLOVE EXAM NITRILE XS STR PU (GLOVE) IMPLANT
GOWN STRL REUS W/ TWL LRG LVL3 (GOWN DISPOSABLE) ×1 IMPLANT
GOWN STRL REUS W/ TWL XL LVL3 (GOWN DISPOSABLE) IMPLANT
GOWN STRL REUS W/TWL 2XL LVL3 (GOWN DISPOSABLE) IMPLANT
GOWN STRL REUS W/TWL LRG LVL3 (GOWN DISPOSABLE) ×3
GOWN STRL REUS W/TWL XL LVL3 (GOWN DISPOSABLE)
KIT BASIN OR (CUSTOM PROCEDURE TRAY) ×3 IMPLANT
KIT ROOM TURNOVER OR (KITS) ×3 IMPLANT
NDL HYPO 18GX1.5 BLUNT FILL (NEEDLE) IMPLANT
NDL HYPO 21X1.5 SAFETY (NEEDLE) IMPLANT
NDL HYPO 25X1 1.5 SAFETY (NEEDLE) IMPLANT
NDL SPNL 20GX3.5 QUINCKE YW (NEEDLE) IMPLANT
NEEDLE HYPO 18GX1.5 BLUNT FILL (NEEDLE) IMPLANT
NEEDLE HYPO 21X1.5 SAFETY (NEEDLE) IMPLANT
NEEDLE HYPO 25X1 1.5 SAFETY (NEEDLE) IMPLANT
NEEDLE SPNL 20GX3.5 QUINCKE YW (NEEDLE) IMPLANT
NS IRRIG 1000ML POUR BTL (IV SOLUTION) ×3 IMPLANT
PACK LAMINECTOMY NEURO (CUSTOM PROCEDURE TRAY) ×3 IMPLANT
PAD ARMBOARD 7.5X6 YLW CONV (MISCELLANEOUS) ×9 IMPLANT
PATTIES SURGICAL .5 X1 (DISPOSABLE) ×3 IMPLANT
RUBBERBAND STERILE (MISCELLANEOUS) ×6 IMPLANT
SPONGE LAP 4X18 X RAY DECT (DISPOSABLE) IMPLANT
SPONGE SURGIFOAM ABS GEL SZ50 (HEMOSTASIS) ×3 IMPLANT
STRIP CLOSURE SKIN 1/2X4 (GAUZE/BANDAGES/DRESSINGS) ×2 IMPLANT
SUT VIC AB 0 CT1 18XCR BRD8 (SUTURE) ×1 IMPLANT
SUT VIC AB 0 CT1 8-18 (SUTURE) ×3
SUT VIC AB 2-0 CP2 18 (SUTURE) ×3 IMPLANT
SUT VIC AB 3-0 SH 8-18 (SUTURE) ×3 IMPLANT
SYR 20CC LL (SYRINGE) IMPLANT
SYR 20ML ECCENTRIC (SYRINGE) ×3 IMPLANT
SYR 5ML LL (SYRINGE) IMPLANT
TOWEL OR 17X24 6PK STRL BLUE (TOWEL DISPOSABLE) ×3 IMPLANT
TOWEL OR 17X26 10 PK STRL BLUE (TOWEL DISPOSABLE) ×3 IMPLANT
WATER STERILE IRR 1000ML POUR (IV SOLUTION) ×3 IMPLANT

## 2014-06-11 NOTE — Anesthesia Postprocedure Evaluation (Signed)
  Anesthesia Post-op Note  Patient: Karen Mercer  Procedure(s) Performed: Procedure(s) with comments: Right L4-5 Lumbar diskectomy (Right) - Right L4-5 Lumbar diskectomy  Patient Location: PACU  Anesthesia Type:General  Level of Consciousness: awake  Airway and Oxygen Therapy: Patient Spontanous Breathing  Post-op Pain: mild  Post-op Assessment: Post-op Vital signs reviewed  Post-op Vital Signs: Reviewed  Last Vitals:  Filed Vitals:   06/11/14 1825  BP:   Pulse: 78  Temp: 36.7 C  Resp:     Complications: No apparent anesthesia complications

## 2014-06-11 NOTE — Progress Notes (Signed)
Patient ID: Karen Mercer, female   DOB: [birth year], 44 y.o.   MRN: [id number] No weakness, c/o incisional pain. To start pca morphine

## 2014-06-11 NOTE — Transfer of Care (Signed)
Immediate Anesthesia Transfer of Care Note  Patient: Karen Mercer  Procedure(s) Performed: Procedure(s) with comments: Right L4-5 Lumbar diskectomy (Right) - Right L4-5 Lumbar diskectomy  Patient Location: PACU  Anesthesia Type:General  Level of Consciousness: awake, alert , oriented and patient cooperative  Airway & Oxygen Therapy: Patient Spontanous Breathing and Patient connected to nasal cannula oxygen  Post-op Assessment: Report given to RN, Post -op Vital signs reviewed and stable and Patient moving all extremities  Post vital signs: Reviewed and stable  Last Vitals:  Filed Vitals:   06/11/14 1825  BP:   Pulse: 78  Temp: 36.7 C  Resp:     Complications: No apparent anesthesia complications

## 2014-06-11 NOTE — Anesthesia Postprocedure Evaluation (Signed)
  Anesthesia Post-op Note  Patient: Karen Mercer  Procedure(s) Performed: Procedure(s) with comments: Right L4-5 Lumbar diskectomy (Right) - Right L4-5 Lumbar diskectomy  Patient Location: PACU  Anesthesia Type:General  Level of Consciousness: awake  Airway and Oxygen Therapy: Patient Spontanous Breathing  Post-op Pain: mild  Post-op Assessment: Post-op Vital signs reviewed  Post-op Vital Signs: Reviewed  Last Vitals:  Filed Vitals:   06/11/14 1900  BP: 122/65  Pulse: 47  Temp:   Resp: 29    Complications: No apparent anesthesia complications

## 2014-06-11 NOTE — Anesthesia Preprocedure Evaluation (Signed)
Anesthesia Evaluation  Patient identified by MRN, date of birth, ID band Patient awake    Reviewed: Allergy & Precautions, NPO status   History of Anesthesia Complications Negative for: history of anesthetic complications  Airway Mallampati: I  TM Distance: >3 FB Neck ROM: Full    Dental  (+) Teeth Intact   Pulmonary Current Smoker,    Pulmonary exam normal       Cardiovascular negative cardio ROS  Rhythm:Regular     Neuro/Psych negative neurological ROS  negative psych ROS   GI/Hepatic negative GI ROS, Neg liver ROS,   Endo/Other  negative endocrine ROS  Renal/GU negative Renal ROS     Musculoskeletal negative musculoskeletal ROS (+)   Abdominal Normal abdominal exam  (+)   Peds  Hematology negative hematology ROS (+)   Anesthesia Other Findings   Reproductive/Obstetrics negative OB ROS                             Anesthesia Physical Anesthesia Plan  ASA: II  Anesthesia Plan: General   Post-op Pain Management:    Induction: Intravenous  Airway Management Planned: Oral ETT  Additional Equipment:   Intra-op Plan:   Post-operative Plan: Extubation in OR  Informed Consent: I have reviewed the patients History and Physical, chart, labs and discussed the procedure including the risks, benefits and alternatives for the proposed anesthesia with the patient or authorized representative who has indicated his/her understanding and acceptance.   Dental advisory given  Plan Discussed with:   Anesthesia Plan Comments:         Anesthesia Quick Evaluation

## 2014-06-11 NOTE — Anesthesia Procedure Notes (Signed)
Procedure Name: Intubation Date/Time: 06/11/2014 4:48 PM Performed by: Izora Gala Pre-anesthesia Checklist: Patient identified, Emergency Drugs available, Suction available and Patient being monitored Patient Re-evaluated:Patient Re-evaluated prior to inductionOxygen Delivery Method: Circle system utilized Preoxygenation: Pre-oxygenation with 100% oxygen Intubation Type: IV induction Ventilation: Mask ventilation without difficulty Laryngoscope Size: Miller and 3 Grade View: Grade I Tube type: Oral Tube size: 7.5 mm Number of attempts: 1 Airway Equipment and Method: Stylet and Bite block

## 2014-06-12 NOTE — Progress Notes (Signed)
I received this patient from PACU around 8pm, with a bedside report.  Patient was in grimacing pain, exacerbated by movement and medicated immediately.  Vital signs were good; patient family present; honeycomb dressing was showing bloody red drainage, so I reinforced it with abdominal pad and tape.  Patient placed on 2L of oxygen and PCA set up shortly thereafter.

## 2014-06-12 NOTE — Progress Notes (Signed)
OT Cancellation Note  Patient Details Name: Karen Mercer MRN: [id number] DOB: 06-12-1970   Cancelled Treatment:    Reason Eval/Treat Not Completed: Patient not medically ready. Per MD notes, patient on bedrest today due to severe headache when upright. Will return tomorrow to initiate OT evaluation as appropriate.  Villa Herb M   Cyndie Chime, OTR/L Occupational Therapist [phone] (pager)  06/12/2014, 12:06 PM

## 2014-06-12 NOTE — Progress Notes (Addendum)
Patient with large amount sero-sanguinous drainage and new onset severe headache. Patient medicated and instructed to lay flat until MD assessment. Charge nurse aware.  MD paged. MD agrees for patient to lay flat until MD rounds to assess.

## 2014-06-12 NOTE — Progress Notes (Signed)
Subjective: Patient reports severe headache when up  Objective: Vital signs in last 24 hours: Temp:  [97.9 F (36.6 C)-99 F (37.2 C)] 98.1 F (36.7 C) (02/06 0500) Pulse Rate:  [45-78] 65 (02/06 0500) Resp:  [11-39] 13 (02/06 0800) BP: (115-172)/(54-90) 123/69 mmHg (02/06 0500) SpO2:  [100 %] 100 % (02/06 0800) Weight:  [70.444 kg (155 lb 4.8 oz)] 70.444 kg (155 lb 4.8 oz) (02/05 1155)  Intake/Output from previous day: 02/05 0701 - 02/06 0700 In: 1500 [I.V.:1500] Out: -  Intake/Output this shift:    Physical Exam: Significant amount of bloody drainage from wound.  Bandage has been changed twice and I changed again.  No site of active bleeding appreciated. Patient's headache improved with recumbency.  Legs feel OK and strength in lower extremities is good.  Lab Results: No results for input(s): WBC, HGB, HCT, PLT in the last 72 hours. BMET No results for input(s): NA, K, CL, CO2, GLUCOSE, BUN, CREATININE, CALCIUM in the last 72 hours.  Studies/Results: No results found.  Assessment/Plan: Continue PCA, bedrest today, will attempt to mobilize later today if patient feeling better and wound drainage has decreased.    LOS: 1 day    Peggyann Shoals, MD 06/12/2014, 9:15 AM

## 2014-06-12 NOTE — Progress Notes (Signed)
PT Cancellation Note  Patient Details Name: Karen Mercer MRN: [id number] DOB: 1971/03/01   Cancelled Treatment:    Reason Eval/Treat Not Completed: Medical issues which prohibited therapy.  Per MD notes, patient on bedrest today due to severe headache when upright.  Will return tomorrow to initiate PT evaluation as appropriate.   Despina Pole 06/12/2014, 10:18 AM Carita Pian. Sanjuana Kava, [facility] Acute Rehab Services Pager [phone]'

## 2014-06-13 MED ORDER — OXYCODONE-ACETAMINOPHEN 5-325 MG PO TABS: 2.0000 | ORAL_TABLET | ORAL | Status: DC | PRN

## 2014-06-13 MED ORDER — DIAZEPAM 5 MG PO TABS: 5.0000 mg | ORAL_TABLET | Freq: Four times a day (QID) | ORAL | Status: DC | PRN

## 2014-06-13 NOTE — Progress Notes (Signed)
Patient Karen Mercer home via car with family.  DC instructions and prescriptions given to patient and fully understood.  Vital signs and assessments were stable.

## 2014-06-13 NOTE — Evaluation (Signed)
Occupational Therapy Evaluation Patient Details Name: Karen Mercer MRN: [id number] DOB: 18-May-1970 Today's Date: 06/13/2014    History of Present Illness Pt is a 44 y.o. Female s/p lumbar laminectomy/decompression microdiscectomy 1 level on 06/11/14. Pt was on flat bedrest due to severe HA following surgery.    Clinical Impression   PTA pt lived at home and was independent with ADLs. Pt currently requires Supervision for functional mobility and ADLs and all education and training completed for back precautions and incorporating into ADLs. No further acute OT needs.      Follow Up Recommendations  No OT follow up;Supervision/Assistance - 24 hour    Equipment Recommendations  None recommended by OT    Recommendations for Other Services       Precautions / Restrictions Precautions Precautions: Back Precaution Booklet Issued: Yes (comment) Precaution Comments: Educated pt on 3/3 back precautions and incorporating into ADLs.  Restrictions Weight Bearing Restrictions: No      Mobility Bed Mobility               General bed mobility comments: Pt sitting EOB when OT arrived. Reviewed log roll technique.   Transfers Overall transfer level: Modified independent                    Balance Overall balance assessment: No apparent balance deficits (not formally assessed)                                          ADL Overall ADL's : Needs assistance/impaired                                       General ADL Comments: Pt required Supervision for ADLs and functional mobility. Pt dressed to prepare for d/c and required Supervision for back precautions. Educated pt on incorporating precautions into ADLs.                Pertinent Vitals/Pain Pain Assessment: No/denies pain     Hand Dominance Right   Extremity/Trunk Assessment Upper Extremity Assessment Upper Extremity Assessment: Overall WFL for tasks assessed   Lower  Extremity Assessment Lower Extremity Assessment: Overall WFL for tasks assessed   Cervical / Trunk Assessment Cervical / Trunk Assessment: Normal   Communication Communication Communication: No difficulties   Cognition Arousal/Alertness: Awake/alert Behavior During Therapy: WFL for tasks assessed/performed Overall Cognitive Status: Within Functional Limits for tasks assessed                                Home Living Family/patient expects to be discharged to:: Private residence Living Arrangements: Spouse/significant other;Other (Comment) (may stay with her Aunt) Available Help at Discharge: Family;Available 24 hours/day Type of Home: House Home Access: Level entry     Home Layout: One level     Bathroom Shower/Tub: Teacher, early years/pre: Standard     Home Equipment: None          Prior Functioning/Environment Level of Independence: Independent        Comments: Pt works at [employer]    OT Diagnosis: Generalized weakness;Acute pain    End of Session  Activity Tolerance: Patient tolerated treatment well Patient left: with call bell/phone within reach;Other (comment);with family/visitor present (  sitting EOB)   Time: 1950-9326 OT Time Calculation (min): 11 min Charges:  OT General Charges $OT Visit: 1 Procedure OT Evaluation $Initial OT Evaluation Tier I: 1 Procedure G-Codes:    Juluis Rainier 01-Jul-2014, 11:52 AM  Cyndie Chime, OTR/L Occupational Therapist [phone] (pager)

## 2014-06-13 NOTE — Discharge Summary (Signed)
Physician Discharge Summary  Patient ID: Karen Mercer MRN: [id number] DOB/AGE: [birth year] 42 y.o.  Admit date: 06/11/2014 Discharge date: 06/13/2014  Admission Diagnoses: Lumbar spondylosis, recurrent herniated nucleus pulposus, L4-5 right  Discharge Diagnoses: Lumbar spondylosis, recurrent herniated nucleus pulposus, L4-5 right Active Problems:   Lumbar herniated disc   Discharged Condition: good  Hospital Course: Patient was admitted to undergo surgical decompression at L4-5 right. Postoperatively she had some significant pain and headache and some drainage from the wound. This is subsequently ceased. She is discharged home  Consults: None  Significant Diagnostic Studies: None  Treatments: surgery: L4-5 recurrent herniated nucleus pulposus resection  Discharge Exam: Blood pressure 109/68, pulse 66, temperature 99.9 F (37.7 C), temperature source Oral, resp. rate 16, height 5' 3"  (1.6 m), weight 70.444 kg (155 lb 4.8 oz), last menstrual period 04/25/2014, SpO2 100 %. Incision is clean and dry motor function is intact  Disposition: 01-Home or Self Care  Discharge Instructions    Call MD for:  redness, tenderness, or signs of infection (pain, swelling, redness, odor or green/yellow discharge around incision site)    Complete by:  As directed      Call MD for:  severe uncontrolled pain    Complete by:  As directed      Call MD for:  temperature >100.4    Complete by:  As directed      Diet - low sodium heart healthy    Complete by:  As directed      Increase activity slowly    Complete by:  As directed             Medication List    TAKE these medications        diazepam 5 MG tablet  Commonly known as:  VALIUM  Take 1 tablet (5 mg total) by mouth every 6 (six) hours as needed for muscle spasms.     gabapentin 100 MG capsule  Commonly known as:  NEURONTIN  Take 1 capsule (100 mg total) by mouth 3 (three) times daily.     ibuprofen 800 MG tablet  Commonly known  as:  ADVIL,MOTRIN  Take 800 mg by mouth every 8 (eight) hours as needed.     ibuprofen 800 MG tablet  Commonly known as:  ADVIL,MOTRIN  Take 1 tablet (800 mg total) by mouth 3 (three) times daily.     methylPREDNIsolone 4 MG tablet  Commonly known as:  MEDROL DOSPACK  follow package directions     Oxycodone HCl 10 MG Tabs  Take 10 mg by mouth every 4 (four) hours as needed (pain).     oxyCODONE-acetaminophen 5-325 MG per tablet  Commonly known as:  PERCOCET/ROXICET  Take 2 tablets by mouth every 4 (four) hours as needed for severe pain.     oxyCODONE-acetaminophen 7.5-325 MG per tablet  Commonly known as:  PERCOCET  Take 1 tablet by mouth every 4 (four) hours as needed for pain.     oxyCODONE-acetaminophen 5-325 MG per tablet  Commonly known as:  PERCOCET/ROXICET  Take 2 tablets by mouth every 4 (four) hours as needed for moderate pain.     predniSONE 10 MG tablet  Commonly known as:  STERAPRED UNI-PAK  As directed         Signed: Nakeyia Menden J 06/13/2014, 11:30 AM

## 2014-06-14 ENCOUNTER — Encounter (HOSPITAL_COMMUNITY): Payer: Self-pay | Admitting: Neurosurgery

## 2014-06-14 NOTE — Op Note (Signed)
Karen Mercer, DEVINCENT NO.:  [id number]  MEDICAL RECORD NO.:  [id number]  LOCATION:                                 FACILITY:  PHYSICIAN:  Leeroy Cha, M.D.   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  PROCEDURE:  Right L4-L5 diskectomy, lysis of adhesion, and decompression of the L4 and L5 nerve root.  Microscope.  SURGEON:  Leeroy Cha, M.D.  ASSISTANT:  Earleen Newport, M.D.  CLINICAL HISTORY:  Mr. Feagan is a lady, who underwent surgery at L4-L5 several months  ago.  The pain was mostly in the right side.  She did well, but lately she had been complaining of back pain worsened to the right leg.  Getting worse with conservative treatment.  X-ray showed degenerative disk disease with now in the foramina canal, right worse than the left one with recurrent herniated disk plus fibrosis.  The patient at the moment denies any pain in the left leg.  She want to go ahead with surgery.  She knows that there is a high possibility that she might require further surgery including fusion.  DESCRIPTION OF PROCEDURE:  The patient was taken to the OR, and after intubation, she was positioned on prone manner.  The back was cleaned with DuraPrep and drapes were applied.  Incision for removing the previous scar was made through the skin, subcutaneous tissue, straight down to the fascia.  Retraction was done.  The patient had quite a bit of fibrosis.  Lysis was accomplished.  With the help of the microscope, we localized the lower lamina of L4 and the upper of L5.  Using the 1 and 2 mm Kerrison punch, as well as the drill, we did more laminotomy and partial facetectomy.  The patient had a lot of adhesion  between the dura and the eschar and lysis was accomplished.  We were able to push away the L5 nerve root after we did the lysis, and we entered the disk space, where the patient had quite a bit of degenerative disk disease with practice.  Decompression of the thecal sac as well as the L5 nerve was done.  Valsalva maneuver was done twice up to 40 and was negative. We used surgical glue just to prevent full scar tissue.  Then, the area was irrigated and closed with several layers of Vicryl and Steri-Strips.          ______________________________ Leeroy Cha, M.D.    EB/MEDQ  D:  06/11/2014  T:  06/12/2014  Job:  [id number]

## 2014-06-15 NOTE — Progress Notes (Signed)
UR complete.  Beryle Zeitz RN, MSN 

## 2014-08-02 ENCOUNTER — Telehealth: Payer: Self-pay | Admitting: Orthopedic Surgery

## 2014-08-02 NOTE — Telephone Encounter (Signed)
Patient had called initially to inquire about a possible appointment for swelling and "fluid" of left knee.  Relayed to patient that we can schedule upon Dr Ruthe Mannan return to office in 1 week; however, patient did not feel she could wait until then, and stated she has recently had a second surgery on her back, in February per [place] neurosurgeon, so she will contact their office for advice.   Patient called a short while later, just after 12:00 noon, to relay that her neurosurgeon has advised to contact the after hours orthopedic and sports medicine office in [place], which she has done, and she is scheduled there tomorrow, 08/03/14, 5:30p.m; will request a copy of report to be sent to Dr Aline Brochure.

## 2014-08-09 ENCOUNTER — Other Ambulatory Visit: Payer: Self-pay | Admitting: Orthopedic Surgery

## 2014-08-09 ENCOUNTER — Ambulatory Visit (INDEPENDENT_AMBULATORY_CARE_PROVIDER_SITE_OTHER): Payer: 59

## 2014-08-09 ENCOUNTER — Ambulatory Visit (INDEPENDENT_AMBULATORY_CARE_PROVIDER_SITE_OTHER): Payer: 59 | Admitting: Orthopedic Surgery

## 2014-08-09 ENCOUNTER — Encounter: Payer: Self-pay | Admitting: Orthopedic Surgery

## 2014-08-09 VITALS — BP 149/99 | Ht 63.0 in | Wt 155.0 lb

## 2014-08-09 DIAGNOSIS — M25561 Pain in right knee: Secondary | ICD-10-CM | POA: Diagnosis not present

## 2014-08-09 DIAGNOSIS — M25462 Effusion, left knee: Secondary | ICD-10-CM

## 2014-08-09 NOTE — Progress Notes (Signed)
Established patient progress note new problem  Chief Complaint  Patient presents with  . Follow-up    Recheck on left knee swelling.   This patient had back surgery on 06/11/2014 after back procedure in November for ongoing problems with her back and her disks. She comes in complaining of left knee swelling times proximally 4 weeks no trauma. She complains of pain in the left knee as well. She complains of no instability and does not complain of catching locking.  Review of systems there's been no fever and no warmth or redness to the joint no rashes or insect bites noted  Vitals stable as recorded  Patient's awake alert and oriented 3 mood and affect are normal  Overall appearance shows ectomorphic body habitus  She is ambulatory with a flexed knee posture and a flexion posture of the lumbopelvic junction  She is a large knee joint effusion joint lines are nontender knee flexion passively is normal actively shows limited flexion at 120. Knee and patella remains stable with mild subluxation of both left patella. Motor exam is normal scans intact skin incisions are clean dry and intact. The opposite knee shows no tenderness but mild effusion very mild effusion. Knee otherwise stable  Today's x-rays show intact hardware from the Fulkerson procedure minimal degenerative changes  Impression knee joint effusion.  We aspirated 75 mL of fluid with a lot of cartilage particles  We injected Depo-Medrol 40 with lidocaine 1%  I suspect she has ongoing cartilage degeneration she will need an arthroscopic procedure at some point but I would not do it until her lumbar situation is stabilized  Procedure note injection and aspiration left knee joint  Verbal consent was obtained to aspirate and inject the left knee joint   Timeout was completed to confirm the site of aspiration and injection  An 18-gauge needle was used to aspirate the left knee joint from a suprapatellar lateral  approach.  The medications used were 40 mg of Depo-Medrol and 1% lidocaine 3 cc  Anesthesia was provided by ethyl chloride and the skin was prepped with alcohol.  After cleaning the skin with alcohol an 18-gauge needle was used to aspirate the right knee joint.  We obtained 75 cc of fluid  We follow this by injection of 40 mg of Depo-Medrol and 3 cc 1% lidocaine.  There were no complications. A sterile bandage was applied.

## 2014-08-31 ENCOUNTER — Other Ambulatory Visit (HOSPITAL_COMMUNITY): Payer: Self-pay | Admitting: Neurosurgery

## 2014-08-31 DIAGNOSIS — M5126 Other intervertebral disc displacement, lumbar region: Secondary | ICD-10-CM

## 2014-09-06 ENCOUNTER — Ambulatory Visit (HOSPITAL_COMMUNITY)
Admission: RE | Admit: 2014-09-06 | Discharge: 2014-09-06 | Disposition: A | Discharge status: Home / Self Care | Payer: 59 | Source: Ambulatory Visit | Attending: Neurosurgery | Admitting: Neurosurgery

## 2014-09-06 DIAGNOSIS — M5126 Other intervertebral disc displacement, lumbar region: Secondary | ICD-10-CM | POA: Insufficient documentation

## 2014-09-06 DIAGNOSIS — M545 Low back pain: Secondary | ICD-10-CM | POA: Diagnosis present

## 2014-09-06 MED ORDER — GADOBENATE DIMEGLUMINE 529 MG/ML IV SOLN
13.0000 mL | Freq: Once | INTRAVENOUS | Status: AC | PRN
  Administered 2014-09-06: 13 mL via INTRAVENOUS

## 2014-09-07 ENCOUNTER — Encounter: Payer: Self-pay | Admitting: Orthopedic Surgery

## 2014-09-07 ENCOUNTER — Ambulatory Visit: Payer: 59 | Admitting: Orthopedic Surgery

## 2014-09-10 ENCOUNTER — Other Ambulatory Visit: Payer: Self-pay | Admitting: Neurosurgery

## 2014-09-15 NOTE — Pre-Procedure Instructions (Signed)
Karen Mercer  09/15/2014   Your procedure is scheduled on:  Fri, May 13 @ 8:30 AM  Report to [facility]  at 6:30 AM.  Call this number if you have problems the morning of surgery: [phone]   Remember:   Do not eat food or drink liquids after midnight.   Take these medicines the morning of surgery with A SIP OF WATER: Diazepam(Valium),Gabapentin(Neurontin),and Pain Pill(if needed)              Stop taking your Ibuprofen. No Goody's,BC's,Aleve,Aspirin,Fish Oil,or any Herbal Medications.    Do not wear jewelry, make-up or nail polish.  Do not wear lotions, powders, or perfumes. You may wear deodorant.  Do not shave 48 hours prior to surgery.   Do not bring valuables to the hospital.  [facility] is not responsible                  for any belongings or valuables.               Contacts, dentures or bridgework may not be worn into surgery.  Leave suitcase in the car. After surgery it may be brought to your room.  For patients admitted to the hospital, discharge time is determined by your                treatment team.              [facility] - Preparing for Surgery  Before surgery, you can play an important role.  Because skin is not sterile, your skin needs to be as free of germs as possible.  You can reduce the number of germs on you skin by washing with CHG (chlorahexidine gluconate) soap before surgery.  CHG is an antiseptic cleaner which kills germs and bonds with the skin to continue killing germs even after washing.  Please DO NOT use if you have an allergy to CHG or antibacterial soaps.  If your skin becomes reddened/irritated stop using the CHG and inform your nurse when you arrive at Short Stay.  Do not shave (including legs and underarms) for at least 48 hours prior to the first CHG shower.  You may shave your face.  Please follow these instructions carefully:   1.  Shower with CHG Soap the night before surgery and the                                morning  of Surgery.  2.  If you choose to wash your hair, wash your hair first as usual with your       normal shampoo.  3.  After you shampoo, rinse your hair and body thoroughly to remove the                      Shampoo.  4.  Use CHG as you would any other liquid soap.  You can apply chg directly       to the skin and wash gently with scrungie or a clean washcloth.  5.  Apply the CHG Soap to your body ONLY FROM THE NECK DOWN.        Do not use on open wounds or open sores.  Avoid contact with your eyes,       ears, mouth and genitals (private parts).  Wash genitals (private parts)       with your normal soap.  6.  Wash thoroughly, paying special attention to the area where your surgery        will be performed.  7.  Thoroughly rinse your body with warm water from the neck down.  8.  DO NOT shower/wash with your normal soap after using and rinsing off       the CHG Soap.  9.  Pat yourself dry with a clean towel.            10.  Wear clean pajamas.            11.  Place clean sheets on your bed the night of your first shower and do not        sleep with pets.  Day of Surgery  Do not apply any lotions/deoderants the morning of surgery.  Please wear clean clothes to the hospital/surgery center.      Please read over the following fact sheets that you were given: Pain Booklet, Coughing and Deep Breathing, Blood Transfusion Information, MRSA Information and Surgical Site Infection Prevention

## 2014-09-16 ENCOUNTER — Inpatient Hospital Stay (HOSPITAL_COMMUNITY)
Admission: RE | Admit: 2014-09-16 | Discharge: 2014-09-16 | Disposition: A | Discharge status: Home / Self Care | Payer: 59 | Source: Ambulatory Visit

## 2014-09-16 ENCOUNTER — Encounter (HOSPITAL_COMMUNITY): Payer: Self-pay | Admitting: *Deleted

## 2014-09-16 ENCOUNTER — Encounter (HOSPITAL_COMMUNITY): Payer: Self-pay

## 2014-09-16 HISTORY — DX: Other muscle spasm: M62.838

## 2014-09-16 NOTE — Progress Notes (Signed)
Pt doesn't have a cardiologist  Denies ever having an echo/stress test/heart cath  Medical Md is Dr.Fanta  Denies CXR and EKG in past yr

## 2014-09-21 MED ORDER — CEFAZOLIN SODIUM-DEXTROSE 2-3 GM-% IV SOLR
2.0000 g | INTRAVENOUS | Status: AC
  Administered 2014-09-22: 2 g via INTRAVENOUS
  Filled 2014-09-21: qty 50

## 2014-09-21 MED ORDER — MUPIROCIN 2 % EX OINT
1.0000 "application " | TOPICAL_OINTMENT | Freq: Once | CUTANEOUS | Status: AC
  Administered 2014-09-22: 1 via TOPICAL
  Filled 2014-09-21: qty 22

## 2014-09-21 NOTE — H&P (Signed)
Karen Mercer is an 44 y.o. female.   Chief Complaint: lumbar pain HPI: patient who had two lumbar interventions at the level of l4=5  Right with no relief of the pain and now more back pain with radiation to both legs, unable to walk, sit or stand. She has failed with every conservative treatment including epidurals.  Past Medical History  Diagnosis Date  . Complication of anesthesia     had extreme headache after last back surgery - was in pacu for extended period  . Neuromuscular disorder   . Muscle spasm     takes Valium daily as needed  . PONV (postoperative nausea and vomiting)   . Chronic back pain     both legs;DDD and stenosis   . Rheumatoid arthritis     Past Surgical History  Procedure Laterality Date  . Right arm  2008    rods from fractured arm  . Knee surgery  2012    right knee-arthroscopy  . Knee arthroscopy  08/17/2011    Procedure: ARTHROSCOPY KNEE;  Surgeon: Carole Civil, MD;  Location: [facility];  Service: Orthopedics;  Laterality: Right;  with tibia tubercle transfer and proximal realignment  . Knee arthroscopy with fulkerson slide Left 08/29/2012    Procedure: Arthroscopic lateral release with chondroplasty left knee;  Surgeon: Carole Civil, MD;  Location: [facility];  Service: Orthopedics;  Laterality: Left;  . Knee arthroscopy with medial patellar femoral ligament reconstruction Left 08/29/2012    Procedure: Open proximal realignment fulkerson;  Surgeon: Carole Civil, MD;  Location: [facility];  Service: Orthopedics;  Laterality: Left;  . Lumbar laminectomy/decompression microdiscectomy Right 06/11/2014    Procedure: Right L4-5 Lumbar diskectomy;  Surgeon: Floyce Stakes, MD;  Location: [facility];  Service: Neurosurgery;  Laterality: Right;  Right L4-5 Lumbar diskectomy  . Tubal ligation  1995  . Back surgery      at surgical center    Family History  Problem Relation Age of Onset  . Arthritis      family history    Social History:  reports  that she has been smoking Cigarettes.  She has a 30 pack-year smoking history. She does not have any smokeless tobacco history on file. She reports that she drinks alcohol. She reports that she does not use illicit drugs.  Allergies:  Allergies  Allergen Reactions  . Tramadol Itching    Nausea     No prescriptions prior to admission    No results found for this or any previous visit (from the past 48 hour(s)). No results found.  Review of Systems  Constitutional: Negative.   HENT: Negative.   Eyes: Negative.   Respiratory: Negative.   Cardiovascular: Negative.   Gastrointestinal: Negative.   Genitourinary: Positive for dysuria.  Musculoskeletal: Positive for back pain.  Skin: Negative.   Neurological: Positive for sensory change.  Endo/Heme/Allergies: Negative.   Psychiatric/Behavioral: Negative.     Height 5' 4"  (1.626 m), weight 66.679 kg (147 lb), last menstrual period 09/09/2014. Physical Exam patient who came limping from the right leg. Hent, nl. Neck, nl. Cv, nl. Lungs, clear. Abdomen, soft. Extremities, nl. NEURO  Weakness of dorsiflexion right foot. SLR positive at 30 degrees. Mri shows severe arthropathy at l4-5, with ddd  Assessment/Plan Patient to go ahead with decompression and fusion at l4-5. She is aware of risks and benefits  Julias Mould M 09/21/2014, 2:58 PM

## 2014-09-22 ENCOUNTER — Inpatient Hospital Stay (HOSPITAL_COMMUNITY)
Admission: RE | Admit: 2014-09-22 | Discharge: 2014-09-26 | DRG: 460 | Disposition: A | Discharge status: Home / Self Care | Payer: 59 | Source: Ambulatory Visit | Attending: Neurosurgery | Admitting: Neurosurgery

## 2014-09-22 ENCOUNTER — Encounter (HOSPITAL_COMMUNITY): Payer: Self-pay

## 2014-09-22 ENCOUNTER — Inpatient Hospital Stay (HOSPITAL_COMMUNITY): Payer: 59

## 2014-09-22 ENCOUNTER — Inpatient Hospital Stay (HOSPITAL_COMMUNITY): Payer: 59 | Admitting: Certified Registered Nurse Anesthetist

## 2014-09-22 ENCOUNTER — Encounter (HOSPITAL_COMMUNITY)
Admission: RE | Disposition: A | Discharge status: Home / Self Care | Payer: Self-pay | Source: Ambulatory Visit | Attending: Neurosurgery

## 2014-09-22 DIAGNOSIS — Z8261 Family history of arthritis: Secondary | ICD-10-CM

## 2014-09-22 DIAGNOSIS — F17210 Nicotine dependence, cigarettes, uncomplicated: Secondary | ICD-10-CM | POA: Diagnosis present

## 2014-09-22 DIAGNOSIS — M069 Rheumatoid arthritis, unspecified: Secondary | ICD-10-CM | POA: Diagnosis present

## 2014-09-22 DIAGNOSIS — M5136 Other intervertebral disc degeneration, lumbar region: Secondary | ICD-10-CM | POA: Diagnosis present

## 2014-09-22 DIAGNOSIS — M4326 Fusion of spine, lumbar region: Secondary | ICD-10-CM

## 2014-09-22 DIAGNOSIS — M62838 Other muscle spasm: Secondary | ICD-10-CM | POA: Diagnosis present

## 2014-09-22 DIAGNOSIS — M7138 Other bursal cyst, other site: Secondary | ICD-10-CM | POA: Diagnosis present

## 2014-09-22 DIAGNOSIS — M545 Low back pain: Secondary | ICD-10-CM | POA: Diagnosis present

## 2014-09-22 HISTORY — DX: Other specified postprocedural states: R11.2

## 2014-09-22 HISTORY — DX: Rheumatoid arthritis, unspecified: M06.9

## 2014-09-22 HISTORY — DX: Dorsalgia, unspecified: M54.9

## 2014-09-22 HISTORY — DX: Other chronic pain: G89.29

## 2014-09-22 HISTORY — DX: Other specified postprocedural states: Z98.890

## 2014-09-22 HISTORY — DX: Nausea with vomiting, unspecified: R11.2

## 2014-09-22 LAB — ABO/RH: ABO/RH(D): O POS

## 2014-09-22 LAB — TYPE AND SCREEN
ABO/RH(D): O POS
ANTIBODY SCREEN: NEGATIVE

## 2014-09-22 LAB — CBC
HCT: 33.8 % — ABNORMAL LOW (ref 36.0–46.0)
HEMOGLOBIN: 11 g/dL — AB (ref 12.0–15.0)
MCH: 28.1 pg (ref 26.0–34.0)
MCHC: 32.5 g/dL (ref 30.0–36.0)
MCV: 86.2 fL (ref 78.0–100.0)
Platelets: 270 10*3/uL (ref 150–400)
RBC: 3.92 MIL/uL (ref 3.87–5.11)
RDW: 17.1 % — AB (ref 11.5–15.5)
WBC: 6.7 10*3/uL (ref 4.0–10.5)

## 2014-09-22 LAB — SURGICAL PCR SCREEN
MRSA, PCR: NEGATIVE
STAPHYLOCOCCUS AUREUS: NEGATIVE

## 2014-09-22 LAB — HCG, SERUM, QUALITATIVE: PREG SERUM: NEGATIVE

## 2014-09-22 LAB — BASIC METABOLIC PANEL
ANION GAP: 10 (ref 5–15)
BUN: 8 mg/dL (ref 6–20)
CO2: 25 mmol/L (ref 22–32)
CREATININE: 0.71 mg/dL (ref 0.44–1.00)
Calcium: 8.8 mg/dL — ABNORMAL LOW (ref 8.9–10.3)
Chloride: 104 mmol/L (ref 101–111)
GFR calc non Af Amer: >60 mL/min (ref >60)
Glucose, Bld: 89 mg/dL (ref 65–99)
Potassium: 3.5 mmol/L (ref 3.5–5.1)
Sodium: 139 mmol/L (ref 135–145)

## 2014-09-22 SURGERY — POSTERIOR LUMBAR FUSION 1 LEVEL
Anesthesia: General | Site: Back

## 2014-09-22 MED ORDER — FENTANYL CITRATE (PF) 250 MCG/5ML IJ SOLN
INTRAMUSCULAR | Status: AC
  Filled 2014-09-22: qty 5

## 2014-09-22 MED ORDER — NEOSTIGMINE METHYLSULFATE 10 MG/10ML IV SOLN
INTRAVENOUS | Status: DC | PRN
  Administered 2014-09-22: 3 mg via INTRAVENOUS

## 2014-09-22 MED ORDER — PHENYLEPHRINE HCL 10 MG/ML IJ SOLN
INTRAMUSCULAR | Status: DC | PRN
  Administered 2014-09-22: 80 ug via INTRAVENOUS
  Administered 2014-09-22: 40 ug via INTRAVENOUS

## 2014-09-22 MED ORDER — SODIUM CHLORIDE 0.9 % IJ SOLN
3.0000 mL | INTRAMUSCULAR | Status: DC | PRN
  Administered 2014-09-23 (×2): 3 mL via INTRAVENOUS
  Filled 2014-09-22 (×2): qty 3

## 2014-09-22 MED ORDER — ROCURONIUM BROMIDE 100 MG/10ML IV SOLN
INTRAVENOUS | Status: DC | PRN
  Administered 2014-09-22: 10 mg via INTRAVENOUS
  Administered 2014-09-22: 40 mg via INTRAVENOUS

## 2014-09-22 MED ORDER — KETOROLAC TROMETHAMINE 30 MG/ML IJ SOLN: 30.0000 mg | Freq: Once | INTRAMUSCULAR | Status: DC | PRN

## 2014-09-22 MED ORDER — MENTHOL 3 MG MT LOZG: 1.0000 | LOZENGE | OROMUCOSAL | Status: DC | PRN

## 2014-09-22 MED ORDER — GABAPENTIN 100 MG PO CAPS
100.0000 mg | ORAL_CAPSULE | Freq: Three times a day (TID) | ORAL | Status: DC
Start: 2014-09-22 — End: 2014-09-26
  Administered 2014-09-22 – 2014-09-26 (×12): 100 mg via ORAL
  Filled 2014-09-22 (×12): qty 1

## 2014-09-22 MED ORDER — PROMETHAZINE HCL 25 MG/ML IJ SOLN
6.2500 mg | INTRAMUSCULAR | Status: DC | PRN
  Administered 2014-09-22: 6.25 mg via INTRAVENOUS

## 2014-09-22 MED ORDER — DEXAMETHASONE SODIUM PHOSPHATE 10 MG/ML IJ SOLN
INTRAMUSCULAR | Status: DC | PRN
  Administered 2014-09-22: 10 mg via INTRAVENOUS

## 2014-09-22 MED ORDER — ACETAMINOPHEN 650 MG RE SUPP: 650.0000 mg | RECTAL | Status: DC | PRN

## 2014-09-22 MED ORDER — OXYCODONE-ACETAMINOPHEN 5-325 MG PO TABS
1.0000 | ORAL_TABLET | ORAL | Status: DC | PRN
  Administered 2014-09-22 – 2014-09-24 (×10): 2 via ORAL
  Administered 2014-09-24: 1 via ORAL
  Administered 2014-09-25 – 2014-09-26 (×6): 2 via ORAL
  Filled 2014-09-22 (×3): qty 2
  Filled 2014-09-22: qty 1
  Filled 2014-09-22 (×13): qty 2

## 2014-09-22 MED ORDER — PROPOFOL 10 MG/ML IV BOLUS
INTRAVENOUS | Status: DC | PRN
  Administered 2014-09-22: 170 mg via INTRAVENOUS

## 2014-09-22 MED ORDER — PHENOL 1.4 % MT LIQD: 1.0000 | OROMUCOSAL | Status: DC | PRN

## 2014-09-22 MED ORDER — ONDANSETRON HCL 4 MG/2ML IJ SOLN: 4.0000 mg | INTRAMUSCULAR | Status: DC | PRN

## 2014-09-22 MED ORDER — CEFAZOLIN SODIUM 1-5 GM-% IV SOLN
1.0000 g | Freq: Three times a day (TID) | INTRAVENOUS | Status: AC
  Administered 2014-09-22 – 2014-09-23 (×2): 1 g via INTRAVENOUS
  Filled 2014-09-22 (×3): qty 50

## 2014-09-22 MED ORDER — MORPHINE SULFATE (PF) 1 MG/ML IV SOLN
INTRAVENOUS | Status: AC
  Filled 2014-09-22: qty 25

## 2014-09-22 MED ORDER — MIDAZOLAM HCL 2 MG/2ML IJ SOLN
INTRAMUSCULAR | Status: AC
  Filled 2014-09-22: qty 2

## 2014-09-22 MED ORDER — LACTATED RINGERS IV SOLN
INTRAVENOUS | Status: DC | PRN
  Administered 2014-09-22 (×2): via INTRAVENOUS

## 2014-09-22 MED ORDER — DIAZEPAM 5 MG PO TABS
5.0000 mg | ORAL_TABLET | Freq: Four times a day (QID) | ORAL | Status: DC | PRN
  Administered 2014-09-22: 5 mg via ORAL
  Filled 2014-09-22: qty 1

## 2014-09-22 MED ORDER — THROMBIN 20000 UNITS EX SOLR
CUTANEOUS | Status: DC | PRN
  Administered 2014-09-22: 20 mL via TOPICAL

## 2014-09-22 MED ORDER — EPHEDRINE SULFATE 50 MG/ML IJ SOLN
INTRAMUSCULAR | Status: DC | PRN
  Administered 2014-09-22: 5 mg via INTRAVENOUS
  Administered 2014-09-22: 10 mg via INTRAVENOUS

## 2014-09-22 MED ORDER — DIPHENHYDRAMINE HCL 50 MG/ML IJ SOLN: 12.5000 mg | Freq: Four times a day (QID) | INTRAMUSCULAR | Status: DC | PRN

## 2014-09-22 MED ORDER — OXYCODONE HCL 5 MG/5ML PO SOLN: 5.0000 mg | Freq: Once | ORAL | Status: DC | PRN

## 2014-09-22 MED ORDER — VANCOMYCIN HCL 1000 MG IV SOLR
INTRAVENOUS | Status: AC
  Filled 2014-09-22: qty 2000

## 2014-09-22 MED ORDER — ONDANSETRON HCL 4 MG/2ML IJ SOLN
INTRAMUSCULAR | Status: DC | PRN
  Administered 2014-09-22: 4 mg via INTRAVENOUS

## 2014-09-22 MED ORDER — ACETAMINOPHEN 325 MG PO TABS: 650.0000 mg | ORAL_TABLET | ORAL | Status: DC | PRN

## 2014-09-22 MED ORDER — FENTANYL CITRATE (PF) 100 MCG/2ML IJ SOLN
INTRAMUSCULAR | Status: DC | PRN
  Administered 2014-09-22: 50 ug via INTRAVENOUS
  Administered 2014-09-22: 100 ug via INTRAVENOUS
  Administered 2014-09-22 (×2): 50 ug via INTRAVENOUS
  Administered 2014-09-22: 100 ug via INTRAVENOUS
  Administered 2014-09-22: 150 ug via INTRAVENOUS

## 2014-09-22 MED ORDER — ARTIFICIAL TEARS OP OINT
TOPICAL_OINTMENT | OPHTHALMIC | Status: DC | PRN
  Administered 2014-09-22: 1 via OPHTHALMIC

## 2014-09-22 MED ORDER — SODIUM CHLORIDE 0.9 % IJ SOLN: 9.0000 mL | INTRAMUSCULAR | Status: DC | PRN

## 2014-09-22 MED ORDER — SODIUM CHLORIDE 0.9 % IV SOLN: 250.0000 mL | INTRAVENOUS | Status: DC

## 2014-09-22 MED ORDER — NALOXONE HCL 0.4 MG/ML IJ SOLN: 0.4000 mg | INTRAMUSCULAR | Status: DC | PRN

## 2014-09-22 MED ORDER — 0.9 % SODIUM CHLORIDE (POUR BTL) OPTIME
TOPICAL | Status: DC | PRN
  Administered 2014-09-22: 1000 mL

## 2014-09-22 MED ORDER — SODIUM CHLORIDE 0.9 % IJ SOLN
3.0000 mL | Freq: Two times a day (BID) | INTRAMUSCULAR | Status: DC
  Administered 2014-09-23 – 2014-09-25 (×5): 3 mL via INTRAVENOUS

## 2014-09-22 MED ORDER — GLYCOPYRROLATE 0.2 MG/ML IJ SOLN
INTRAMUSCULAR | Status: DC | PRN
  Administered 2014-09-22: 0.4 mg via INTRAVENOUS

## 2014-09-22 MED ORDER — PROPOFOL 10 MG/ML IV BOLUS
INTRAVENOUS | Status: AC
  Filled 2014-09-22: qty 20

## 2014-09-22 MED ORDER — HYDROMORPHONE HCL 1 MG/ML IJ SOLN
INTRAMUSCULAR | Status: AC
  Filled 2014-09-22: qty 1

## 2014-09-22 MED ORDER — ONDANSETRON HCL 4 MG/2ML IJ SOLN: 4.0000 mg | Freq: Four times a day (QID) | INTRAMUSCULAR | Status: DC | PRN

## 2014-09-22 MED ORDER — DIPHENHYDRAMINE HCL 12.5 MG/5ML PO ELIX
12.5000 mg | ORAL_SOLUTION | Freq: Four times a day (QID) | ORAL | Status: DC | PRN
  Administered 2014-09-23: 12.5 mg via ORAL
  Filled 2014-09-22: qty 10

## 2014-09-22 MED ORDER — HYDROMORPHONE HCL 1 MG/ML IJ SOLN
0.2500 mg | INTRAMUSCULAR | Status: DC | PRN
  Administered 2014-09-22 (×4): 0.5 mg via INTRAVENOUS

## 2014-09-22 MED ORDER — VANCOMYCIN HCL 1000 MG IV SOLR
INTRAVENOUS | Status: DC | PRN
  Administered 2014-09-22: 1000 mg

## 2014-09-22 MED ORDER — PROMETHAZINE HCL 25 MG/ML IJ SOLN
INTRAMUSCULAR | Status: AC
Start: 2014-09-22 — End: 2014-09-22
  Filled 2014-09-22: qty 1

## 2014-09-22 MED ORDER — LIDOCAINE HCL (CARDIAC) 20 MG/ML IV SOLN
INTRAVENOUS | Status: DC | PRN
  Administered 2014-09-22: 100 mg via INTRAVENOUS

## 2014-09-22 MED ORDER — ZOLPIDEM TARTRATE 5 MG PO TABS
5.0000 mg | ORAL_TABLET | Freq: Every evening | ORAL | Status: DC | PRN
  Administered 2014-09-24 – 2014-09-25 (×2): 5 mg via ORAL
  Filled 2014-09-22 (×2): qty 1

## 2014-09-22 MED ORDER — OXYCODONE HCL 5 MG PO TABS: 5.0000 mg | ORAL_TABLET | Freq: Once | ORAL | Status: DC | PRN

## 2014-09-22 MED ORDER — MORPHINE SULFATE (PF) 1 MG/ML IV SOLN
INTRAVENOUS | Status: DC
  Administered 2014-09-22: 16:00:00 via INTRAVENOUS
  Administered 2014-09-22: 4.5 mg via INTRAVENOUS
  Administered 2014-09-22 (×2): via INTRAVENOUS
  Administered 2014-09-23: 1 mg via INTRAVENOUS
  Administered 2014-09-23: 13.5 mg via INTRAVENOUS
  Administered 2014-09-23: 18:00:00 via INTRAVENOUS
  Administered 2014-09-23: 13.45 mg via INTRAVENOUS
  Administered 2014-09-23: 11.63 mg via INTRAVENOUS
  Administered 2014-09-23: via INTRAVENOUS
  Administered 2014-09-23: 19.5 mg via INTRAVENOUS
  Administered 2014-09-24: 7.5 mg via INTRAVENOUS
  Administered 2014-09-24: 06:00:00 via INTRAVENOUS
  Filled 2014-09-22 (×6): qty 25

## 2014-09-22 MED ORDER — BUPIVACAINE LIPOSOME 1.3 % IJ SUSP
INTRAMUSCULAR | Status: DC | PRN
  Administered 2014-09-22: 20 mL

## 2014-09-22 MED ORDER — BUPIVACAINE LIPOSOME 1.3 % IJ SUSP
20.0000 mL | INTRAMUSCULAR | Status: AC
  Filled 2014-09-22: qty 20

## 2014-09-22 MED ORDER — POLYETHYLENE GLYCOL 3350 17 G PO PACK: 17.0000 g | PACK | Freq: Every day | ORAL | Status: DC | PRN

## 2014-09-22 MED ORDER — SODIUM CHLORIDE 0.9 % IV SOLN
INTRAVENOUS | Status: DC
  Administered 2014-09-22: 16:00:00 via INTRAVENOUS
  Administered 2014-09-23: 75 mL/h via INTRAVENOUS
  Administered 2014-09-23 – 2014-09-24 (×2): via INTRAVENOUS

## 2014-09-22 MED ORDER — MIDAZOLAM HCL 5 MG/5ML IJ SOLN
INTRAMUSCULAR | Status: DC | PRN
  Administered 2014-09-22: 2 mg via INTRAVENOUS

## 2014-09-22 SURGICAL SUPPLY — 68 items
APL SKNCLS STERI-STRIP NONHPOA (GAUZE/BANDAGES/DRESSINGS) ×1
BENZOIN TINCTURE PRP APPL 2/3 (GAUZE/BANDAGES/DRESSINGS) ×3 IMPLANT
BLADE CLIPPER SURG (BLADE) IMPLANT
BUR ACORN 6.0 (BURR) ×2 IMPLANT
BUR ACORN 6.0MM (BURR) ×1
BUR MATCHSTICK NEURO 3.0 LAGG (BURR) ×3 IMPLANT
CANISTER SUCT 3000ML PPV (MISCELLANEOUS) ×3 IMPLANT
CAP LOCKING THREADED (Cap) ×8 IMPLANT
CLOSURE WOUND 1/2 X4 (GAUZE/BANDAGES/DRESSINGS) ×1
CONT SPEC 4OZ CLIKSEAL STRL BL (MISCELLANEOUS) ×3 IMPLANT
COVER BACK TABLE 60X90IN (DRAPES) ×3 IMPLANT
CROSSLINK SPINAL FUSION (Cage) ×2 IMPLANT
DRAPE C-ARM 42X72 X-RAY (DRAPES) ×6 IMPLANT
DRAPE LAPAROTOMY 100X72X124 (DRAPES) ×3 IMPLANT
DRAPE POUCH INSTRU U-SHP 10X18 (DRAPES) ×3 IMPLANT
DRSG OPSITE POSTOP 4X6 (GAUZE/BANDAGES/DRESSINGS) ×2 IMPLANT
DRSG PAD ABDOMINAL 8X10 ST (GAUZE/BANDAGES/DRESSINGS) IMPLANT
DURAPREP 26ML APPLICATOR (WOUND CARE) ×3 IMPLANT
ELECT REM PT RETURN 9FT ADLT (ELECTROSURGICAL) ×3
ELECTRODE REM PT RTRN 9FT ADLT (ELECTROSURGICAL) ×1 IMPLANT
EVACUATOR 1/8 PVC DRAIN (DRAIN) IMPLANT
GAUZE SPONGE 4X4 12PLY STRL (GAUZE/BANDAGES/DRESSINGS) ×3 IMPLANT
GAUZE SPONGE 4X4 16PLY XRAY LF (GAUZE/BANDAGES/DRESSINGS) ×3 IMPLANT
GLOVE BIOGEL M 8.0 STRL (GLOVE) ×3 IMPLANT
GLOVE EXAM NITRILE LRG STRL (GLOVE) IMPLANT
GLOVE EXAM NITRILE MD LF STRL (GLOVE) IMPLANT
GLOVE EXAM NITRILE XL STR (GLOVE) IMPLANT
GLOVE EXAM NITRILE XS STR PU (GLOVE) IMPLANT
GOWN STRL REUS W/ TWL LRG LVL3 (GOWN DISPOSABLE) ×1 IMPLANT
GOWN STRL REUS W/ TWL XL LVL3 (GOWN DISPOSABLE) IMPLANT
GOWN STRL REUS W/TWL 2XL LVL3 (GOWN DISPOSABLE) IMPLANT
GOWN STRL REUS W/TWL LRG LVL3 (GOWN DISPOSABLE) ×3
GOWN STRL REUS W/TWL XL LVL3 (GOWN DISPOSABLE)
KIT BASIN OR (CUSTOM PROCEDURE TRAY) ×3 IMPLANT
KIT INFUSE MEDIUM (Orthopedic Implant) ×2 IMPLANT
KIT ROOM TURNOVER OR (KITS) ×3 IMPLANT
NDL HYPO 18GX1.5 BLUNT FILL (NEEDLE) IMPLANT
NDL HYPO 21X1.5 SAFETY (NEEDLE) IMPLANT
NDL HYPO 25X1 1.5 SAFETY (NEEDLE) IMPLANT
NEEDLE HYPO 18GX1.5 BLUNT FILL (NEEDLE) IMPLANT
NEEDLE HYPO 21X1.5 SAFETY (NEEDLE) IMPLANT
NEEDLE HYPO 25X1 1.5 SAFETY (NEEDLE) IMPLANT
NS IRRIG 1000ML POUR BTL (IV SOLUTION) ×3 IMPLANT
PACK LAMINECTOMY NEURO (CUSTOM PROCEDURE TRAY) ×3 IMPLANT
PAD ARMBOARD 7.5X6 YLW CONV (MISCELLANEOUS) ×9 IMPLANT
PATTIES SURGICAL .5 X1 (DISPOSABLE) ×3 IMPLANT
PATTIES SURGICAL .5 X3 (DISPOSABLE) IMPLANT
ROD 40MM SPINAL (Rod) ×2 IMPLANT
ROD CREO 45MM SPINAL (Rod) ×2 IMPLANT
SCREW SPINE 40X5.5XPA CREO (Screw) IMPLANT
SCREW SPINE CREO 5.5X40 (Screw) ×12 IMPLANT
SPACER RISE 8X22 8-14MM-10 (Neuro Prosthesis/Implant) ×2 IMPLANT
SPONGE LAP 4X18 X RAY DECT (DISPOSABLE) IMPLANT
SPONGE NEURO XRAY DETECT 1X3 (DISPOSABLE) IMPLANT
SPONGE SURGIFOAM ABS GEL 100 (HEMOSTASIS) ×3 IMPLANT
STRIP CLOSURE SKIN 1/2X4 (GAUZE/BANDAGES/DRESSINGS) ×2 IMPLANT
SUT VIC AB 1 CT1 18XBRD ANBCTR (SUTURE) ×2 IMPLANT
SUT VIC AB 1 CT1 8-18 (SUTURE) ×6
SUT VIC AB 2-0 CP2 18 (SUTURE) ×3 IMPLANT
SUT VIC AB 3-0 SH 8-18 (SUTURE) ×3 IMPLANT
SYR 20CC LL (SYRINGE) IMPLANT
SYR 20ML ECCENTRIC (SYRINGE) ×3 IMPLANT
SYR 5ML LL (SYRINGE) IMPLANT
TAPE STRIPS DRAPE STRL (GAUZE/BANDAGES/DRESSINGS) ×2 IMPLANT
TOWEL OR 17X24 6PK STRL BLUE (TOWEL DISPOSABLE) ×3 IMPLANT
TOWEL OR 17X26 10 PK STRL BLUE (TOWEL DISPOSABLE) ×3 IMPLANT
TRAY FOLEY CATH 14FRSI W/METER (CATHETERS) ×3 IMPLANT
WATER STERILE IRR 1000ML POUR (IV SOLUTION) ×3 IMPLANT

## 2014-09-22 NOTE — Progress Notes (Signed)
PT Cancellation Note  Patient Details Name: Karen Mercer MRN: [id number] DOB: 1971-02-12   Cancelled Treatment:    Reason Eval/Treat Not Completed: Patient not medically ready (no brace available at this time)   Duncan Dull 09/22/2014, 2:47 PM Alben Deeds, [place] DPT  [phone]

## 2014-09-22 NOTE — Anesthesia Postprocedure Evaluation (Signed)
  Anesthesia Post-op Note  Patient: Karen Mercer  Procedure(s) Performed: Procedure(s) with comments: L4-5 Posterior lumbar interbody fusion (N/A) - L4-5 Posterior lumbar interbody fusion  Patient Location: PACU  Anesthesia Type:General  Level of Consciousness: awake and alert   Airway and Oxygen Therapy: Patient Spontanous Breathing  Post-op Pain: mild  Post-op Assessment: Post-op Vital signs reviewed  Post-op Vital Signs: Reviewed  Last Vitals:  Filed Vitals:   09/22/14 1157  BP: 130/83  Pulse: 51  Temp: 36.4 C  Resp:     Complications: No apparent anesthesia complications

## 2014-09-22 NOTE — Progress Notes (Addendum)
Informed Tawni Levy, CRNA of patient's history of MH, PONV and difficult intubation.  Informed Tawni Levy, CRNA that [facility] and difficult intubation were actually under pertinent negatives for anesthesia and patient denies having those, only has PONV.

## 2014-09-22 NOTE — Transfer of Care (Signed)
Immediate Anesthesia Transfer of Care Note  Patient: Karen Mercer  Procedure(s) Performed: Procedure(s) with comments: L4-5 Posterior lumbar interbody fusion (N/A) - L4-5 Posterior lumbar interbody fusion  Patient Location: PACU  Anesthesia Type:General  Level of Consciousness: awake, alert , patient cooperative and confused  Airway & Oxygen Therapy: Patient Spontanous Breathing and Patient connected to nasal cannula oxygen  Post-op Assessment: Report given to RN, Post -op Vital signs reviewed and stable, Patient moving all extremities and Patient moving all extremities X 4  Post vital signs: Reviewed and stable  Last Vitals:  Filed Vitals:   09/22/14 0717  BP: 156/87  Pulse: 49  Temp: 36.7 C  Resp: 20    Complications: No apparent anesthesia complications

## 2014-09-22 NOTE — Anesthesia Procedure Notes (Signed)
Procedure Name: Intubation Date/Time: 09/22/2014 8:31 AM Performed by: Shirlyn Goltz Pre-anesthesia Checklist: Patient identified, Emergency Drugs available, Suction available and Patient being monitored Patient Re-evaluated:Patient Re-evaluated prior to inductionOxygen Delivery Method: Circle system utilized Preoxygenation: Pre-oxygenation with 100% oxygen Intubation Type: IV induction Ventilation: Mask ventilation without difficulty Laryngoscope Size: Mac and 3 Grade View: Grade I Tube type: Oral Tube size: 7.0 mm Number of attempts: 1 Airway Equipment and Method: Stylet Placement Confirmation: ETT inserted through vocal cords under direct vision,  positive ETCO2 and breath sounds checked- equal and bilateral Secured at: 21 cm Tube secured with: Tape Dental Injury: Teeth and Oropharynx as per pre-operative assessment

## 2014-09-22 NOTE — Progress Notes (Signed)
Orthopedic Tech Progress Note Patient Details:  Kennesha Brewbaker Hegeman July 17, 1970 [id number]  Patient ID: Myles Gip, female   DOB: 1970/12/13, 44 y.o.   MRN: [id number] Called in bio-tech brace order; spoke with Ramonita Lab, Deondrae Mcgrail 09/22/2014, 12:35 PM

## 2014-09-22 NOTE — Anesthesia Preprocedure Evaluation (Addendum)
Anesthesia Evaluation  Patient identified by MRN, date of birth, ID band Patient awake    Reviewed: Allergy & Precautions, NPO status , Patient's Chart, lab work & pertinent test results  History of Anesthesia Complications (+) PONV  Airway Mallampati: II  TM Distance: >3 FB Neck ROM: Full    Dental  (+) Teeth Intact, Dental Advisory Given   Pulmonary Current Smoker,  breath sounds clear to auscultation        Cardiovascular negative cardio ROS  Rhythm:Regular Rate:Normal     Neuro/Psych  Neuromuscular disease    GI/Hepatic negative GI ROS, Neg liver ROS,   Endo/Other  negative endocrine ROS  Renal/GU negative Renal ROS     Musculoskeletal negative musculoskeletal ROS (+)   Abdominal   Peds  Hematology  (+) anemia ,   Anesthesia Other Findings   Reproductive/Obstetrics Serum preg negative                             Lab Results  Component Value Date   WBC 6.7 09/22/2014   HGB 11.0* 09/22/2014   HCT 33.8* 09/22/2014   MCV 86.2 09/22/2014   PLT 270 09/22/2014   Lab Results  Component Value Date   CREATININE 0.63 06/04/2014   BUN 7 06/04/2014   NA 138 06/04/2014   K 3.5 06/04/2014   CL 104 06/04/2014   CO2 29 06/04/2014    Anesthesia Physical Anesthesia Plan  ASA: II  Anesthesia Plan: General   Post-op Pain Management:    Induction: Intravenous  Airway Management Planned: Oral ETT  Additional Equipment:   Intra-op Plan:   Post-operative Plan: Extubation in OR  Informed Consent: I have reviewed the patients History and Physical, chart, labs and discussed the procedure including the risks, benefits and alternatives for the proposed anesthesia with the patient or authorized representative who has indicated his/her understanding and acceptance.   Dental advisory given  Plan Discussed with: CRNA  Anesthesia Plan Comments:         Anesthesia Quick  Evaluation

## 2014-09-23 MED ORDER — NICOTINE 14 MG/24HR TD PT24
14.0000 mg | MEDICATED_PATCH | Freq: Every day | TRANSDERMAL | Status: DC
  Administered 2014-09-23 – 2014-09-25 (×3): 14 mg via TRANSDERMAL
  Filled 2014-09-23 (×4): qty 1

## 2014-09-23 NOTE — Progress Notes (Signed)
Patient ID: Karen Mercer, female   DOB: 12-31-1970, 44 y.o.   MRN: [id number] Bloody drainage, c/o incisional pain. No weakness

## 2014-09-23 NOTE — Evaluation (Signed)
Physical Therapy Evaluation Patient Details Name: SHELETHA BOW MRN: [id number] DOB: Jan 19, 1971 Today's Date: 09/23/2014   History of Present Illness  44 yo female s/p PLIF L4-5 PMH: PONV, chronic back pain, RA, R UE rod, R knee surg, L knee x2 surg, back surg  Clinical Impression  Pt moving well despite being drowsy and very painful.  Pt using PCA and visibly becoming more drowsy after each use.  Noted incisional drainage during OT session this am and no drainage noted during PT.  Dressing was clean, dry, and intact.  Feel with continued therapy pt will be able to progress to return home with family support.  Will continue to follow.      Follow Up Recommendations Home health PT;Supervision/Assistance - 24 hour    Equipment Recommendations  Rolling walker with 5" wheels    Recommendations for Other Services       Precautions / Restrictions Precautions Precautions: Back Precaution Booklet Issued: Yes (comment) Precaution Comments: pt able to recall 2/3 back precautions from OT session this am.   Required Braces or Orthoses: Spinal Brace Spinal Brace: Lumbar corset;Applied in sitting position Restrictions Weight Bearing Restrictions: No      Mobility  Bed Mobility Overal bed mobility: Needs Assistance Bed Mobility: Rolling;Sidelying to Sit Rolling: Supervision Sidelying to sit: Supervision       General bed mobility comments: pt demonstrates good return on education provided during OT session.  pt able to perform log roll with use of bed rails and then bring self to sitting without physical A.    Transfers Overall transfer level: Needs assistance Equipment used: Rolling walker (2 wheeled) Transfers: Sit to/from [facility] Sit to Stand: Min assist Stand pivot transfers: Min assist       General transfer comment: cues for UE use and use of RW.  cues for movement through pivot to chair.    Ambulation/Gait                Stairs            Wheelchair Mobility    Modified Rankin (Stroke Patients Only)       Balance Overall balance assessment: Needs assistance Sitting-balance support: No upper extremity supported;Feet supported Sitting balance-Leahy Scale: Fair Sitting balance - Comments: pt able to don brace sitting without support or A.     Standing balance support: Bilateral upper extremity supported;During functional activity Standing balance-Leahy Scale: Poor                               Pertinent Vitals/Pain Pain Assessment: 0-10 Pain Score: 9  Pain Location: Back and Bil LEs.   Pain Descriptors / Indicators: Aching Pain Intervention(s): Monitored during session;Repositioned;PCA encouraged    Home Living Family/patient expects to be discharged to:: Private residence Living Arrangements: Spouse/significant other;Other relatives Available Help at Discharge: Family Type of Home: House Home Access: Level entry     Home Layout: Two level;Able to live on main level with bedroom/bathroom Home Equipment: Kasandra Knudsen - single point      Prior Function Level of Independence: Independent               Hand Dominance   Dominant Hand: Right    Extremity/Trunk Assessment   Upper Extremity Assessment: Defer to OT evaluation           Lower Extremity Assessment: Overall WFL for tasks assessed      Cervical / Trunk Assessment: Normal  Communication   Communication: No difficulties  Cognition Arousal/Alertness: Awake/alert Behavior During Therapy: WFL for tasks assessed/performed Overall Cognitive Status: Within Functional Limits for tasks assessed                      General Comments General comments (skin integrity, edema, etc.): Dressing dry and intact throughout session.      Exercises        Assessment/Plan    PT Assessment Patient needs continued PT services  PT Diagnosis Difficulty walking;Acute pain   PT Problem List Decreased strength;Decreased activity  tolerance;Decreased balance;Decreased mobility;Decreased knowledge of use of DME;Decreased knowledge of precautions;Pain  PT Treatment Interventions DME instruction;Gait training;Functional mobility training;Therapeutic activities;Therapeutic exercise;Balance training;Neuromuscular re-education;Patient/family education   PT Goals (Current goals can be found in the Care Plan section) Acute Rehab PT Goals Patient Stated Goal: to stop having back pain and to go home PT Goal Formulation: With patient Time For Goal Achievement: 09/30/14 Potential to Achieve Goals: Good    Frequency Min 5X/week   Barriers to discharge        Co-evaluation               End of Session Equipment Utilized During Treatment: Gait belt;Back brace Activity Tolerance: Patient limited by pain;Patient limited by lethargy Patient left: in chair;with call bell/phone within reach;with chair alarm set;with family/visitor present Nurse Communication: Mobility status         Time: 8309-4076 PT Time Calculation (min) (ACUTE ONLY): 21 min   Charges:   PT Evaluation $Initial PT Evaluation Tier I: 1 Procedure     PT G CodesCatarina Hartshorn, [place] 09/23/2014, 2:47 PM

## 2014-09-23 NOTE — Evaluation (Signed)
Occupational Therapy Evaluation Patient Details Name: Karen Mercer MRN: [id number] DOB: 1970-11-29 Today's Date: 09/23/2014    History of Present Illness 44 yo female s/p PLIF L4-5 PMH: PONV, chronic back pain, RA, R UE rod, R knee surg, L knee x2 surg, back surg   Clinical Impression   Patient is s/p PLIF L4-L5 surgery resulting in functional limitations due to the deficits listed below (see OT problem list). Education provided on back precautions, bed mobility and aspen brace adjusted from large to medium. Pt ambulated with RW to sink for grooming task, and while standing had leakage from incision site. RN notified and dressing changed upon return to bed. Pt stated dizziness following pain medication, and headache upon standing. Pt. will benefit from skilled OT acutely to increase independence and safety with ADLS to allow discharge home with A.     Follow Up Recommendations  Home health OT    Equipment Recommendations  3 in 1 bedside comode;Other (comment) (RW)    Recommendations for Other Services       Precautions / Restrictions Precautions Precautions: Back Precaution Comments: handout provided and reviewed Required Braces or Orthoses: Spinal Brace Spinal Brace: Lumbar corset;Applied in sitting position      Mobility Bed Mobility Overal bed mobility: Needs Assistance Bed Mobility: Rolling;Sidelying to Sit;Sit to Sidelying Rolling: Mod assist Sidelying to sit: Mod assist     Sit to sidelying: Mod assist General bed mobility comments: Education provided on using legs to push up in bed. Pt. tries to arch in bed to find comfort, education provided on bending knees instead of arching in bed.  Transfers Overall transfer level: Needs assistance Equipment used: Rolling walker (2 wheeled) Transfers: Sit to/from Stand Sit to Stand: Min assist              Balance Overall balance assessment: Needs assistance (guarded, small steps due to pain) Sitting-balance  support: No upper extremity supported;Feet supported                                        ADL Overall ADL's : Needs assistance/impaired Eating/Feeding: Independent   Grooming: Wash/dry hands;Wash/dry face;Oral care;Min guard;Standing;Cueing for safety   Upper Body Bathing: Min guard;Cueing for safety;Sitting   Lower Body Bathing: With adaptive equipment;Cueing for back precautions;Cueing for safety;Sit to/from stand;Adhering to back precautions;Minimal assistance   Upper Body Dressing : Minimal assistance;Cueing for safety;Sitting (don/doff back brace) Upper Body Dressing Details (indicate cue type and reason): cuing for safety, A for don/doff back brace Lower Body Dressing: Minimal assistance;With adaptive equipment;Cueing for back precautions;Sit to/from stand   Toilet Transfer: Minimal assistance;RW;BSC;Ambulation;Cueing for safety   Toileting- Clothing Manipulation and Hygiene: Min guard;Cueing for back precautions;Sit to/from stand       Functional mobility during ADLs: Minimal assistance;Rolling walker;Cueing for safety General ADL Comments: Aspen brace adjusted from large to medium. Education provided on don/doff brace in sitting. Cuing provided for back precautions when grooming at sink.     Vision Vision Assessment?: No apparent visual deficits   Perception     Praxis      Pertinent Vitals/Pain Pain Assessment: 0-10 Pain Score: 10-Worst pain ever Pain Location: back Pain Intervention(s): Monitored during session;Repositioned;Patient requesting pain meds-RN notified;RN gave pain meds during session;PCA encouraged     Hand Dominance Right   Extremity/Trunk Assessment Upper Extremity Assessment Upper Extremity Assessment: Overall WFL for tasks assessed  Communication Communication Communication: No difficulties   Cognition Arousal/Alertness: Awake/alert Behavior During Therapy: WFL for tasks assessed/performed Overall Cognitive  Status: Within Functional Limits for tasks assessed                     General Comments       Exercises       Shoulder Instructions      Home Living Family/patient expects to be discharged to:: Private residence Living Arrangements: Spouse/significant other;Other relatives Available Help at Discharge: Family Type of Home: House Home Access: Level entry     Home Layout: Two level;Able to live on main level with bedroom/bathroom     Bathroom Shower/Tub: Walk-in Hydrologist: Standard     Home Equipment: Cane - single point          Prior Functioning/Environment               OT Diagnosis: Acute pain;Generalized weakness   OT Problem List: Decreased activity tolerance;Impaired balance (sitting and/or standing);Decreased safety awareness;Decreased knowledge of precautions;Pain   OT Treatment/Interventions: Self-care/ADL training;Therapeutic exercise;DME and/or AE instruction;Therapeutic activities;Patient/family education;Balance training    OT Goals(Current goals can be found in the care plan section) Acute Rehab OT Goals Patient Stated Goal: to stop having back pain and to go home OT Goal Formulation: With patient Time For Goal Achievement: 10/07/14 Potential to Achieve Goals: Good  OT Frequency: Min 2X/week   Barriers to D/C:            Co-evaluation              End of Session Equipment Utilized During Treatment: Gait belt;Rolling walker;Back brace Nurse Communication: Mobility status;Precautions  Activity Tolerance: Patient limited by pain Patient left: in bed;with call bell/phone within reach;with bed alarm set;with family/visitor present   Time: 4174-0814 OT Time Calculation (min): 48 min Charges:  OT General Charges $OT Visit: 1 Procedure OT Evaluation $Initial OT Evaluation Tier I: 1 Procedure OT Treatments $Self Care/Home Management : 23-37 mins G-Codes:    Forest Gleason 09/23/2014, 9:26  AM

## 2014-09-23 NOTE — Op Note (Signed)
NAMEDEBANHI, Karen Mercer NO.:  [id number]  MEDICAL RECORD NO.:  [id number]  LOCATION:  [id number]                        FACILITY:  [facility]  PHYSICIAN:  Leeroy Cha, M.D.   DATE OF BIRTH:  [birth year]  DATE OF PROCEDURE:  09/22/2014 DATE OF DISCHARGE:                              OPERATIVE REPORT   PREOPERATIVE DIAGNOSES:  L4-5 degenerative disk disease without radiculopathy bilaterally, right worse than the left one.  Status post two lumbar diskectomies, synovial cyst, L4-5 right.  POSTOPERATIVE DIAGNOSES:  L4-5 degenerative disk disease without radiculopathy bilaterally, right worse than the left one.  Status post two lumbar diskectomies, synovial cyst, L4-5 right.  PROCEDURES:  Bilateral L4 laminectomy and facetectomy.  Lysis of adhesion.  Bilaterally L4-5 diskectomy with insertion of single cage expandable from the left side.  Pedicle screws at L4-L5, posterolateral arthrodesis with BMP and autograft.  Cell Saver.  C-arm.  SURGEON:  Leeroy Cha, M.D.  ASSISTANT:  Ashok Pall, M.D.  CLINICAL HISTORY:  Karen Mercer is a lady who underwent two lumbar diskectomies because of pain going to the right leg.  Lately, she came to my office complaining of bilateral leg pain.  She had failed with every single conservative treatment including pain clinic.  MRI showed degenerative disk disease with a large overgrowth of facet, right worse than the left one with cyst at the L4-5 facet than right side.  Surgery was advised and she and the family knew the risk and benefits.  DESCRIPTION OF PROCEDURE:  The patient was taken to the OR, and after intubation, she was positioned in a prone manner.  The back was cleaned with Betadine and later on with DuraPrep.  Drapes were applied.  Midline incision resecting the previous scar was made from L4 to L5.  Muscles were retracted all the way laterally.  X-rays showed that indeed, we were right at the level of 4-5.  From then on, we  removed the spinous process of L4 and the lamina of L4 bilaterally.  Facetectomy in the left side was done easily, but in the right side, the patient has quite a bit of widening of the facet with a cyst in that area.  Resection of the cyst was accomplished.  We identified the thecal sac and lysis of adhesion was done bilaterally, it was worse in the right side than the left side.  In the left side, we entered the disk space easily, total diskectomy was done laterally and toward the midline.  In the right side, after we did the lysis of adhesion, we entered the disk in the lateral aspect.  It was difficult to retract the thecal sac, and because of that, we decided to proceed with insertion of one single expandable cage from the left side at the level of 4-5.  The cage had BMP and autograft.  The rest of the disk space was filled up with the same material.  Then, we proceeded with investigation of the L4-L5 nerve root and there were plenty of space for both of them.  With the C-arm first in AP view and then a lateral view, we made holes in the pedicle of L4 and L5.  Before insertion of  the screws, we feel the all four quadrants just to be sure that we were surrounded by bone.  Then, four screws of 5.5 x 40 were inserted, kept in place with a rod and Capps.  Cross-link from right to left was done.  Then, we went laterally and the lateral aspect of the facet of 4-5 and the transverse process was cleaned, removing the periosteum.  A mix of BMP and autograft was used for arthrodesis.  From then on, the area was irrigated.  We went back into the operative site just to be sure that there was no compromise of the nerve by these screws.  From then on, the area was irrigated, vancomycin powder was left in the operative site and the wound was closed with different layers of Vicryl and Steri-Strips.          ______________________________ Leeroy Cha, M.D.     EB/MEDQ  D:  09/22/2014  T:   09/23/2014  Job:  [id number]

## 2014-09-24 MED ORDER — BACLOFEN 10 MG PO TABS
10.0000 mg | ORAL_TABLET | Freq: Four times a day (QID) | ORAL | Status: DC
  Administered 2014-09-24 – 2014-09-25 (×5): 10 mg via ORAL
  Filled 2014-09-24 (×5): qty 1

## 2014-09-24 MED ORDER — BISACODYL 10 MG RE SUPP: 10.0000 mg | Freq: Every day | RECTAL | Status: DC | PRN

## 2014-09-24 NOTE — Progress Notes (Signed)
Patient used 102m from 8pm to midnight with 14 pushes/ 14 doses provided

## 2014-09-24 NOTE — Progress Notes (Signed)
Patient ID: Karen Mercer, female   DOB: 1970/09/24, 44 y.o.   MRN: [id number] C/o muscle spasms. No weakness . Morphine not working to control pain

## 2014-09-24 NOTE — Progress Notes (Signed)
Physical Therapy Treatment Patient Details Name: Karen Mercer MRN: [id number] DOB: [birth year] Today's Date: 09/24/2014    History of Present Illness 44 yo female s/p PLIF L4-5 PMH: PONV, chronic back pain, RA, R UE rod, R knee surg, L knee x2 surg, back surg    PT Comments    Pt able to verbalize back precautions and seems to demonstrate good follow through on education provided.  Pt able to ambulate in hallway and discussed ambulating again later with family present, but not alone.  Pt making good progress and will continue to follow while on acute.    Follow Up Recommendations  Home health PT;Supervision/Assistance - 24 hour     Equipment Recommendations  Rolling walker with 5" wheels    Recommendations for Other Services       Precautions / Restrictions Precautions Precautions: Back Precaution Comments: pt able to verbalize 3/3 back precautions.   Required Braces or Orthoses: Spinal Brace Spinal Brace: Lumbar corset;Applied in sitting position Restrictions Weight Bearing Restrictions: No    Mobility  Bed Mobility Overal bed mobility: Modified Independent             General bed mobility comments: pt moves slowly, but demonstrates good technique.    Transfers Overall transfer level: Needs assistance Equipment used: Rolling walker (2 wheeled) Transfers: Sit to/from Stand Sit to Stand: Supervision         General transfer comment: cues for UE use during first transfer, but pt demonstated carry over on each addition transfer.    Ambulation/Gait Ambulation/Gait assistance: Supervision Ambulation Distance (Feet): 300 Feet Assistive device: Rolling walker (2 wheeled) Gait Pattern/deviations: Step-through pattern;Decreased stride length     General Gait Details: pt moves slowly and utilizes RW for support.     Stairs            Wheelchair Mobility    Modified Rankin (Stroke Patients Only)       Balance Overall balance assessment: Needs  assistance         Standing balance support: Single extremity supported;During functional activity Standing balance-Leahy Scale: Poor Standing balance comment: pt keeps single UE on sink during washing face and brushing teeth, more for minimizing pain than actual balance.                      Cognition Arousal/Alertness: Awake/alert Behavior During Therapy: WFL for tasks assessed/performed Overall Cognitive Status: Within Functional Limits for tasks assessed                      Exercises      General Comments        Pertinent Vitals/Pain Pain Assessment: 0-10 Pain Score: 10-Worst pain ever Pain Location: Back and Bil LEs Pain Descriptors / Indicators: Aching;Cramping;Constant Pain Intervention(s): Monitored during session;Repositioned;RN gave pain meds during session    Home Living                      Prior Function            PT Goals (current goals can now be found in the care plan section) Acute Rehab PT Goals Patient Stated Goal: to stop having back pain and to go home PT Goal Formulation: With patient Time For Goal Achievement: 09/30/14 Potential to Achieve Goals: Good Progress towards PT goals: Progressing toward goals    Frequency  Min 5X/week    PT Plan Current plan remains appropriate    Co-evaluation  End of Session Equipment Utilized During Treatment: Gait belt;Back brace Activity Tolerance: Patient limited by pain Patient left: in chair;with call bell/phone within reach;with family/visitor present     Time: 7672-0947 PT Time Calculation (min) (ACUTE ONLY): 24 min  Charges:  $Gait Training: 8-22 mins $Therapeutic Activity: 8-22 mins                    G CodesCatarina Hartshorn, [place] 09/24/2014, 3:55 PM

## 2014-09-24 NOTE — Progress Notes (Signed)
Occupational Therapy Treatment Patient Details Name: Karen Mercer MRN: [id number] DOB: 10-06-1970 Today's Date: 09/24/2014    History of present illness 44 yo female s/p PLIF L4-5 PMH: PONV, chronic back pain, RA, R UE rod, R knee surg, L knee x2 surg, back surg   OT comments  Pt met 3 out 4 goals this session and adequate level for d/c home from OT stand point. Pt remains with pain that limits activity but able to functionally complete task .   Follow Up Recommendations  Home health OT    Equipment Recommendations  3 in 1 bedside comode;Other (comment)    Recommendations for Other Services      Precautions / Restrictions Precautions Precautions: Back Precaution Comments: recalled 2 out 3 precautions ( excluded arching precautions) Required Braces or Orthoses: Spinal Brace Spinal Brace: Lumbar corset;Applied in sitting position       Mobility Bed Mobility Overal bed mobility: Modified Independent                Transfers Overall transfer level: Needs assistance Equipment used: Rolling walker (2 wheeled) Transfers: Sit to/from Stand Sit to Stand: Min guard              Balance                                   ADL Overall ADL's : Needs assistance/impaired     Grooming: Wash/dry hands;Min guard;Standing               Lower Body Dressing: Supervision/safety;Bed level Lower Body Dressing Details (indicate cue type and reason): able to cross bil LE Toilet Transfer: Supervision/safety;Ambulation;RW;Regular Toilet   Toileting- Water quality scientist and Hygiene: Supervision/safety;Sit to/from stand       Functional mobility during ADLs: Supervision/safety General ADL Comments: pt don brace mod I and doff MOD I.       Vision                     Perception     Praxis      Cognition   Behavior During Therapy: WFL for tasks assessed/performed Overall Cognitive Status: Within Functional Limits for tasks assessed                       Extremity/Trunk Assessment               Exercises     Shoulder Instructions       General Comments      Pertinent Vitals/ Pain       Pain Assessment: Faces Pain Score: 10-Worst pain ever Pain Intervention(s): Monitored during session;Patient requesting pain meds-RN notified  Home Living                                          Prior Functioning/Environment              Frequency Min 2X/week     Progress Toward Goals  OT Goals(current goals can now be found in the care plan section)  Progress towards OT goals: Progressing toward goals  Acute Rehab OT Goals Patient Stated Goal: to stop having back pain and to go home OT Goal Formulation: With patient Time For Goal Achievement: 10/07/14 Potential to Achieve Goals: Good ADL Goals Pt Will Perform Lower Body Dressing:  with min guard assist;sit to/from stand (met) Pt Will Transfer to Toilet: ambulating;bedside commode;with supervision (met) Additional ADL Goal #1: Pt. will verbalize 3/3 back precautions as precursor for ADLs (progressing) Additional ADL Goal #2: Pt will don and doff back brace while maintaining precautions in sitting as precursor to ADLs (MET)  Plan Discharge plan remains appropriate    Co-evaluation                 End of Session Equipment Utilized During Treatment: Gait belt;Rolling walker;Back brace   Activity Tolerance Patient tolerated treatment well   Patient Left in bed;with call bell/phone within reach;with family/visitor present   Nurse Communication Mobility status;Precautions        Time: 0165-5374 OT Time Calculation (min): 12 min  Charges: OT General Charges $OT Visit: 1 Procedure OT Treatments $Self Care/Home Management : 8-22 mins  Parke Poisson B 09/24/2014, 3:10 PM Pager: [phone]

## 2014-09-25 MED ORDER — BACLOFEN 10 MG PO TABS
20.0000 mg | ORAL_TABLET | Freq: Four times a day (QID) | ORAL | Status: DC
  Administered 2014-09-25 – 2014-09-26 (×5): 20 mg via ORAL
  Filled 2014-09-25 (×5): qty 2

## 2014-09-25 MED ORDER — DEXAMETHASONE SODIUM PHOSPHATE 10 MG/ML IJ SOLN
10.0000 mg | Freq: Four times a day (QID) | INTRAMUSCULAR | Status: DC
  Administered 2014-09-25 – 2014-09-26 (×5): 10 mg via INTRAVENOUS
  Filled 2014-09-25 (×6): qty 1

## 2014-09-25 NOTE — Progress Notes (Signed)
Patient with more back and bilateral lower extremity pain today left greater than right. Pain concentrated in her left anterior thigh. Symptoms aggravated with movement. No new numbness or weakness.  Afebrile. Vitals are stable. Awake and alert. Oriented and appropriate. Obviously uncomfortable. Motor and sensory function appears intact on exam. Difficult secondary to poor effort secondary to pain however. Dressing dry. Abdomen soft.  Status post lumbar decompression and fusion. Patient with increased postoperative pain. Likely secondary to swelling and/or irritation. We'll try Decadron to see if this doesn't improve her symptoms. If not improved tomorrow then follow-up MRI scan.

## 2014-09-25 NOTE — Progress Notes (Signed)
Physical Therapy Treatment Patient Details Name: Karen Mercer MRN: [id number] DOB: Dec 19, 1970 Today's Date: 09/25/2014    History of Present Illness 44 yo female s/p PLIF L4-5 PMH: PONV, chronic back pain, RA, R UE rod, R knee surg, L knee x2 surg, back surg    PT Comments    Pt with increase in overall pain this session limiting gait distance.  Pt reports unable to sleep due to mm spasms in upper thighs.  MD entered room at end of session to further discuss pain management.  Will continue to follow.   Follow Up Recommendations  Home health PT;Supervision/Assistance - 24 hour     Equipment Recommendations  Rolling walker with 5" wheels    Recommendations for Other Services       Precautions / Restrictions Precautions Precautions: Back Precaution Booklet Issued: Yes (comment) Precaution Comments: pt able to verbalize 3/3 back precautions.   Required Braces or Orthoses: Spinal Brace Spinal Brace: Lumbar corset;Applied in sitting position Restrictions Weight Bearing Restrictions: No    Mobility  Bed Mobility Overal bed mobility: Needs Assistance Bed Mobility: Rolling;Sidelying to Sit Rolling: Supervision Sidelying to sit: Supervision       General bed mobility comments: VCs for proper hand and LE placement to prevent twisting  Transfers Overall transfer level: Needs assistance Equipment used: Rolling walker (2 wheeled) Transfers: Sit to/from Stand Sit to Stand: Supervision         General transfer comment: cues for UE use during first transfer, but pt demonstated carry over on each addition transfer.    Ambulation/Gait Ambulation/Gait assistance: Supervision Ambulation Distance (Feet): 80 Feet Assistive device: Rolling walker (2 wheeled) Gait Pattern/deviations: Step-to pattern;Shuffle     General Gait Details: pt moves slowly and utilizes RW for support.     Stairs            Wheelchair Mobility    Modified Rankin (Stroke Patients Only)        Balance                                    Cognition Arousal/Alertness: Awake/alert Behavior During Therapy: WFL for tasks assessed/performed Overall Cognitive Status: Within Functional Limits for tasks assessed                      Exercises      General Comments        Pertinent Vitals/Pain Pain Assessment: 0-10 Pain Score: 8  Pain Location: back and upper thighs Pain Descriptors / Indicators: Cramping Pain Intervention(s): Premedicated before session;Repositioned    Home Living                      Prior Function            PT Goals (current goals can now be found in the care plan section) Acute Rehab PT Goals Patient Stated Goal: to stop having back pain and to go home PT Goal Formulation: With patient Time For Goal Achievement: 09/30/14 Potential to Achieve Goals: Good Progress towards PT goals: Progressing toward goals    Frequency  Min 5X/week    PT Plan Current plan remains appropriate    Co-evaluation             End of Session Equipment Utilized During Treatment: Gait belt;Back brace Activity Tolerance: Patient limited by pain Patient left: in chair;with call bell/phone within reach;with family/visitor present  Time: 5974-1638 PT Time Calculation (min) (ACUTE ONLY): 24 min  Charges:                       G Codes:      Kaloni Bisaillon 2014-10-22, 9:30 AM   Antoine Poche, PT DPT [phone]

## 2014-09-25 NOTE — Progress Notes (Signed)
Patient continues to complain of right leg spasm. Call to provider.

## 2014-09-25 NOTE — Progress Notes (Signed)
M.D advised to increase baclofent to 69m  Four times a day and give patient  A dose of 134mnow.

## 2014-09-25 NOTE — Progress Notes (Signed)
New orders received from Dr Trenton Gammon.  Patient made aware.

## 2014-09-26 MED ORDER — BACLOFEN 20 MG PO TABS: 20.0000 mg | ORAL_TABLET | Freq: Four times a day (QID) | ORAL | Status: DC

## 2014-09-26 MED ORDER — OXYCODONE HCL 15 MG PO TABS: 15.0000 mg | ORAL_TABLET | ORAL | Status: DC | PRN

## 2014-09-26 MED ORDER — METHYLPREDNISOLONE 4 MG PO TBPK: ORAL_TABLET | ORAL | Status: DC

## 2014-09-26 NOTE — Discharge Instructions (Signed)
Spinal Fusion Care After  These instructions give you information on caring for yourself after your procedure. Your doctor may also give you more specific instructions. Call your doctor if you have any problems or questions after your procedure. HOME CARE  Only take medicines as told by your doctor.  Do not drive if you are taking certain pain medicines (narcotics).  Change your bandage (dressing) as told by your doctor.  Do not get the wound wet. Do not take baths or swim until your doctor says it is okay.  Check the wound often for redness, puffiness (swelling), or leaking fluids.  Ask your doctor what activities you should avoid and for how long.  Walk as much as you can.  Do not sit for long periods of time. Change positions every hour.  Do not lift anything heavier than 5 to 10 pounds (2.25 to 4.5 kilograms) until your doctor says it is safe.  Do not twist or bend for 6 weeks. Try not to pull on things. Do not hold things away from your body.  Ask your doctor what exercises you should do. Exercises can help make the back stronger. GET HELP RIGHT AWAY IF:  Your pain suddenly gets worse.  The wound is red, puffy, bleeding, or leaking fluid.  Your legs or feet are painful, puffy, weak, or lose feeling (numb).  You cannot control when you pee (urinate) or poop (bowel movement).  You have trouble breathing.  You have chest pain.  You have a temperature by mouth above 102 F (38.9 C), not controlled by medicine. MAKE SURE YOU:  Understand these instructions.  Will watch your condition.  Will get help right away if you are not doing well or get worse. Document Released: 08/17/2010 Document Revised: 07/16/2011 Document Reviewed: 08/17/2010 [facility] Patient Information 2015 [place], [place]. This information is not intended to replace advice given to you by your health care provider. Make sure you discuss any questions you have with your health care provider.

## 2014-09-26 NOTE — Discharge Summary (Signed)
Physician Discharge Summary  Patient ID: Karen Mercer MRN: [id number] DOB/AGE: 11/20/1970 44 y.o.  Admit date: 09/22/2014 Discharge date: 09/26/2014  Admission Diagnoses:  Discharge Diagnoses:  Active Problems:   Lumbar degenerative disc disease   Discharged Condition: good  Hospital Course: Patient in the hospital where she underwent and, gait lumbar decompression and fusion with resection of synovial cyst. Postoperatively the patient progressed slowly for her first 2 hospital days but made great improvement following that. Currently she is up ambulating without difficulty. Her back and radicular pain are very much improved. She is having no symptoms of numbness or weakness. She is ready for discharge home.  Consults:   Significant Diagnostic Studies:   Treatments:   Discharge Exam: Blood pressure 136/90, pulse 74, temperature 98.5 F (36.9 C), temperature source Oral, resp. rate 20, height 5' 3"  (1.6 m), weight 71.124 kg (156 lb 12.8 oz), last menstrual period 09/09/2014, SpO2 98 %. Awake and alert. Oriented and appropriate. Motor and sensory function intact. Wound clean and dry. Chest abdomen benign.  Disposition: 01-Home or Self Care     Medication List    STOP taking these medications        methylPREDNIsolone 4 MG tablet  Commonly known as:  MEDROL DOSPACK  Replaced by:  methylPREDNISolone 4 MG Tbpk tablet     predniSONE 10 MG tablet  Commonly known as:  STERAPRED UNI-PAK      TAKE these medications        baclofen 20 MG tablet  Commonly known as:  LIORESAL  Take 1 tablet (20 mg total) by mouth 4 (four) times daily.     diazepam 5 MG tablet  Commonly known as:  VALIUM  Take 1 tablet (5 mg total) by mouth every 6 (six) hours as needed for muscle spasms.     gabapentin 100 MG capsule  Commonly known as:  NEURONTIN  Take 1 capsule (100 mg total) by mouth 3 (three) times daily.     ibuprofen 800 MG tablet  Commonly known as:  ADVIL,MOTRIN  Take 800 mg  by mouth every 8 (eight) hours as needed.     ibuprofen 800 MG tablet  Commonly known as:  ADVIL,MOTRIN  Take 1 tablet (800 mg total) by mouth 3 (three) times daily.     methylPREDNISolone 4 MG Tbpk tablet  Commonly known as:  MEDROL DOSEPAK  follow package directions     oxyCODONE 15 MG immediate release tablet  Commonly known as:  ROXICODONE  Take 1 tablet (15 mg total) by mouth every 4 (four) hours as needed. pain     oxyCODONE-acetaminophen 7.5-325 MG per tablet  Commonly known as:  PERCOCET  Take 1 tablet by mouth every 4 (four) hours as needed for pain.           Follow-up Information    Follow up with Floyce Stakes, MD.   Specialty:  Neurosurgery   Contact information:   [address] [place] [address] [phone]       Signed: Charlie Pitter 09/26/2014, 9:58 AM

## 2014-09-26 NOTE — Progress Notes (Signed)
Pt d/c to home by car with family. Assessment stable. Prescriptions given. Pt verbalizes understanding of d/c instructions. All questions answered.

## 2014-09-26 NOTE — Care Management Note (Signed)
Case Management Note  Patient Details  Name: Karen Mercer MRN: [id number] Date of Birth: [birth year]  Subjective/Objective:                    Lumbar degenerative disc disease Action/Plan: Discharge planning  Expected Discharge Date:  09/26/14               Expected Discharge Plan:  [place]  In-House Referral:     Discharge planning Services  CM Consult  Post Acute Care Choice:  Home Health Choice offered to:  Patient  DME Arranged:  3-N-1, Walker rolling DME Agency:  [place] Arranged:  PT, OT [facility] Agency:  [place]  Status of Service:  Completed, signed off  Medicare Important Message Given:    Date Medicare IM Given:    Medicare IM give by:    Date Additional Medicare IM Given:    Additional Medicare Important Message give by:     If discussed at [place] of Stay Meetings, dates discussed:    Additional Comments: CM spoke with pt who [facility] for HH[facility].  CM called [facility] James to please deliver the 3n1 and rolling walker to room so pt can discharge.  CM called [facility] tiffany with referral.  No other CM needs were communicated.   Dellie Catholic, RN 09/26/2014, 12:44 PM

## 2014-11-09 ENCOUNTER — Telehealth: Payer: Self-pay | Admitting: Orthopedic Surgery

## 2014-11-09 NOTE — Telephone Encounter (Signed)
Patient is currently elevating and icing, any other suggestions?

## 2014-11-09 NOTE — Telephone Encounter (Signed)
Patient called to request to speak with nurse (or doctor) regarding fluid building up in right knee.  She is also under post operative care of neurosurgeon, status post back surgeries.  Her ph# is [phone].

## 2014-11-09 NOTE — Telephone Encounter (Signed)
Yes, let me aspirate the knee

## 2014-11-10 NOTE — Telephone Encounter (Signed)
PATIENT AWARE, STATES WILL CALL BACK TO SCHEDULE APPT

## 2014-11-10 NOTE — Telephone Encounter (Signed)
Called patient, no answer, left vm

## 2015-02-07 ENCOUNTER — Other Ambulatory Visit: Payer: Self-pay | Admitting: Neurosurgery

## 2015-02-07 DIAGNOSIS — M5126 Other intervertebral disc displacement, lumbar region: Secondary | ICD-10-CM

## 2015-04-06 ENCOUNTER — Ambulatory Visit
Admission: RE | Admit: 2015-04-06 | Discharge: 2015-04-06 | Disposition: A | Discharge status: Home / Self Care | Payer: No Typology Code available for payment source | Source: Ambulatory Visit | Attending: Neurosurgery | Admitting: Neurosurgery

## 2015-04-06 VITALS — BP 104/75 | HR 56

## 2015-04-06 DIAGNOSIS — M5136 Other intervertebral disc degeneration, lumbar region: Secondary | ICD-10-CM

## 2015-04-06 DIAGNOSIS — M5126 Other intervertebral disc displacement, lumbar region: Secondary | ICD-10-CM

## 2015-04-06 MED ORDER — IOHEXOL 180 MG/ML  SOLN
15.0000 mL | Freq: Once | INTRAMUSCULAR | Status: AC | PRN
  Administered 2015-04-06: 15 mL via INTRATHECAL

## 2015-04-06 MED ORDER — DIAZEPAM 5 MG PO TABS
10.0000 mg | ORAL_TABLET | Freq: Once | ORAL | Status: AC
Start: 2015-04-06 — End: 2015-04-06
  Administered 2015-04-06: 10 mg via ORAL

## 2015-04-06 MED ORDER — OXYCODONE-ACETAMINOPHEN 5-325 MG PO TABS
3.0000 | ORAL_TABLET | Freq: Once | ORAL | Status: AC
  Administered 2015-04-06: 3 via ORAL

## 2015-04-06 NOTE — Discharge Instructions (Signed)

## 2015-08-16 ENCOUNTER — Other Ambulatory Visit (HOSPITAL_COMMUNITY): Payer: Self-pay | Admitting: Neurosurgery

## 2015-08-16 DIAGNOSIS — M5416 Radiculopathy, lumbar region: Secondary | ICD-10-CM

## 2015-08-19 ENCOUNTER — Ambulatory Visit (HOSPITAL_COMMUNITY): Payer: Self-pay

## 2015-08-25 ENCOUNTER — Ambulatory Visit (HOSPITAL_COMMUNITY)
Admission: RE | Admit: 2015-08-25 | Discharge: 2015-08-25 | Disposition: A | Discharge status: Home / Self Care | Payer: Self-pay | Source: Ambulatory Visit | Attending: Neurosurgery | Admitting: Neurosurgery

## 2015-08-25 DIAGNOSIS — M545 Low back pain: Secondary | ICD-10-CM | POA: Insufficient documentation

## 2015-08-25 DIAGNOSIS — G8929 Other chronic pain: Secondary | ICD-10-CM | POA: Insufficient documentation

## 2015-08-25 DIAGNOSIS — M5416 Radiculopathy, lumbar region: Secondary | ICD-10-CM

## 2015-08-25 DIAGNOSIS — Z981 Arthrodesis status: Secondary | ICD-10-CM | POA: Insufficient documentation

## 2015-08-25 MED ORDER — GADOBENATE DIMEGLUMINE 529 MG/ML IV SOLN
15.0000 mL | Freq: Once | INTRAVENOUS | Status: AC | PRN
  Administered 2015-08-25: 13 mL via INTRAVENOUS

## 2016-05-15 ENCOUNTER — Ambulatory Visit (INDEPENDENT_AMBULATORY_CARE_PROVIDER_SITE_OTHER): Payer: Self-pay

## 2016-05-15 ENCOUNTER — Encounter: Payer: Self-pay | Admitting: Orthopedic Surgery

## 2016-05-15 ENCOUNTER — Ambulatory Visit (INDEPENDENT_AMBULATORY_CARE_PROVIDER_SITE_OTHER): Payer: Self-pay | Admitting: Orthopedic Surgery

## 2016-05-15 VITALS — BP 156/103 | HR 66 | Wt 153.0 lb

## 2016-05-15 DIAGNOSIS — G894 Chronic pain syndrome: Secondary | ICD-10-CM

## 2016-05-15 DIAGNOSIS — M170 Bilateral primary osteoarthritis of knee: Secondary | ICD-10-CM

## 2016-05-15 DIAGNOSIS — G8929 Other chronic pain: Secondary | ICD-10-CM

## 2016-05-15 DIAGNOSIS — M25561 Pain in right knee: Secondary | ICD-10-CM

## 2016-05-15 DIAGNOSIS — M25562 Pain in left knee: Secondary | ICD-10-CM

## 2016-05-15 DIAGNOSIS — M961 Postlaminectomy syndrome, not elsewhere classified: Secondary | ICD-10-CM

## 2016-05-15 NOTE — Progress Notes (Signed)
Patient ID: Karen Mercer, female   DOB: [birth year], 46 y.o.   MRN: [id number]  Chief Complaint  Patient presents with  . Knee Pain    BILATERAL KNEE PAIN AND SWELLING    HPI Karen Mercer is a 46 y.o. female.  Status post bilateral tibial tubercle realignment many years ago. She has failed back syndrome. She has chronic pain.  Percent bilateral knee pain and swelling giving way and locking of the left knee for the last 2-3 weeks no prior treatment  Review of Systems Review of Systems  Constitutional: Negative for chills and fever.  Musculoskeletal: Positive for arthralgias, back pain and myalgias.  Neurological: Positive for weakness and numbness.     Past Medical History:  Diagnosis Date  . Chronic back pain    both legs;DDD and stenosis   . Complication of anesthesia    had extreme headache after last back surgery - was in pacu for extended period  . Muscle spasm    takes Valium daily as needed  . Neuromuscular disorder ([place])   . PONV (postoperative nausea and vomiting)   . Rheumatoid arthritis [facility])     Past Surgical History:  Procedure Laterality Date  . BACK SURGERY     at surgical center  . KNEE ARTHROSCOPY  08/17/2011   Procedure: ARTHROSCOPY KNEE;  Surgeon: Carole Civil, MD;  Location: [facility];  Service: Orthopedics;  Laterality: Right;  with tibia tubercle transfer and proximal realignment  . KNEE ARTHROSCOPY WITH FULKERSON SLIDE Left 08/29/2012   Procedure: Arthroscopic lateral release with chondroplasty left knee;  Surgeon: Carole Civil, MD;  Location: [facility];  Service: Orthopedics;  Laterality: Left;  . KNEE ARTHROSCOPY WITH MEDIAL PATELLAR FEMORAL LIGAMENT RECONSTRUCTION Left 08/29/2012   Procedure: Open proximal realignment fulkerson;  Surgeon: Carole Civil, MD;  Location: [facility];  Service: Orthopedics;  Laterality: Left;  . KNEE SURGERY  2012   right knee-arthroscopy  . LUMBAR LAMINECTOMY/DECOMPRESSION MICRODISCECTOMY Right 06/11/2014   Procedure: Right L4-5 Lumbar diskectomy;  Surgeon: Floyce Stakes, MD;  Location: [place] [facility];  Service: Neurosurgery;  Laterality: Right;  Right L4-5 Lumbar diskectomy  . right arm  2008   rods from fractured arm  . TUBAL LIGATION  1995    Social History Social History  Substance Use Topics  . Smoking status: Current Every Day Smoker    Packs/day: 1.50    Years: 20.00    Types: Cigarettes  . Smokeless tobacco: Not on file  . Alcohol use Yes     Comment: OCCASIONALLY     Allergies  Allergen Reactions  . Tramadol Itching    Nausea     No outpatient prescriptions have been marked as taking for the 05/15/16 encounter (Office Visit) with Carole Civil, MD.      Physical Exam Physical Exam BP (!) 156/103   Pulse 66   Wt 153 lb (69.4 kg)   BMI 27.10 kg/m   Gen. appearance. The patient is well-developed and well-nourished, grooming and hygiene are normal. There are no gross congenital abnormalities  The patient is alert and oriented to person place and time  Mood and affect are normal  Ambulation Normal ambulatory pattern  Examination reveals the following: On inspection we find 2 incisions in the front of right left knee one in the medial retinaculum and 1 for the tibial tubercle alignment procedure no erythema there. No tenderness in the joint lines. Knee is hypersensitive.  With the range of motion of  135 in each knee  Stability tests were normal  anterior cruciate ligament PCL collateral ligaments with subluxation of the patella and apprehension  Strength tests revealed grade 5 motor strength at the quadriceps right and left knee  Skin we find no rash ulceration or erythema  Sensation remains intact around the knee joint with hypersensitivity  Impression vascular system shows no peripheral edema right and left ankle  Data Reviewed Plain films show patella alignment normal with minimal medial arthrosis. 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  . Chronic pain of right knee   . Chronic pain of left knee   . Failed back syndrome   . Chronic pain syndrome   . Primary osteoarthritis of knees, bilateral Yes       Plan    Inject both knees   Procedure note left knee injection verbal consent was obtained to inject left knee joint  Timeout was completed to confirm the site of injection  The medications used were 40 mg of Depo-Medrol and 1% lidocaine 3 cc  Anesthesia was provided by ethyl chloride and the skin was prepped with alcohol.  After cleaning the skin with alcohol a 20-gauge needle was used to inject the left knee joint. There were no complications. A sterile bandage was applied.   Procedure note right knee injection verbal consent was obtained to inject right knee joint  Timeout was completed to confirm the site of injection  The medications used were 40 mg of Depo-Medrol and 1% lidocaine 3 cc  Anesthesia was provided by ethyl chloride and the skin was prepped with alcohol.  After cleaning the skin with alcohol a 20-gauge needle was used to inject the right knee joint. There were no complications. A sterile bandage was applied.         Arther Abbott 05/15/2016, 9:26 AM

## 2016-05-15 NOTE — Progress Notes (Signed)
Patient ID: Karen Mercer, female   DOB: [birth year], 46 y.o.   MRN: [id number]  Chief Complaint  Patient presents with  . Knee Pain    BILATERAL KNEE PAIN AND SWELLING    HPI Karen Mercer is a 46 y.o. female.  Presents for evaluation of bilateral knee pain and swelling. The patient has had 2 tibial tubercle realignments for subacute sating fixed locating patella and has had failure of lumbar spine surgery with residual chronic pain and neurogenic pain syndrome  Presents with worsening pain and swelling both knees and locking left knee. No new trauma. No treatment to date.  Review of Systems Review of Systems  Constitutional: Negative for fever.  Respiratory: Negative for shortness of breath.   Cardiovascular: Negative for chest pain.  Musculoskeletal: Positive for arthralgias and back pain.  Neurological: Positive for weakness and numbness.    Past Medical History:  Diagnosis Date  . Chronic back pain    both legs;DDD and stenosis   . Complication of anesthesia    had extreme headache after last back surgery - was in pacu for extended period  . Muscle spasm    takes Valium daily as needed  . Neuromuscular disorder ([place])   . PONV (postoperative nausea and vomiting)   . Rheumatoid arthritis [facility])     Past Surgical History:  Procedure Laterality Date  . BACK SURGERY     at surgical center  . KNEE ARTHROSCOPY  08/17/2011   Procedure: ARTHROSCOPY KNEE;  Surgeon: Carole Civil, MD;  Location: [facility];  Service: Orthopedics;  Laterality: Right;  with tibia tubercle transfer and proximal realignment  . KNEE ARTHROSCOPY WITH FULKERSON SLIDE Left 08/29/2012   Procedure: Arthroscopic lateral release with chondroplasty left knee;  Surgeon: Carole Civil, MD;  Location: [facility];  Service: Orthopedics;  Laterality: Left;  . KNEE ARTHROSCOPY WITH MEDIAL PATELLAR FEMORAL LIGAMENT RECONSTRUCTION Left 08/29/2012   Procedure: Open proximal realignment fulkerson;  Surgeon: Carole Civil, MD;  Location: [facility];  Service: Orthopedics;  Laterality: Left;  . KNEE SURGERY  2012   right knee-arthroscopy  . LUMBAR LAMINECTOMY/DECOMPRESSION MICRODISCECTOMY Right 06/11/2014   Procedure: Right L4-5 Lumbar diskectomy;  Surgeon: Floyce Stakes, MD;  Location: [place] [facility];  Service: Neurosurgery;  Laterality: Right;  Right L4-5 Lumbar diskectomy  . right arm  2008   rods from fractured arm  . TUBAL LIGATION  1995    Social History Social History  Substance Use Topics  . Smoking status: Current Every Day Smoker    Packs/day: 1.50    Years: 20.00    Types: Cigarettes  . Smokeless tobacco: Not on file  . Alcohol use Yes     Comment: OCCASIONALLY     Allergies  Allergen Reactions  . Tramadol Itching    Nausea     No outpatient prescriptions have been marked as taking for the 05/15/16 encounter (Office Visit) with Carole Civil, MD.   Physical Exam Physical Exam BP (!) 156/103   Pulse 66   Wt 153 lb (69.4 kg)   BMI 27.10 kg/m   Gen. appearance. The patient is well-developed and well-nourished, grooming and hygiene are normal. There are no gross congenital abnormalities  The patient is alert and oriented to person place and time  Mood and affect are normal  Ambulation No assistive devices are needed, gait pattern is normal.  Examination reveals the following: On inspection we find left and right knee anterior incisions from prior tibial tubercle  realignments with medial incision for medial reefing  With the range of motion of  full range of motion right and left knee  Stability tests were normal  crucial ligament was collateral ligament stable  Patellofemoral apprehension each knee in the quadriceps muscle  Strength tests revealed grade 5 motor strength  Skin we find no rash ulceration or erythema  Sensation remains intact around the knee joint with hypersensitivity secondary patellofemoral pain syndrome  Impression vascular system shows no  peripheral edema in either knee  Data Reviewed Plain films today  Assessment      Encounter Diagnoses  Name Primary?  . Chronic pain of right knee Yes  . Chronic pain of left knee   . Failed back syndrome   . Chronic pain syndrome   . Primary osteoarthritis of knees, bilateral     Plan    Inject both knees Knee exercises quad sets terminal knee extensions no straight leg raises because of back [facility] NSAIDs if possible  Follow-up as needed   Procedure note left knee injection verbal consent was obtained to inject left knee joint  Timeout was completed to confirm the site of injection  The medications used were 40 mg of Depo-Medrol and 1% lidocaine 3 cc  Anesthesia was provided by ethyl chloride and the skin was prepped with alcohol.  After cleaning the skin with alcohol a 20-gauge needle was used to inject the left knee joint. There were no complications. A sterile bandage was applied.   Procedure note right knee injection verbal consent was obtained to inject right knee joint  Timeout was completed to confirm the site of injection  The medications used were 40 mg of Depo-Medrol and 1% lidocaine 3 cc  Anesthesia was provided by ethyl chloride and the skin was prepped with alcohol.  After cleaning the skin with alcohol a 20-gauge needle was used to inject the right knee joint. There were no complications. A sterile bandage was applied.       [place] controlled substance reporting system reviewed   Arther Abbott 05/15/2016, 9:31 AM

## 2016-05-16 ENCOUNTER — Other Ambulatory Visit: Payer: Self-pay | Admitting: *Deleted

## 2016-05-16 ENCOUNTER — Telehealth: Payer: Self-pay | Admitting: Orthopedic Surgery

## 2016-05-16 MED ORDER — ACETAMINOPHEN-CODEINE #3 300-30 MG PO TABS: 1.0000 | ORAL_TABLET | Freq: Four times a day (QID) | ORAL | 0 refills | Status: DC | PRN

## 2016-05-16 NOTE — Progress Notes (Signed)
TYL

## 2016-05-16 NOTE — Telephone Encounter (Signed)
TYLENOL 3 1 Q 6 # 30

## 2016-05-16 NOTE — Telephone Encounter (Signed)
Routing to Dr Harrison 

## 2016-05-16 NOTE — Telephone Encounter (Signed)
Patient called this morning and states that she was here yesterday and got injections in both knees.  She says that she did not sleep last night because of the pain.  She says the left knee is hurting worse than the right knee.  She wants to know if she can get something for pain.

## 2016-06-11 ENCOUNTER — Emergency Department (HOSPITAL_COMMUNITY)
Admission: EM | Admit: 2016-06-11 | Discharge: 2016-06-11 | Disposition: A | Discharge status: Home / Self Care | Payer: No Typology Code available for payment source | Attending: Emergency Medicine | Admitting: Emergency Medicine

## 2016-06-11 ENCOUNTER — Encounter (HOSPITAL_COMMUNITY): Payer: Self-pay

## 2016-06-11 DIAGNOSIS — F17210 Nicotine dependence, cigarettes, uncomplicated: Secondary | ICD-10-CM | POA: Diagnosis not present

## 2016-06-11 DIAGNOSIS — Y92410 Unspecified street and highway as the place of occurrence of the external cause: Secondary | ICD-10-CM | POA: Diagnosis not present

## 2016-06-11 DIAGNOSIS — Y939 Activity, unspecified: Secondary | ICD-10-CM | POA: Diagnosis not present

## 2016-06-11 DIAGNOSIS — S39012A Strain of muscle, fascia and tendon of lower back, initial encounter: Secondary | ICD-10-CM | POA: Diagnosis not present

## 2016-06-11 DIAGNOSIS — Y999 Unspecified external cause status: Secondary | ICD-10-CM | POA: Insufficient documentation

## 2016-06-11 DIAGNOSIS — S3992XA Unspecified injury of lower back, initial encounter: Secondary | ICD-10-CM | POA: Diagnosis present

## 2016-06-11 MED ORDER — CYCLOBENZAPRINE HCL 10 MG PO TABS: 10.0000 mg | ORAL_TABLET | Freq: Three times a day (TID) | ORAL | 0 refills | Status: DC

## 2016-06-11 MED ORDER — ACETAMINOPHEN 325 MG PO TABS
650.0000 mg | ORAL_TABLET | Freq: Once | ORAL | Status: AC
  Administered 2016-06-11: 650 mg via ORAL
  Filled 2016-06-11: qty 2

## 2016-06-11 MED ORDER — KETOROLAC TROMETHAMINE 10 MG PO TABS
10.0000 mg | ORAL_TABLET | Freq: Once | ORAL | Status: AC
  Administered 2016-06-11: 10 mg via ORAL
  Filled 2016-06-11: qty 1

## 2016-06-11 MED ORDER — DICLOFENAC SODIUM 75 MG PO TBEC: 75.0000 mg | DELAYED_RELEASE_TABLET | Freq: Two times a day (BID) | ORAL | 0 refills | Status: DC

## 2016-06-11 NOTE — Discharge Instructions (Signed)
Your blood pressure is elevated at 175/96. Please have Dr. Legrand Rams reevaluate your blood pressure soon. Your examination suggests muscle strain of your lower back. Please use a heating pad to the area. Please rest your back is much as possible. Use Flexeril 3 times daily, use diclofenac 2 times daily with food. Flexeril may cause drowsiness, please do not drive, drink alcohol, operate machinery, or participate in activities requiring concentration when taking this medication. Please see your back surgeon for additional evaluation if not improving.
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
## 2016-06-12 ENCOUNTER — Ambulatory Visit: Payer: Self-pay | Admitting: Orthopedic Surgery

## 2016-07-24 IMAGING — MR MR LUMBAR SPINE WO/W CM
4 of 7 series · 12 of 48 positions shown · IV contrast (multihance)
Comparison: 05/17/2014

CLINICAL DATA: Low back pain radiating to both legs for 7 months.
History of lumbar surgery

EXAM:
MRI LUMBAR SPINE WITHOUT AND WITH CONTRAST
TECHNIQUE: Multiplanar and multiecho pulse sequences of the lumbar spine were
obtained without and with intravenous contrast.
CONTRAST:  13mL MULTIHANCE GADOBENATE DIMEGLUMINE 529 MG/ML IV SOLN

[Series 3: T1 · sagittal · 4.0mm · 0.36mm/px · 3 of 13 slices shown]
[im 1/13]
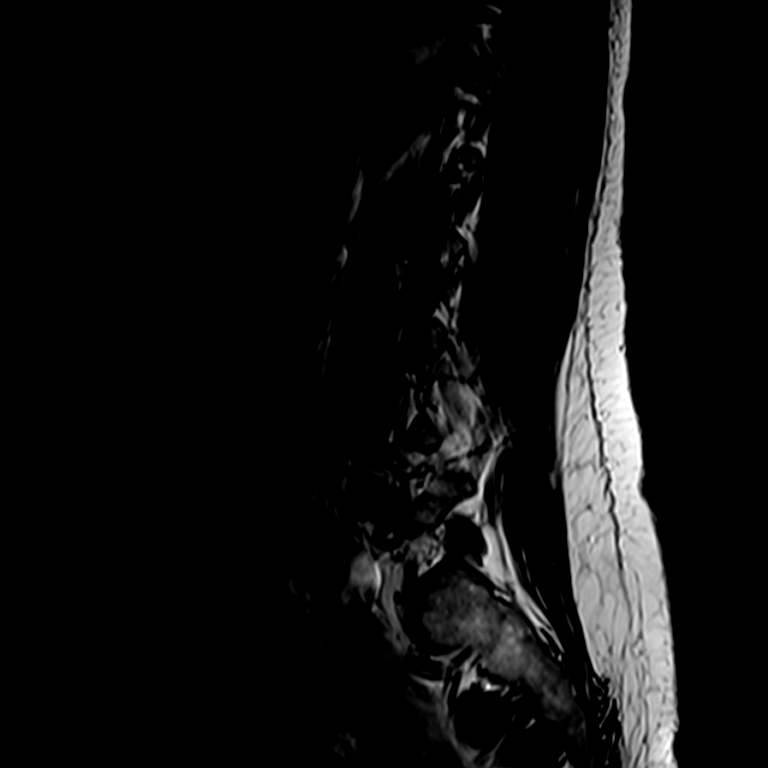
[im 9/13]
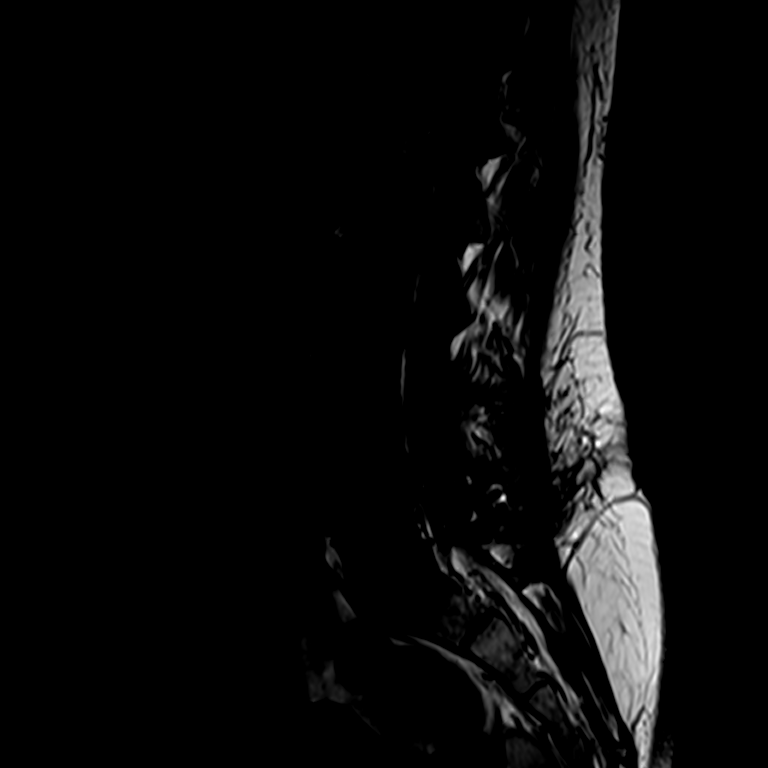
[im 13/13]
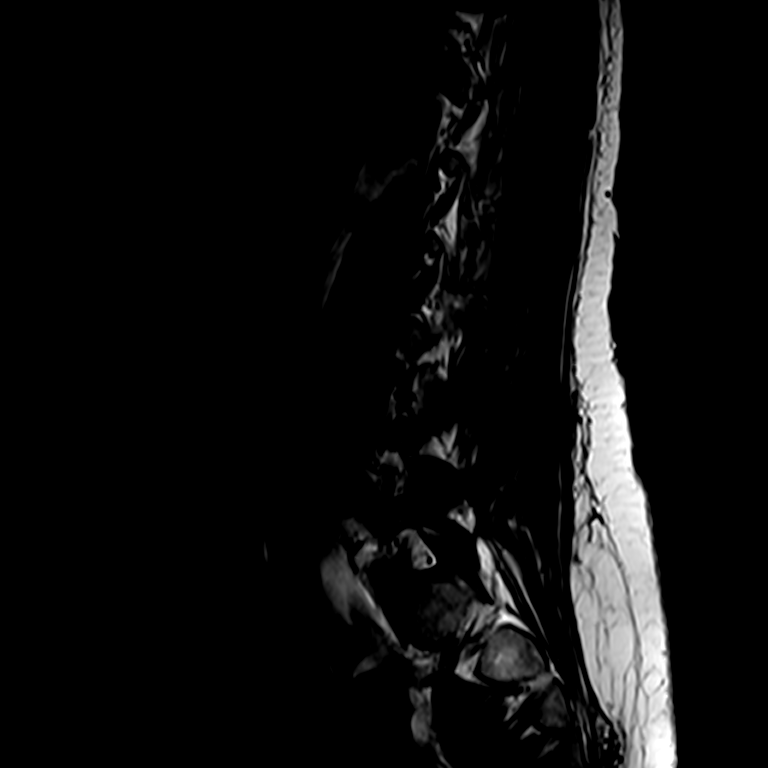

[Series 4: T2 · sagittal · 4.0mm · 0.43mm/px · 3 of 18 slices shown (1 of 2)]
[im 1/18]
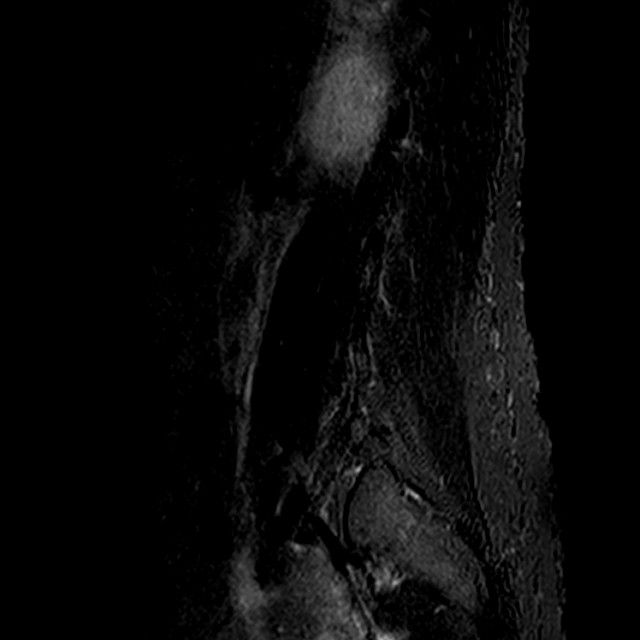
[im 9/18]
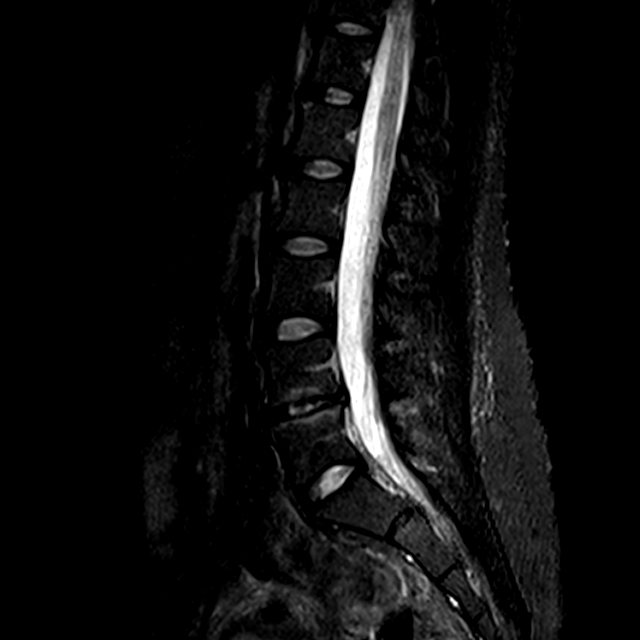
[im 18/18]
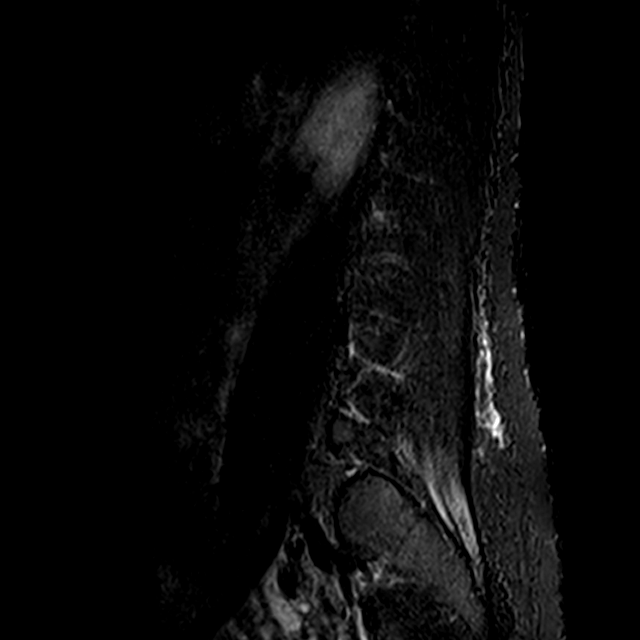

[Series 5: T2 · axial · 4.0mm · 0.26mm/px · z∈[-88,+89]mm · 3 of 41 slices shown (2 of 2)]
[im 5/41]
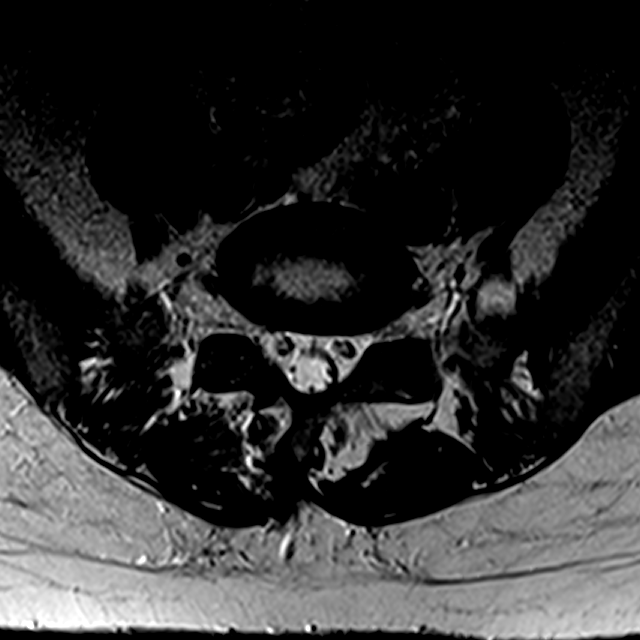
[im 21/41]
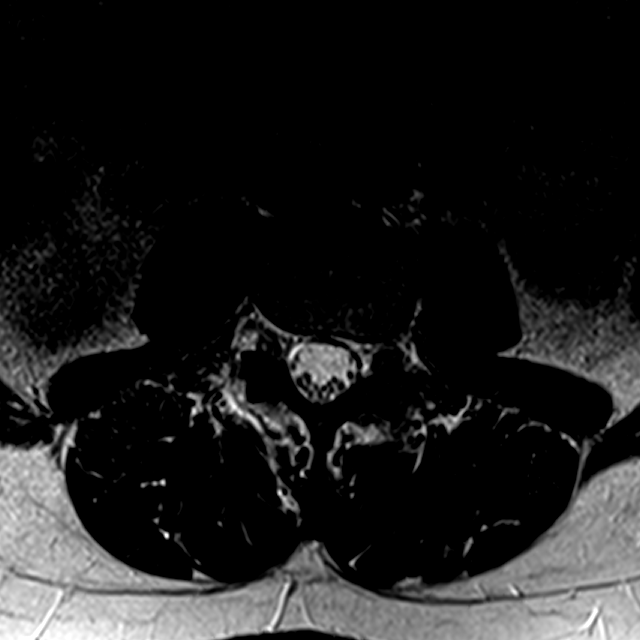
[im 37/41]
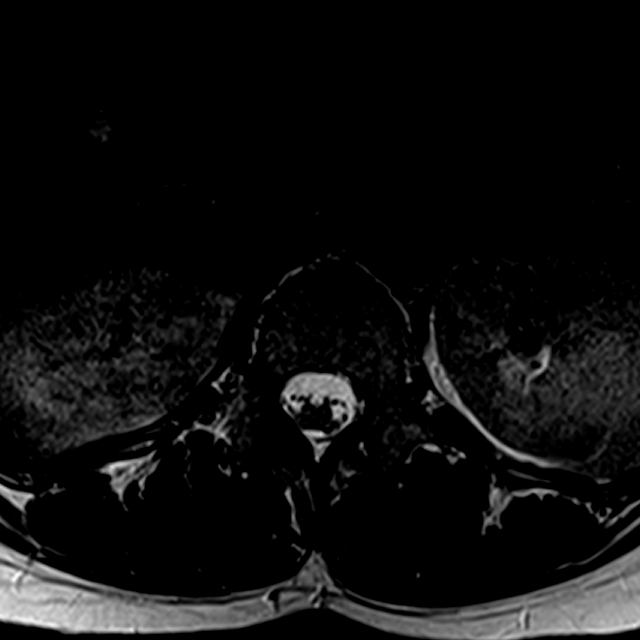

[Series 7: T2 post-contrast · sagittal · 4.0mm · 0.54mm/px · 3 of 13 slices shown]
[im 1/13]
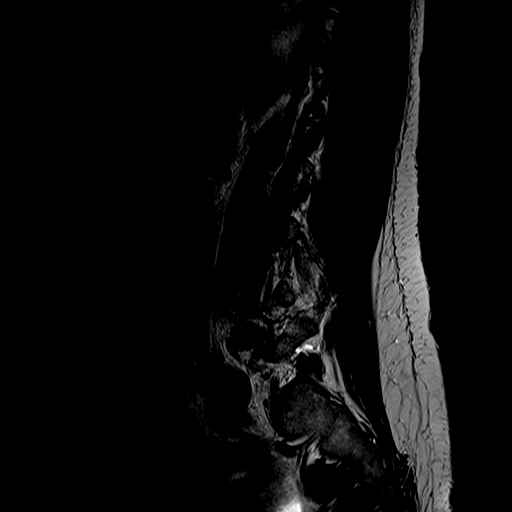
[im 7/13]
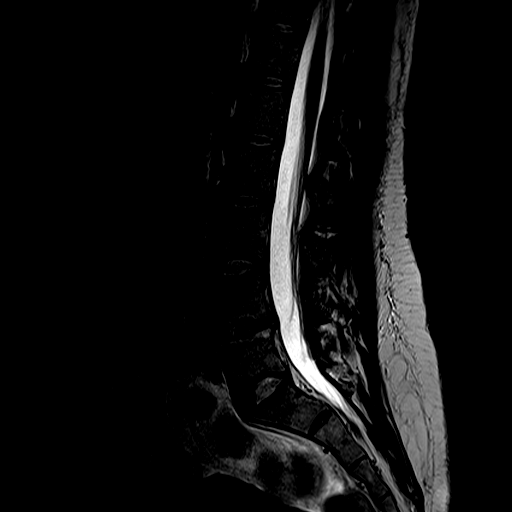
[im 13/13]
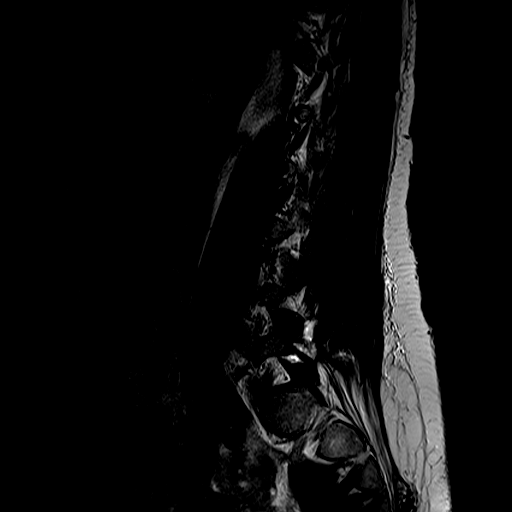

[12 of 48 positions shown; findings below may reference images not displayed]

FINDINGS: Spinal numbering as previously established. No marrow signal
abnormality suggestive of fracture, infection, or neoplasm. Normal
conus signal and morphology. No perispinal abnormality to explain
back pain. No abnormal intrathecal enhancement. No evidence of
arachnoiditis.

Degenerative changes:

T12- L1: Unremarkable.

L1-L2: Unremarkable.

L2-L3: Unremarkable.

L3-L4: Unremarkable.

L4-L5: Interval widening of a right-sided laminotomy with changes of
partial facetectomy. Fluid in the surgical defect without concerning
enhancement or mass effect. Susceptibility artifact is a limiting
factor in evaluating the operative site, but the right paracentral
disc herniation is decreased and no recurrent herniation is
suspected. Expected mild fibrosis present in the operative region.
The right foramen remains narrowed from facet spur, disc narrowing,
and bulging disc, with near complete effacement of foraminal fat.

L5-S1:Unremarkable.
IMPRESSION: 1. L4-5 right paracentral disc herniation has decreased after
surgery and residual distortion of the right subarticular recess is
likely from susceptibility artifact rather than recurrent disc.
2. Unchanged L4-5 moderate right foraminal narrowing.

## 2016-08-14 ENCOUNTER — Ambulatory Visit: Payer: Self-pay | Admitting: Physician Assistant

## 2016-08-16 ENCOUNTER — Encounter: Payer: Self-pay | Admitting: Physician Assistant

## 2016-08-16 ENCOUNTER — Ambulatory Visit: Payer: Self-pay | Admitting: Physician Assistant

## 2016-08-16 VITALS — BP 134/90 | HR 82 | Temp 97.7°F | Ht 63.5 in | Wt 149.5 lb

## 2016-08-16 DIAGNOSIS — F17210 Nicotine dependence, cigarettes, uncomplicated: Secondary | ICD-10-CM

## 2016-08-16 DIAGNOSIS — Z131 Encounter for screening for diabetes mellitus: Secondary | ICD-10-CM

## 2016-08-16 DIAGNOSIS — Z13220 Encounter for screening for lipoid disorders: Secondary | ICD-10-CM

## 2016-08-16 DIAGNOSIS — G8929 Other chronic pain: Secondary | ICD-10-CM

## 2016-08-16 DIAGNOSIS — Z862 Personal history of diseases of the blood and blood-forming organs and certain disorders involving the immune mechanism: Secondary | ICD-10-CM

## 2016-08-16 LAB — GLUCOSE, POCT (MANUAL RESULT ENTRY): POC Glucose: 101 mg/dl — AB (ref 70–99)

## 2016-08-16 NOTE — Patient Instructions (Signed)

## 2016-08-16 NOTE — Progress Notes (Signed)
BP 134/90 (BP Location: Left Arm, Patient Position: Sitting, Cuff Size: Normal)   Pulse 82   Temp 97.7 F (36.5 C) (Other (Comment))   Ht 5' 3.5" (1.613 m)   Wt 149 lb 8 oz (67.8 kg)   LMP 08/14/2016 (Exact Date)   SpO2 98%   BMI 26.07 kg/m    Subjective:    Patient ID: Karen Mercer, female    DOB: [birth year], 46 y.o.   MRN: [id number]  HPI: Karen Mercer is a 46 y.o. female presenting on 08/16/2016 for New Patient (Initial Visit)   HPI   Pt states she had PAP at [facility] last year.    She says she has disability hearing in May  She has no complaints today, she just wants to get established with PCP.  She does have chronic pain.  Relevant past medical, surgical, family and social history reviewed and updated as indicated. Interim medical history since our last visit reviewed. Allergies and medications reviewed and updated.   Current Outpatient Prescriptions:  .  ibuprofen (ADVIL,MOTRIN) 200 MG tablet, Take 400 mg by mouth every 6 (six) hours as needed., Disp: , Rfl:   Review of Systems  Constitutional: Negative for appetite change, chills, diaphoresis, fatigue, fever and unexpected weight change.  HENT: Positive for dental problem. Negative for congestion, drooling, ear pain, facial swelling, hearing loss, mouth sores, sneezing, sore throat, trouble swallowing and voice change.   Eyes: Negative for pain, discharge, redness, itching and visual disturbance.  Respiratory: Negative for cough, choking, shortness of breath and wheezing.   Cardiovascular: Negative for chest pain, palpitations and leg swelling.  Gastrointestinal: Negative for abdominal pain, blood in stool, constipation, diarrhea and vomiting.  Endocrine: Negative for cold intolerance, heat intolerance and polydipsia.  Genitourinary: Negative for decreased urine volume, dysuria and hematuria.  Musculoskeletal: Positive for arthralgias, back pain and gait problem.  Skin: Negative for rash.   Allergic/Immunologic: Negative for environmental allergies.  Neurological: Negative for seizures, syncope, light-headedness and headaches.  Hematological: Negative for adenopathy.  Psychiatric/Behavioral: Negative for agitation, dysphoric mood and suicidal ideas. The patient is not nervous/anxious.     Per HPI unless specifically indicated above     Objective:    BP 134/90 (BP Location: Left Arm, Patient Position: Sitting, Cuff Size: Normal)   Pulse 82   Temp 97.7 F (36.5 C) (Other (Comment))   Ht 5' 3.5" (1.613 m)   Wt 149 lb 8 oz (67.8 kg)   LMP 08/14/2016 (Exact Date)   SpO2 98%   BMI 26.07 kg/m   Wt Readings from Last 3 Encounters:  08/16/16 149 lb 8 oz (67.8 kg)  06/11/16 152 lb (68.9 kg)  05/15/16 153 lb (69.4 kg)    Physical Exam  Constitutional: She is oriented to person, place, and time. She appears well-developed and well-nourished.  HENT:  Head: Normocephalic and atraumatic.  Mouth/Throat: Oropharynx is clear and moist. No oropharyngeal exudate.  Eyes: Conjunctivae and EOM are normal. Pupils are equal, round, and reactive to light.  Neck: Neck supple. No thyromegaly present.  Cardiovascular: Normal rate and regular rhythm.   Pulmonary/Chest: Effort normal and breath sounds normal.  Abdominal: Soft. Bowel sounds are normal. She exhibits no mass. There is no hepatosplenomegaly. There is no tenderness.  Musculoskeletal: She exhibits no edema.  Lymphadenopathy:    She has no cervical adenopathy.  Neurological: She is alert and oriented to person, place, and time. Gait normal.  Skin: Skin is warm and dry.  Psychiatric: She has a  normal mood and affect. Her behavior is normal.  Vitals reviewed.   Results for orders placed or performed in visit on 08/16/16  POCT Glucose (CBG)  Result Value Ref Range   POC Glucose 101 (A) 70 - 99 mg/dl      Assessment & Plan:   Encounter Diagnoses  Name Primary?  . Cigarette nicotine dependence without complication Yes  .  Other chronic pain   . Screening for diabetes mellitus   . Screening cholesterol level   . History of anemia      -discussed mammogram starting at age 64 and pt agrees -pt to Get baseline labs drawn this week -Sent record request to [facility] for most recent PAP -counseled on smoking cessation -discussed with pt that [facility] is not a pain clinic and cannot Rx narcotics.  She state understanding -pt to follow up one month to review labs and recheck bp

## 2016-08-22 LAB — COMPREHENSIVE METABOLIC PANEL
ALBUMIN: 4 g/dL (ref 3.6–5.1)
ALK PHOS: 50 U/L (ref 33–115)
ALT: 21 U/L (ref 6–29)
AST: 20 U/L (ref 10–30)
BILIRUBIN TOTAL: 0.3 mg/dL (ref 0.2–1.2)
BUN: 8 mg/dL (ref 7–25)
CALCIUM: 9.4 mg/dL (ref 8.6–10.2)
CO2: 26 mmol/L (ref 20–31)
Chloride: 105 mmol/L (ref 98–110)
Creat: 0.67 mg/dL (ref 0.50–1.10)
GLUCOSE: 83 mg/dL (ref 65–99)
POTASSIUM: 4 mmol/L (ref 3.5–5.3)
Sodium: 139 mmol/L (ref 135–146)
Total Protein: 7.1 g/dL (ref 6.1–8.1)

## 2016-08-22 LAB — LIPID PANEL
CHOL/HDL RATIO: 1.7 ratio (ref <5.0)
CHOLESTEROL: 202 mg/dL — AB (ref <200)
HDL: 119 mg/dL (ref >50)
LDL Cholesterol: 73 mg/dL (ref <100)
TRIGLYCERIDES: 50 mg/dL (ref <150)
VLDL: 10 mg/dL (ref <30)

## 2016-08-22 LAB — CBC
HEMATOCRIT: 33.6 % — AB (ref 35.0–45.0)
HEMOGLOBIN: 10.8 g/dL — AB (ref 11.7–15.5)
MCH: 28.1 pg (ref 27.0–33.0)
MCHC: 32.1 g/dL (ref 32.0–36.0)
MCV: 87.3 fL (ref 80.0–100.0)
MPV: 9.8 fL (ref 7.5–12.5)
Platelets: 354 10*3/uL (ref 140–400)
RBC: 3.85 MIL/uL (ref 3.80–5.10)
RDW: 17.7 % — ABNORMAL HIGH (ref 11.0–15.0)
WBC: 7.8 10*3/uL (ref 3.8–10.8)

## 2016-08-23 LAB — HEMOGLOBIN A1C
Hgb A1c MFr Bld: 4.8 % (ref <5.7)
MEAN PLASMA GLUCOSE: 91 mg/dL

## 2016-08-30 ENCOUNTER — Ambulatory Visit: Payer: Self-pay | Admitting: Physician Assistant

## 2016-08-30 ENCOUNTER — Encounter: Payer: Self-pay | Admitting: Physician Assistant

## 2016-08-30 ENCOUNTER — Other Ambulatory Visit: Payer: Self-pay | Admitting: Physician Assistant

## 2016-08-30 VITALS — BP 140/90 | HR 69 | Temp 97.3°F | Ht 63.5 in | Wt 149.0 lb

## 2016-08-30 DIAGNOSIS — G8929 Other chronic pain: Secondary | ICD-10-CM

## 2016-08-30 DIAGNOSIS — I10 Essential (primary) hypertension: Secondary | ICD-10-CM

## 2016-08-30 DIAGNOSIS — Z1211 Encounter for screening for malignant neoplasm of colon: Secondary | ICD-10-CM

## 2016-08-30 DIAGNOSIS — D649 Anemia, unspecified: Secondary | ICD-10-CM

## 2016-08-30 DIAGNOSIS — M545 Low back pain: Secondary | ICD-10-CM

## 2016-08-30 DIAGNOSIS — M25561 Pain in right knee: Secondary | ICD-10-CM

## 2016-08-30 DIAGNOSIS — F17210 Nicotine dependence, cigarettes, uncomplicated: Secondary | ICD-10-CM

## 2016-08-30 DIAGNOSIS — M25562 Pain in left knee: Secondary | ICD-10-CM

## 2016-08-30 MED ORDER — HYDROCHLOROTHIAZIDE 12.5 MG PO CAPS: 12.5000 mg | ORAL_CAPSULE | Freq: Every day | ORAL | 1 refills | Status: DC

## 2016-08-30 NOTE — Progress Notes (Signed)
BP 140/90   Pulse 69   Temp 97.3 F (36.3 C)   Ht 5' 3.5" (1.613 m)   Wt 149 lb (67.6 kg)   LMP 08/14/2016 (Exact Date)   SpO2 99%   BMI 25.98 kg/m    Subjective:    Patient ID: Karen Mercer, female    DOB: 1970-12-07, 46 y.o.   MRN: [id number]  HPI: Karen Mercer is a 46 y.o. female presenting on 08/30/2016 for Follow-up   HPI   Pt c/o back and knee pain  [facility] states pt never seen in their office (respose from record request for PAP report)  Pt states menses normal, not heavy.  Denies blood in stool.   Relevant past medical, surgical, family and social history reviewed and updated as indicated. Interim medical history since our last visit reviewed. Allergies and medications reviewed and updated.   Current Outpatient Prescriptions:  .  ibuprofen (ADVIL,MOTRIN) 200 MG tablet, Take 400 mg by mouth every 6 (six) hours as needed., Disp: , Rfl:   Review of Systems  Constitutional: Negative for appetite change, chills, diaphoresis, fatigue, fever and unexpected weight change.  HENT: Positive for ear pain. Negative for congestion, dental problem, drooling, facial swelling, hearing loss, mouth sores, sneezing, sore throat, trouble swallowing and voice change.   Eyes: Negative for pain, discharge, redness, itching and visual disturbance.  Respiratory: Negative for cough, choking, shortness of breath and wheezing.   Cardiovascular: Negative for chest pain, palpitations and leg swelling.  Gastrointestinal: Negative for abdominal pain, blood in stool, constipation, diarrhea and vomiting.  Endocrine: Negative for cold intolerance, heat intolerance and polydipsia.  Genitourinary: Negative for decreased urine volume, dysuria and hematuria.  Musculoskeletal: Positive for arthralgias, back pain and gait problem.  Skin: Negative for rash.  Allergic/Immunologic: Negative for environmental allergies.  Neurological: Negative for seizures, syncope, light-headedness and headaches.   Hematological: Negative for adenopathy.  Psychiatric/Behavioral: Negative for agitation, dysphoric mood and suicidal ideas. The patient is not nervous/anxious.     Per HPI unless specifically indicated above     Objective:    BP 140/90   Pulse 69   Temp 97.3 F (36.3 C)   Ht 5' 3.5" (1.613 m)   Wt 149 lb (67.6 kg)   LMP 08/14/2016 (Exact Date)   SpO2 99%   BMI 25.98 kg/m   Wt Readings from Last 3 Encounters:  08/30/16 149 lb (67.6 kg)  08/16/16 149 lb 8 oz (67.8 kg)  06/11/16 152 lb (68.9 kg)    Physical Exam  Constitutional: She is oriented to person, place, and time. She appears well-developed and well-nourished.  HENT:  Head: Normocephalic and atraumatic.  Neck: Neck supple.  Cardiovascular: Normal rate and regular rhythm.   Pulmonary/Chest: Effort normal and breath sounds normal.  Abdominal: Soft. Bowel sounds are normal. She exhibits no mass. There is no hepatosplenomegaly. There is no tenderness.  Musculoskeletal: She exhibits no edema.       Right knee: Tenderness found.       Left knee: Tenderness found.       Lumbar back: She exhibits tenderness.  Lymphadenopathy:    She has no cervical adenopathy.  Neurological: She is alert and oriented to person, place, and time.  Skin: Skin is warm and dry.  Psychiatric: She has a normal mood and affect. Her behavior is normal.  Vitals reviewed.   Results for orders placed or performed in visit on 08/16/16  CBC  Result Value Ref Range   WBC 7.8 3.8 -  10.8 K/uL   RBC 3.85 3.80 - 5.10 MIL/uL   Hemoglobin 10.8 (L) 11.7 - 15.5 g/dL   HCT 33.6 (L) 35.0 - 45.0 %   MCV 87.3 80.0 - 100.0 fL   MCH 28.1 27.0 - 33.0 pg   MCHC 32.1 32.0 - 36.0 g/dL   RDW 17.7 (H) 11.0 - 15.0 %   Platelets 354 140 - 400 K/uL   MPV 9.8 7.5 - 12.5 fL  Comprehensive Metabolic Panel (CMET)  Result Value Ref Range   Sodium 139 135 - 146 mmol/L   Potassium 4.0 3.5 - 5.3 mmol/L   Chloride 105 98 - 110 mmol/L   CO2 26 20 - 31 mmol/L   Glucose,  Bld 83 65 - 99 mg/dL   BUN 8 7 - 25 mg/dL   Creat 0.67 0.50 - 1.10 mg/dL   Total Bilirubin 0.3 0.2 - 1.2 mg/dL   Alkaline Phosphatase 50 33 - 115 U/L   AST 20 10 - 30 U/L   ALT 21 6 - 29 U/L   Total Protein 7.1 6.1 - 8.1 g/dL   Albumin 4.0 3.6 - 5.1 g/dL   Calcium 9.4 8.6 - 10.2 mg/dL  HgB A1c  Result Value Ref Range   Hgb A1c MFr Bld 4.8 <5.7 %   Mean Plasma Glucose 91 mg/dL  Lipid Profile  Result Value Ref Range   Cholesterol 202 (H) <200 mg/dL   Triglycerides 50 <150 mg/dL   HDL 119 >50 mg/dL   Total CHOL/HDL Ratio 1.7 <5.0 Ratio   VLDL 10 <30 mg/dL   LDL Cholesterol 73 <100 mg/dL  POCT Glucose (CBG)  Result Value Ref Range   POC Glucose 101 (A) 70 - 99 mg/dl      Assessment & Plan:   Encounter Diagnoses  Name Primary?  . Essential hypertension Yes  . Anemia, unspecified type   . Other chronic pain   . Chronic pain of both knees   . Chronic midline low back pain without sciatica   . Cigarette nicotine dependence without complication   . Screening for colon cancer      -reviewed labs with pt -will Add iron for anemia -pt was Given ifobt in light of anemia. -rx hctz for bp -will Refer to ortho for knee/back pain -pt was Given [employer] discount application -f/u1 month to recheck bp

## 2016-08-30 NOTE — Patient Instructions (Signed)
Start iron for anemia Start HCTZ for blood pressure Return stool test to office Turn in [employer] discount applicaton

## 2016-09-13 ENCOUNTER — Ambulatory Visit: Payer: Self-pay | Admitting: Physician Assistant

## 2016-10-03 ENCOUNTER — Ambulatory Visit: Payer: Self-pay | Admitting: Physician Assistant

## 2016-10-24 ENCOUNTER — Encounter: Payer: Self-pay | Admitting: Physician Assistant

## 2016-12-04 ENCOUNTER — Encounter (HOSPITAL_COMMUNITY): Payer: Self-pay | Admitting: Emergency Medicine

## 2016-12-04 ENCOUNTER — Emergency Department (HOSPITAL_COMMUNITY)
Admission: EM | Admit: 2016-12-04 | Discharge: 2016-12-04 | Disposition: A | Discharge status: Home / Self Care | Payer: Medicare Other | Attending: Emergency Medicine | Admitting: Emergency Medicine

## 2016-12-04 DIAGNOSIS — R21 Rash and other nonspecific skin eruption: Secondary | ICD-10-CM

## 2016-12-04 DIAGNOSIS — F17210 Nicotine dependence, cigarettes, uncomplicated: Secondary | ICD-10-CM | POA: Insufficient documentation

## 2016-12-04 DIAGNOSIS — Z79899 Other long term (current) drug therapy: Secondary | ICD-10-CM | POA: Insufficient documentation

## 2016-12-04 MED ORDER — HYDROXYZINE HCL 25 MG PO TABS
50.0000 mg | ORAL_TABLET | Freq: Once | ORAL | Status: AC
  Administered 2016-12-04: 50 mg via ORAL
  Filled 2016-12-04: qty 2

## 2016-12-04 MED ORDER — DEXAMETHASONE 4 MG PO TABS: 4.0000 mg | ORAL_TABLET | Freq: Two times a day (BID) | ORAL | 0 refills | Status: DC

## 2016-12-04 MED ORDER — PREDNISONE 20 MG PO TABS
40.0000 mg | ORAL_TABLET | Freq: Once | ORAL | Status: AC
  Administered 2016-12-04: 40 mg via ORAL
  Filled 2016-12-04: qty 2

## 2016-12-04 NOTE — Discharge Instructions (Signed)
Your rash seems to be a local reaction. I find no evidence of any systemic reaction or symptoms at this time. Karen Mercer's use Benadryl every 6 hours as needed for itching. May use Claritin during the day for itching, as it is a nondrowsy antihistamine. Use Decadron 2 times daily with food. Please use a cool compress or ice pack to the rash when possible. Please see the physicians at the [facility] clinic for follow-up if needed. Please return to the emergency department if any emergent changes, problems, or concerns.

## 2016-12-04 NOTE — ED Provider Notes (Signed)
[facility] DEPT Provider Note   CSN: [id number] Arrival date & time: 12/04/16  1748     History   Chief Complaint Chief Complaint  Patient presents with  . Rash    HPI Karen Mercer is a 46 y.o. female.  Patient is a 46 year old female who presents to the emergency department with complaint of a rash to her left arm.  The patient states that she noticed this problem earlier this morning upon awaking. She states the rash is been getting progressively worse since this morning. She does not recall any insect bites, she's not been exposed to any known poison ivy or poison oak. She denies using any gloves. She denies any contact with chemicals, and his been no changes in medications or foods. She has not had on any new clothing. And she's not used any new sheets or linens. She denies previous history of this type of rash. There's been no unusual shortness of breath or difficulty with breathing. There's been no swelling of the mouth or throat. She has not taken anything for this problem up to this point.      Past Medical History:  Diagnosis Date  . Chronic back pain    both legs;DDD and stenosis   . Complication of anesthesia    had extreme headache after last back surgery - was in pacu for extended period  . Muscle spasm    takes Valium daily as needed  . Neuromuscular disorder ([place])   . PONV (postoperative nausea and vomiting)   . Rheumatoid arthritis [facility])     Patient Active Problem List   Diagnosis Date Noted  . Lumbar degenerative disc disease 09/22/2014  . Lumbar herniated disc 06/11/2014  . Chronic pain 05/14/2013  . HNP (herniated nucleus pulposus), lumbar 05/14/2013  . Leg weakness, bilateral 04/21/2013  . Radicular pain of left lower extremity 03/31/2013  . Retrolisthesis 03/31/2013  . Effusion of knee joint 10/21/2012  . Status post knee surgery 10/21/2012  . 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 Patellar  instability of left knee 07/02/2012  . Patellofemoral instability with pain 05/22/2012  . Dislocation of patella 01/16/2012  . Knee pain, bilateral 01/16/2012  . Difficulty in walking 10/31/2011  . Pain in right knee 10/31/2011  . Weakness of right leg 10/31/2011  . Knee pain 09/11/2011  . Stiffness of joint, not elsewhere classified, lower leg 09/11/2011  . Gait abnormality 09/11/2011  . Muscle weakness (generalized) 09/11/2011  . Knee instability 07/12/2011  . 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 Patellar instability 09/14/2010  . HEMARTHROSIS 04/04/2010  . SUBLUXATION PATELLAR (MALALIGNMENT) 05/04/2009  . 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 BACK SURGERY     at surgical center  . KNEE ARTHROSCOPY  08/17/2011   Procedure: ARTHROSCOPY KNEE;  Surgeon: Carole Civil, MD;  Location: [facility];  Service: Orthopedics;  Laterality: Right;  with tibia tubercle transfer and proximal realignment  . KNEE ARTHROSCOPY WITH FULKERSON SLIDE Left 08/29/2012   Procedure: Arthroscopic lateral release with chondroplasty left knee;  Surgeon: Carole Civil, MD;  Location: [facility];  Service: Orthopedics;  Laterality: Left;  . KNEE ARTHROSCOPY WITH MEDIAL PATELLAR FEMORAL LIGAMENT RECONSTRUCTION Left 08/29/2012   Procedure: Open proximal realignment fulkerson;  Surgeon: Carole Civil, MD;  Location: [facility];  Service: Orthopedics;  Laterality: Left;  . KNEE SURGERY  2012   right  knee-arthroscopy  . LUMBAR LAMINECTOMY/DECOMPRESSION MICRODISCECTOMY Right 06/11/2014   Procedure: Right L4-5 Lumbar diskectomy;  Surgeon: Floyce Stakes, MD;  Location: [facility];  Service: Neurosurgery;  Laterality: Right;  Right L4-5 Lumbar diskectomy  . right arm  2008   rods from fractured arm  . TUBAL LIGATION  1995    OB History    No data available       Home Medications    Prior to  Admission medications   Medication Sig Start Date End Date Taking? Authorizing Provider  hydrochlorothiazide (MICROZIDE) 12.5 MG capsule Take 1 capsule (12.5 mg total) by mouth daily. 08/30/16   Soyla Dryer, PA-C  ibuprofen (ADVIL,MOTRIN) 200 MG tablet Take 400 mg by mouth every 6 (six) hours as needed.    [provider]    Family History Family History  Problem Relation Age of Onset  . Arthritis Unknown        family history     Social History Social History  Substance Use Topics  . Smoking status: Current Every Day Smoker    Packs/day: 1.00    Years: 20.00    Types: Cigarettes  . Smokeless tobacco: Never Used  . Alcohol use Yes     Comment: OCCASIONALLY      Allergies   Tramadol   Review of Systems Review of Systems  Constitutional: Negative for activity change.       All ROS Neg except as noted in HPI  HENT: Negative for nosebleeds.   Eyes: Negative for photophobia and discharge.  Respiratory: Negative for cough, shortness of breath and wheezing.   Cardiovascular: Negative for chest pain and palpitations.  Gastrointestinal: Negative for abdominal pain and blood in stool.  Genitourinary: Negative for dysuria, frequency and hematuria.  Musculoskeletal: Negative for arthralgias, back pain and neck pain.  Skin: Positive for rash.  Neurological: Negative for dizziness, seizures and speech difficulty.  Psychiatric/Behavioral: Negative for confusion and hallucinations.     Physical Exam Updated Vital Signs BP (!) 155/105 (BP Location: Right Arm)   Pulse 65   Temp 98.7 F (37.1 C) (Oral)   Resp 16   Ht 5' 2"  (1.575 m)   Wt 67.1 kg (148 lb)   LMP 11/27/2016   SpO2 100%   BMI 27.07 kg/m   Physical Exam  Constitutional: She is oriented to person, place, and time. She appears well-developed and well-nourished.  Non-toxic appearance.  HENT:  Head: Normocephalic.  Right Ear: Tympanic membrane and external ear normal.  Left Ear: Tympanic membrane  and external ear normal.  No oral swelling. Airway is patent.  Eyes: Pupils are equal, round, and reactive to light. EOM and lids are normal.  Neck: Normal range of motion. Neck supple. Carotid bruit is not present.  Cardiovascular: Normal rate, regular rhythm, normal heart sounds, intact distal pulses and normal pulses.   Pulmonary/Chest: Breath sounds normal. No respiratory distress. She has no wheezes.  Abdominal: Soft. Bowel sounds are normal. There is no tenderness. There is no guarding.  Musculoskeletal: Normal range of motion.  Lymphadenopathy:       Head (right side): No submandibular adenopathy present.       Head (left side): No submandibular adenopathy present.    She has no cervical adenopathy.  Neurological: She is alert and oriented to person, place, and time. She has normal strength. No cranial nerve deficit or sensory deficit.  Skin: Skin is warm and dry.  There is a red macular rash at the ulnar-palmar aspect of  the left forearm. No red streaks appreciated. No blisters appreciated. No other rash area noted.  Psychiatric: She has a normal mood and affect. Her speech is normal.  Nursing note and vitals reviewed.    ED Treatments / Results  Labs (all labs ordered are listed, but only abnormal results are displayed) Labs Reviewed - No data to display  EKG  EKG Interpretation None       Radiology No results found.  Procedures Procedures (including critical care time)  Medications Ordered in ED Medications  hydrOXYzine (ATARAX/VISTARIL) tablet 50 mg (not administered)  predniSONE (DELTASONE) tablet 40 mg (not administered)     Initial Impression / Assessment and Plan / ED Course  I have reviewed the triage vital signs and the nursing notes.  Pertinent labs & imaging results that were available during my care of the patient were reviewed by me and considered in my medical decision making (see chart for details).       Final Clinical Impressions(s) / ED  Diagnoses MDM Blood pressure is elevated at 155/105. The remainder the vital signs within normal limits. The examination suggest a local reaction probably to an insect bite of some kind. There no systemic findings appreciated. And in particular there is no acute distress.  The patient will be treated with Benadryl and Decadron. The patient will use cool compresses/ice pack to the rash area. Patient will follow-up with primary physician or return to the emergency department if not improving. Patient is in agreement with this plan.    Final diagnoses:  Rash    New Prescriptions New Prescriptions   DEXAMETHASONE (DECADRON) 4 MG TABLET    Take 1 tablet (4 mg total) by mouth 2 (two) times daily with a meal.     Lily Kocher, PA-C 12/04/16 Magdalen Spatz, MD 12/04/16 [id number]

## 2016-12-04 NOTE — ED Triage Notes (Signed)
Patient complains of rash to left arm that started 1 week ago. States woke up this morning with hive like marks on left arm that itch.

## 2017-03-06 ENCOUNTER — Ambulatory Visit (INDEPENDENT_AMBULATORY_CARE_PROVIDER_SITE_OTHER): Payer: Medicare Other

## 2017-03-06 ENCOUNTER — Ambulatory Visit: Payer: Medicaid Other

## 2017-03-06 ENCOUNTER — Encounter: Payer: Self-pay | Admitting: Orthopedic Surgery

## 2017-03-06 ENCOUNTER — Ambulatory Visit (INDEPENDENT_AMBULATORY_CARE_PROVIDER_SITE_OTHER): Payer: Medicare Other | Admitting: Orthopedic Surgery

## 2017-03-06 VITALS — BP 166/108 | HR 80 | Ht 63.5 in | Wt 142.0 lb

## 2017-03-06 DIAGNOSIS — G8929 Other chronic pain: Secondary | ICD-10-CM

## 2017-03-06 DIAGNOSIS — M25562 Pain in left knee: Principal | ICD-10-CM

## 2017-03-06 DIAGNOSIS — M25561 Pain in right knee: Secondary | ICD-10-CM

## 2017-03-06 MED ORDER — NAPROXEN 500 MG PO TABS: 500.0000 mg | ORAL_TABLET | Freq: Two times a day (BID) | ORAL | 0 refills | Status: DC

## 2017-03-06 NOTE — Progress Notes (Signed)
Progress Note   Patient ID: Karen Mercer, female   DOB: August 05, 1970, 46 y.o.   MRN: [id number]  Chief Complaint  Patient presents with  . Knee Pain    bilateral knees     46 year old female status post lumbar fusion status post bilateral patellofemoral realignment surgery including proximal and distal realignments presents with bilateral aching throbbing severe knee pain with needlelike sensations shooting through the legs occasional locking sensation but no frank dislocation subluxation or swelling  Symptoms present now for several weeks  Currently not on any anti-inflammatories or pain medicine  Allergic to tramadol  She says she doesn't think her lumbar surgery worked       Review of Systems  Gastrointestinal: Negative.   Genitourinary: Negative.    No outpatient prescriptions have been marked as taking for the 03/06/17 encounter (Office Visit) with Carole Civil, MD.    Past Medical History:  Diagnosis Date  . Chronic back pain    both legs;DDD and stenosis   . Complication of anesthesia    had extreme headache after last back surgery - was in pacu for extended period  . Muscle spasm    takes Valium daily as needed  . Neuromuscular disorder ([place])   . PONV (postoperative nausea and vomiting)   . Rheumatoid arthritis ([facility])      Allergies  Allergen Reactions  . Tramadol Itching    Nausea     BP (!) 166/108   Pulse 80   Ht 5' 3.5" (1.613 m)   Wt 142 lb (64.4 kg)   BMI 24.76 kg/m    Physical Exam Gen. appearance the patient's appearance is normal with normal grooming and  hygiene The patient is oriented to person place and time Mood and affect are normal   Ortho Exam  She is ambulatory no assistive devices but she appears to have labored ambulation  She has bilateral knee incisions from the realignment procedures are nontender. She has no effusion in the right or left knee.I did not detect any tenderness around the knee but she definitely  hyperextends both knees approximately 15. There otherwise stable she has bilateral patellofemoral apprehension with normal motor exam Her skin incisions are well-healed is no erythema or laceration.She has a positive straight leg raise at 60 on the right negative on the left distally both legs have no peripheral edema there warm to touch and good pulses are noted    Medical decision-making  I ordered the x-rays of both knees she has 2 screws in each knee her distal realignment and proximal realignment procedures have worked well in stabilizing her patella on the static level. She may be having some dynamic instability  Please see my dictated report for the x-rays Encounter Diagnosis  Name Primary?  . Chronic pain of both knees Yes      Meds ordered this encounter  Medications  . naproxen (NAPROSYN) 500 MG tablet    Sig: Take 1 tablet (500 mg total) by mouth 2 (two) times daily with a meal.    Dispense:  60 tablet    Refill:  0   She does have an element of chronic pain.  We recommend anti-inflammatories no follow-up needed  Arther Abbott, MD 03/06/2017 3:44 PM

## 2017-07-04 ENCOUNTER — Encounter: Payer: Self-pay | Admitting: Obstetrics & Gynecology

## 2017-07-04 ENCOUNTER — Other Ambulatory Visit: Payer: Self-pay | Admitting: Adult Health

## 2017-07-10 ENCOUNTER — Ambulatory Visit (HOSPITAL_COMMUNITY)
Admission: RE | Admit: 2017-07-10 | Discharge: 2017-07-10 | Disposition: A | Discharge status: Home / Self Care | Payer: Medicare Other | Source: Ambulatory Visit | Attending: Adult Health | Admitting: Adult Health

## 2017-07-10 ENCOUNTER — Other Ambulatory Visit (HOSPITAL_COMMUNITY): Payer: Self-pay | Admitting: Adult Health

## 2017-07-10 DIAGNOSIS — M25531 Pain in right wrist: Secondary | ICD-10-CM

## 2017-07-11 ENCOUNTER — Other Ambulatory Visit (HOSPITAL_COMMUNITY): Payer: Self-pay | Admitting: Neurosurgery

## 2017-07-11 DIAGNOSIS — M5416 Radiculopathy, lumbar region: Secondary | ICD-10-CM

## 2017-07-16 ENCOUNTER — Ambulatory Visit (HOSPITAL_COMMUNITY): Payer: Medicare Other | Attending: Neurosurgery

## 2017-08-05 ENCOUNTER — Ambulatory Visit: Payer: Self-pay | Admitting: Orthopedic Surgery

## 2017-08-05 ENCOUNTER — Encounter: Payer: Self-pay | Admitting: Orthopedic Surgery

## 2017-08-13 ENCOUNTER — Ambulatory Visit (HOSPITAL_COMMUNITY)
Admission: RE | Admit: 2017-08-13 | Discharge: 2017-08-13 | Disposition: A | Discharge status: Home / Self Care | Payer: Medicare Other | Source: Ambulatory Visit | Attending: Neurosurgery | Admitting: Neurosurgery

## 2017-08-13 DIAGNOSIS — M5416 Radiculopathy, lumbar region: Secondary | ICD-10-CM | POA: Insufficient documentation

## 2017-08-13 DIAGNOSIS — Z9889 Other specified postprocedural states: Secondary | ICD-10-CM | POA: Diagnosis not present

## 2017-08-13 DIAGNOSIS — D259 Leiomyoma of uterus, unspecified: Secondary | ICD-10-CM | POA: Insufficient documentation

## 2017-08-13 LAB — POCT I-STAT CREATININE: Creatinine, Ser: 0.6 mg/dL (ref 0.44–1.00)

## 2017-08-13 MED ORDER — GADOBENATE DIMEGLUMINE 529 MG/ML IV SOLN
13.0000 mL | Freq: Once | INTRAVENOUS | Status: AC | PRN
  Administered 2017-08-13: 13 mL via INTRAVENOUS

## 2017-08-15 ENCOUNTER — Other Ambulatory Visit (HOSPITAL_COMMUNITY): Payer: Self-pay | Admitting: Adult Health

## 2017-08-15 DIAGNOSIS — Z1231 Encounter for screening mammogram for malignant neoplasm of breast: Secondary | ICD-10-CM

## 2017-09-02 ENCOUNTER — Ambulatory Visit (HOSPITAL_COMMUNITY): Payer: Medicare Other

## 2017-09-06 ENCOUNTER — Encounter: Payer: Self-pay | Admitting: Orthopedic Surgery

## 2017-09-06 ENCOUNTER — Encounter

## 2017-09-06 ENCOUNTER — Ambulatory Visit: Payer: Medicare Other | Admitting: Orthopedic Surgery

## 2017-09-12 ENCOUNTER — Ambulatory Visit (HOSPITAL_COMMUNITY): Payer: Medicare Other

## 2017-10-04 ENCOUNTER — Other Ambulatory Visit (HOSPITAL_COMMUNITY)
Admission: RE | Admit: 2017-10-04 | Discharge: 2017-10-04 | Disposition: A | Discharge status: Home / Self Care | Payer: Medicare Other | Source: Ambulatory Visit | Attending: Obstetrics and Gynecology | Admitting: Obstetrics and Gynecology

## 2017-10-04 ENCOUNTER — Encounter (INDEPENDENT_AMBULATORY_CARE_PROVIDER_SITE_OTHER): Payer: Self-pay

## 2017-10-04 ENCOUNTER — Encounter: Payer: Self-pay | Admitting: Obstetrics and Gynecology

## 2017-10-04 ENCOUNTER — Ambulatory Visit (INDEPENDENT_AMBULATORY_CARE_PROVIDER_SITE_OTHER): Payer: Medicare Other | Admitting: Obstetrics and Gynecology

## 2017-10-04 VITALS — BP 160/98 | HR 71 | Ht 64.75 in | Wt 141.6 lb

## 2017-10-04 DIAGNOSIS — Z124 Encounter for screening for malignant neoplasm of cervix: Secondary | ICD-10-CM | POA: Diagnosis not present

## 2017-10-04 DIAGNOSIS — D259 Leiomyoma of uterus, unspecified: Secondary | ICD-10-CM | POA: Diagnosis not present

## 2017-10-04 MED ORDER — SULFAMETHOXAZOLE-TRIMETHOPRIM 400-80 MG PO TABS: 1.0000 | ORAL_TABLET | Freq: Two times a day (BID) | ORAL | 0 refills | Status: DC

## 2017-10-04 NOTE — Progress Notes (Signed)
[facility] Visit  @DATE @            Patient name: Karen Mercer MRN [id number]  Date of birth: 07-Nov-1970  CC & HPI:  Karen Mercer is a [id number] 47 y.o. female presenting today for f/u of uterine fibroids found on previous lumbar MRI. Pt reports associated right lower abdominal pressure. She notes that she will intermittently have heavier menstrual cycles consisting of blood clots. Pt reports she hasn't had a pap smear in approximately 5-6 years. Pt notes that she has had previous vaginal births. Pt hasn't tried any medication for the relief of her symptoms. Denies any other symptoms.    Per patient chart review, pt had MRI lumbar spine on 08/13/2017, due to pain, weakness, and numbness radiating from the back from the leg s/p prior back surgery. MRI  showed chronic fibroid uterus.   ROS:  ROS  +uterine fibroids +right lower abdominal pressure All systems are negative except as noted in the HPI and PMH.  Pertinent History Reviewed:   Reviewed: Significant for chronic back pain Medical         Past Medical History:  Diagnosis Date  . Chronic back pain    both legs;DDD and stenosis   . Complication of anesthesia    had extreme headache after last back surgery - was in pacu for extended period  . Muscle spasm    takes Valium daily as needed  . Neuromuscular disorder ([place])   . PONV (postoperative nausea and vomiting)   . Rheumatoid arthritis ([place])                               Surgical Hx:    Past Surgical History:  Procedure Laterality Date  . BACK SURGERY     at surgical center  . KNEE ARTHROSCOPY  08/17/2011   Procedure: ARTHROSCOPY KNEE;  Surgeon: Carole Civil, MD;  Location: [facility];  Service: Orthopedics;  Laterality: Right;  with tibia tubercle transfer and proximal realignment  . KNEE ARTHROSCOPY WITH FULKERSON SLIDE Left 08/29/2012   Procedure: Arthroscopic lateral release with chondroplasty left knee;  Surgeon: Carole Civil, MD;  Location: [facility];  Service: Orthopedics;  Laterality: Left;  . KNEE ARTHROSCOPY WITH MEDIAL PATELLAR FEMORAL LIGAMENT RECONSTRUCTION Left 08/29/2012   Procedure: Open proximal realignment fulkerson;  Surgeon: Carole Civil, MD;  Location: [facility];  Service: Orthopedics;  Laterality: Left;  . KNEE SURGERY  2012   right knee-arthroscopy  . LUMBAR LAMINECTOMY/DECOMPRESSION MICRODISCECTOMY Right 06/11/2014   Procedure: Right L4-5 Lumbar diskectomy;  Surgeon: Floyce Stakes, MD;  Location: [facility];  Service: Neurosurgery;  Laterality: Right;  Right L4-5 Lumbar diskectomy  . right arm  2008   rods from fractured arm  . TUBAL LIGATION  1995   Medications: Reviewed & Updated - see associated section                       Current Outpatient Medications:  .  hydrochlorothiazide (MICROZIDE) 12.5 MG capsule, Take 1 capsule (12.5 mg total) by mouth daily. (Patient not taking: Reported on 03/06/2017), Disp: 90 capsule, Rfl: 1 .  ibuprofen (ADVIL,MOTRIN) 200 MG tablet, Take 400 mg by mouth every 6 (six) hours as needed., Disp: , Rfl:  .  naproxen (NAPROSYN) 500 MG tablet, Take 1 tablet (500 mg total) by mouth 2 (two) times daily with a meal., Disp: 60  tablet, Rfl: 0   Social History: Reviewed -  reports that she has been smoking cigarettes.  She has a 20.00 pack-year smoking history. She has never used smokeless tobacco.  Objective Findings:  Vitals: There were no vitals taken for this visit.  PHYSICAL EXAMINATION General appearance - alert, well appearing, and in no distress Mental status - alert, oriented to person, place, and time, normal mood, behavior, speech, dress, motor activity, and thought processes, affect appropriate to mood  PELVIC VULVA: normal appearing vulva with no masses, tenderness or lesions,  VAGINA: normal appearing vagina with normal color and discharge, no lesions CERVIX: normal appearing cervix without discharge or lesions, Pap done UTERUS: retroverted, enlarged to 8-10 week's  size, reduced mobility. ADNEXA: normal adnexa in size, nontender and no masses. Unremarkable.   Assessment & Plan:   A:  1.  Uterine fibroids 8-10 weeks size, stable  P:  1.  Mammogram next week as scheduled 2. Pap smear performed 3. F/u for pap smear in 3 years    By signing my name below, I, Samul Dada, attest that this documentation has been prepared under the direction and in the presence of Jonnie Kind, MD. Electronically Signed: [place]. 10/04/17. 10:15 AM.  I personally performed the services described in this documentation, which was SCRIBED in my presence. The recorded information has been reviewed and considered accurate. It has been edited as necessary during review. Jonnie Kind, MD

## 2017-10-09 LAB — CYTOLOGY - PAP
Chlamydia: NEGATIVE
Diagnosis: NEGATIVE
HPV (WINDOPATH): DETECTED — AB
NEISSERIA GONORRHEA: NEGATIVE

## 2017-11-14 DIAGNOSIS — L209 Atopic dermatitis, unspecified: Secondary | ICD-10-CM | POA: Diagnosis not present

## 2018-05-12 ENCOUNTER — Ambulatory Visit: Payer: Medicare HMO | Admitting: Orthopedic Surgery

## 2018-05-12 ENCOUNTER — Encounter: Payer: Self-pay | Admitting: Orthopedic Surgery

## 2018-07-09 DIAGNOSIS — M25562 Pain in left knee: Secondary | ICD-10-CM | POA: Diagnosis not present

## 2018-07-09 DIAGNOSIS — F419 Anxiety disorder, unspecified: Secondary | ICD-10-CM | POA: Diagnosis not present

## 2018-07-09 DIAGNOSIS — Z0001 Encounter for general adult medical examination with abnormal findings: Secondary | ICD-10-CM | POA: Diagnosis not present

## 2018-07-09 DIAGNOSIS — F321 Major depressive disorder, single episode, moderate: Secondary | ICD-10-CM | POA: Diagnosis not present

## 2018-07-09 DIAGNOSIS — Z79899 Other long term (current) drug therapy: Secondary | ICD-10-CM | POA: Diagnosis not present

## 2018-07-09 DIAGNOSIS — Z1231 Encounter for screening mammogram for malignant neoplasm of breast: Secondary | ICD-10-CM | POA: Diagnosis not present

## 2018-07-09 DIAGNOSIS — R030 Elevated blood-pressure reading, without diagnosis of hypertension: Secondary | ICD-10-CM | POA: Diagnosis not present

## 2018-07-09 DIAGNOSIS — L209 Atopic dermatitis, unspecified: Secondary | ICD-10-CM | POA: Diagnosis not present

## 2018-07-16 ENCOUNTER — Ambulatory Visit (INDEPENDENT_AMBULATORY_CARE_PROVIDER_SITE_OTHER): Payer: Medicare HMO

## 2018-07-16 ENCOUNTER — Encounter: Payer: Self-pay | Admitting: Orthopedic Surgery

## 2018-07-16 ENCOUNTER — Other Ambulatory Visit: Payer: Self-pay

## 2018-07-16 ENCOUNTER — Ambulatory Visit: Payer: Medicare HMO | Admitting: Orthopedic Surgery

## 2018-07-16 VITALS — BP 140/90 | HR 78 | Ht 65.5 in | Wt 140.0 lb

## 2018-07-16 DIAGNOSIS — S83006S Unspecified dislocation of unspecified patella, sequela: Secondary | ICD-10-CM | POA: Diagnosis not present

## 2018-07-16 DIAGNOSIS — G8929 Other chronic pain: Secondary | ICD-10-CM

## 2018-07-16 DIAGNOSIS — M25562 Pain in left knee: Secondary | ICD-10-CM | POA: Diagnosis not present

## 2018-07-16 MED ORDER — HYDROCODONE-ACETAMINOPHEN 5-325 MG PO TABS: 1.0000 | ORAL_TABLET | Freq: Four times a day (QID) | ORAL | 0 refills | Status: DC | PRN

## 2018-07-16 NOTE — Patient Instructions (Addendum)
Brace  Pain medication  MRI left knee, we are ordering the MRI he will be called with the time and date after precertification with insurance company

## 2018-07-16 NOTE — Addendum Note (Signed)
Addended byCandice Camp on: 07/16/2018 03:24 PM   Modules accepted: Orders

## 2018-07-16 NOTE — Progress Notes (Signed)
Chief Complaint  Patient presents with  . Knee Pain    left    48 year old female had a patella realignment procedure several years ago comes in complaining of popping and instability of the left knee  She is status post lumbar fusion  Complains of medial knee pain sensitive patella loud popping when she gets up duration over 3 months finally got an appointment  No swelling  Review of Systems  Constitutional: Negative for fever.  Musculoskeletal: Positive for back pain.  Neurological: Negative for tingling, focal weakness and weakness.   Past Medical History:  Diagnosis Date  . Chronic back pain    both legs;DDD and stenosis   . Complication of anesthesia    had extreme headache after last back surgery - was in pacu for extended period  . Muscle spasm    takes Valium daily as needed  . Neuromuscular disorder ([place])   . PONV (postoperative nausea and vomiting)   . Rheumatoid arthritis [facility])     Past Surgical History:  Procedure Laterality Date  . BACK SURGERY     at surgical center  . KNEE ARTHROSCOPY  08/17/2011   Procedure: ARTHROSCOPY KNEE;  Surgeon: Carole Civil, MD;  Location: [facility];  Service: Orthopedics;  Laterality: Right;  with tibia tubercle transfer and proximal realignment  . KNEE ARTHROSCOPY WITH FULKERSON SLIDE Left 08/29/2012   Procedure: Arthroscopic lateral release with chondroplasty left knee;  Surgeon: Carole Civil, MD;  Location: [facility];  Service: Orthopedics;  Laterality: Left;  . KNEE ARTHROSCOPY WITH MEDIAL PATELLAR FEMORAL LIGAMENT RECONSTRUCTION Left 08/29/2012   Procedure: Open proximal realignment fulkerson;  Surgeon: Carole Civil, MD;  Location: [facility];  Service: Orthopedics;  Laterality: Left;  . KNEE SURGERY  2012   right knee-arthroscopy  . LUMBAR LAMINECTOMY/DECOMPRESSION MICRODISCECTOMY Right 06/11/2014   Procedure: Right L4-5 Lumbar diskectomy;  Surgeon: Floyce Stakes, MD;  Location: [facility];  Service: Neurosurgery;   Laterality: Right;  Right L4-5 Lumbar diskectomy  . right arm  2008   rods from fractured arm  . TUBAL LIGATION  1995   BP 140/90   Pulse 78   Ht 5' 5.5" (1.664 m)   Wt 140 lb (63.5 kg)   BMI 22.94 kg/m  She appears to be in distress she is oriented x3 normal weight normal grooming mood pleasant affect normal  Walks normally no supportive devices  Inspection reveals previous incisions medially from the patellofemoral ligament repair/reconstruction then a longitudinal incision for the tibial tubercle realignment.  She has full range of motion there is no effusion her knee joint is stable but her patella is sensitive and passively subluxate able no muscle atrophy skin looks good she has chronic ligament laxity tissue  Pulse and temperature are normal sensation is negative for any disease in her straight leg raise is normal  X-ray stable centered patella with mild arthritis medial compartment calcification medial femoral condyle 2 screws intact no complications  Recommend brace MRI left knee

## 2018-07-17 ENCOUNTER — Telehealth: Payer: Self-pay | Admitting: Radiology

## 2018-07-17 NOTE — Telephone Encounter (Signed)
I called patient about MRI scan and made a follow up appointment

## 2018-07-23 DIAGNOSIS — F321 Major depressive disorder, single episode, moderate: Secondary | ICD-10-CM | POA: Diagnosis not present

## 2018-07-23 DIAGNOSIS — R030 Elevated blood-pressure reading, without diagnosis of hypertension: Secondary | ICD-10-CM | POA: Diagnosis not present

## 2018-07-23 DIAGNOSIS — F419 Anxiety disorder, unspecified: Secondary | ICD-10-CM | POA: Diagnosis not present

## 2018-07-23 DIAGNOSIS — L209 Atopic dermatitis, unspecified: Secondary | ICD-10-CM | POA: Insufficient documentation

## 2018-07-23 DIAGNOSIS — F439 Reaction to severe stress, unspecified: Secondary | ICD-10-CM | POA: Diagnosis not present

## 2018-07-25 ENCOUNTER — Other Ambulatory Visit: Payer: Self-pay

## 2018-07-25 ENCOUNTER — Ambulatory Visit (HOSPITAL_COMMUNITY)
Admission: RE | Admit: 2018-07-25 | Discharge: 2018-07-25 | Disposition: A | Discharge status: Home / Self Care | Payer: Medicare HMO | Source: Ambulatory Visit | Attending: Orthopedic Surgery | Admitting: Orthopedic Surgery

## 2018-07-25 DIAGNOSIS — M25562 Pain in left knee: Secondary | ICD-10-CM | POA: Insufficient documentation

## 2018-07-25 DIAGNOSIS — G8929 Other chronic pain: Secondary | ICD-10-CM

## 2018-07-25 DIAGNOSIS — S83006S Unspecified dislocation of unspecified patella, sequela: Secondary | ICD-10-CM | POA: Insufficient documentation

## 2018-07-30 ENCOUNTER — Telehealth (INDEPENDENT_AMBULATORY_CARE_PROVIDER_SITE_OTHER): Payer: Medicare HMO | Admitting: Orthopedic Surgery

## 2018-07-30 ENCOUNTER — Other Ambulatory Visit: Payer: Self-pay

## 2018-07-30 DIAGNOSIS — S83006S Unspecified dislocation of unspecified patella, sequela: Secondary | ICD-10-CM | POA: Diagnosis not present

## 2018-07-30 DIAGNOSIS — M25562 Pain in left knee: Secondary | ICD-10-CM

## 2018-07-30 MED ORDER — HYDROCODONE-ACETAMINOPHEN 5-325 MG PO TABS
1.0000 | ORAL_TABLET | Freq: Four times a day (QID) | ORAL | 0 refills | Status: AC | PRN
Start: 2018-07-30 — End: 2018-08-06

## 2018-07-30 NOTE — Progress Notes (Addendum)
Virtual Visit via Telephone Note  I connected with Karen Mercer on 07/30/18 at 10:10 AM EDT by telephone and verified that I am speaking with the correct person using two identifiers.   I discussed the limitations, risks, security and privacy concerns of performing an evaluation and management service by telephone and the availability of in person appointments. I also discussed with the patient that there may be a patient responsible charge related to this service. The patient expressed understanding and agreed to proceed.    I discussed the assessment and treatment plan with the patient. The patient was provided an opportunity to ask questions and all were answered. The patient agreed with the plan and demonstrated an understanding of the instructions.   The patient was advised to call back or seek an in-person evaluation if the symptoms worsen or if the condition fails to improve as anticipated.  I provided <5 minutes of non-face-to-face time during this encounter.   Arther Abbott, MD [place] appointment secondary to COVID-19   chief complaint pain left knee with instability  This is a 48 year old female who had an MRI to evaluate her left knee for pain and instability status post extensor mechanism realignment several years ago which did well up until recently.  The MRI shows no meniscal or ligament damage the medial patellofemoral ligament is intact there is chondromalacia of the patella and the medial femoral condyle  She is currently on hydrocodone for pain  She is on Xanax but does not take it as she is having some issues with her son causing her some anxiety  She does have upset stomach when taking tramadol no new allergies no other new medications  We refilled her hydrocodone Meds ordered this encounter  Medications  . HYDROcodone-acetaminophen (NORCO) 5-325 MG tablet    Sig: Take 1 tablet by mouth every 6 (six) hours as needed for up to 7 days for  moderate pain.    Dispense:  28 tablet    Refill:  0   Warnings were given not to get addicted to this medicine just take when needed  MRI was read and the report is included by reference as well as by copy.  CLINICAL DATA:  Chronic left knee pain.  No known injury   EXAM: MRI OF THE LEFT KNEE WITHOUT CONTRAST   TECHNIQUE: Multiplanar, multisequence MR imaging of the knee was performed. No intravenous contrast was administered.   COMPARISON:  None.   FINDINGS: MENISCI   Medial meniscus: Slight irregularity along the free edge of the posterior horn of the medial meniscus on a single image without a discrete tear.   Lateral meniscus:  Intact.   LIGAMENTS   Cruciates:  Intact ACL and PCL.   Collaterals: Medial collateral ligament is intact. Lateral collateral ligament complex is intact.   CARTILAGE   Patellofemoral: Partial-thickness cartilage loss of the medial patellofemoral compartment.   Medial: Partial-thickness cartilage loss of the medial femorotibial compartment.   Lateral:  No chondral defect.   Joint:  No joint effusion. Normal Hoffa's fat. No plical thickening.   Popliteal Fossa:  Small Baker's cyst.  Intact popliteus tendon.   Extensor Mechanism: Intact quadriceps tendon. Intact patellar tendon. Intact medial patellar retinaculum. Intact lateral patellar retinaculum. Intact MPFL.   Bones: No focal marrow signal abnormality. No fracture or dislocation. Tibial intramedullary nail with susceptibility artifact partially obscuring the adjacent soft tissue and osseous structures.   Other: No fluid collection or hematoma.  Muscles are normal.  IMPRESSION: 1. Partial-thickness cartilage loss of the medial patellofemoral compartment. 2. Partial-thickness cartilage loss of the medial femorotibial compartment. 3. No meniscal or ligamentous injury of the left knee.     Electronically Signed   By: Kathreen Devoid   On: 07/25/2018  13:54   Plan Continue patellofemoral stabilizing brace Refill hydrocodone We discussed reconstructing the medial patellofemoral ligament once the COVID-19 restrictions have been lifted   Encounter Diagnosis  Name Primary?  . Patellar dislocation, sequela Yes

## 2018-08-20 ENCOUNTER — Other Ambulatory Visit: Payer: Self-pay | Admitting: Orthopedic Surgery

## 2018-08-20 MED ORDER — HYDROCODONE-ACETAMINOPHEN 5-325 MG PO TABS: 1.0000 | ORAL_TABLET | Freq: Three times a day (TID) | ORAL | 0 refills | Status: AC | PRN

## 2018-08-20 NOTE — Telephone Encounter (Signed)
Call received from patient requesting refill, medication: HYDROcodone-acetaminophen (NORCO) 5-325 MG tablet 28 tablet 0 07/30/2018 08/06/2018    - [employer]

## 2018-09-01 ENCOUNTER — Other Ambulatory Visit: Payer: Self-pay | Admitting: Radiology

## 2018-09-01 MED ORDER — HYDROCODONE-ACETAMINOPHEN 5-325 MG PO TABS: 1.0000 | ORAL_TABLET | Freq: Four times a day (QID) | ORAL | 0 refills | Status: AC | PRN

## 2018-09-01 NOTE — Telephone Encounter (Signed)
Patient called and left message asking for a refill of hydrocodone.  She uses [facility].

## 2018-09-10 ENCOUNTER — Encounter: Payer: Self-pay | Admitting: Orthopedic Surgery

## 2018-09-10 ENCOUNTER — Other Ambulatory Visit: Payer: Self-pay

## 2018-09-10 ENCOUNTER — Telehealth: Payer: Self-pay | Admitting: Radiology

## 2018-09-10 ENCOUNTER — Ambulatory Visit (INDEPENDENT_AMBULATORY_CARE_PROVIDER_SITE_OTHER): Payer: Medicare HMO | Admitting: Orthopedic Surgery

## 2018-09-10 VITALS — BP 159/97 | HR 70 | Temp 98.4°F | Ht 65.5 in | Wt 136.0 lb

## 2018-09-10 DIAGNOSIS — M25562 Pain in left knee: Secondary | ICD-10-CM

## 2018-09-10 DIAGNOSIS — M25362 Other instability, left knee: Secondary | ICD-10-CM

## 2018-09-10 DIAGNOSIS — S83006S Unspecified dislocation of unspecified patella, sequela: Secondary | ICD-10-CM

## 2018-09-10 DIAGNOSIS — S83005D Unspecified dislocation of left patella, subsequent encounter: Secondary | ICD-10-CM | POA: Diagnosis not present

## 2018-09-10 NOTE — Telephone Encounter (Signed)
Error / no call

## 2018-09-10 NOTE — Progress Notes (Addendum)
Progress Note   Patient ID: Karen Mercer, female   DOB: 1970/06/18, 48 y.o.   MRN: [id number]   Chief Complaint  Patient presents with   Knee Pain    Left knee    48 year old female status post proximal and distal realignment several years ago presented with recurrent pain in her left knee.  She is considered for proximal realignment due to persistent subluxation dislocation episodes  Surgery was delayed due to COVID-19  Patient now on hydrocodone to control pain  Summation pain is located left knee Quality dull aching Severity 8-10 out of 10 Timing constant Duration now working on several months Associated with subluxation dislocation giving way episodes left knee    Review of Systems  Constitutional: Negative for fever.  Musculoskeletal: Positive for back pain.  Neurological: Negative for tingling.   Past Surgical History:  Procedure Laterality Date   BACK SURGERY     at surgical center   KNEE ARTHROSCOPY  08/17/2011   Procedure: ARTHROSCOPY KNEE;  Surgeon: Carole Civil, MD;  Location: [facility];  Service: Orthopedics;  Laterality: Right;  with tibia tubercle transfer and proximal realignment   KNEE ARTHROSCOPY WITH FULKERSON SLIDE Left 08/29/2012   Procedure: Arthroscopic lateral release with chondroplasty left knee;  Surgeon: Carole Civil, MD;  Location: [facility];  Service: Orthopedics;  Laterality: Left;   KNEE ARTHROSCOPY WITH MEDIAL PATELLAR FEMORAL LIGAMENT RECONSTRUCTION Left 08/29/2012   Procedure: Open proximal realignment fulkerson;  Surgeon: Carole Civil, MD;  Location: [facility];  Service: Orthopedics;  Laterality: Left;   KNEE SURGERY  2012   right knee-arthroscopy   LUMBAR LAMINECTOMY/DECOMPRESSION MICRODISCECTOMY Right 06/11/2014   Procedure: Right L4-5 Lumbar diskectomy;  Surgeon: Floyce Stakes, MD;  Location: [facility];  Service: Neurosurgery;  Laterality: Right;  Right L4-5 Lumbar diskectomy   right arm  2008   rods from  fractured arm   TUBAL LIGATION  1995   Past Medical History:  Diagnosis Date   Chronic back pain    both legs;DDD and stenosis    Complication of anesthesia    had extreme headache after last back surgery - was in pacu for extended period   Muscle spasm    takes Valium daily as needed   Neuromuscular disorder (HCC)    PONV (postoperative nausea and vomiting)    Rheumatoid arthritis (HCC)      Allergies  Allergen Reactions   Tramadol Itching    Nausea      BP (!) 159/97    Pulse 70    Temp 98.4 F (36.9 C) (Tympanic)    Ht 5' 5.5" (1.664 m)    Wt 136 lb (61.7 kg)    BMI 22.29 kg/m   Physical Exam Vitals signs and nursing note reviewed.  Constitutional:      Appearance: Normal appearance.  Musculoskeletal:       Legs:  Neurological:     Mental Status: She is alert and oriented to person, place, and time.  Psychiatric:        Mood and Affect: Mood normal.      Medical decisions:   Data  Imaging:   MRI CLINICAL DATA:  Chronic left knee pain.  No known injury   EXAM: MRI OF THE LEFT KNEE WITHOUT CONTRAST   TECHNIQUE: Multiplanar, multisequence MR imaging of the knee was performed. No intravenous contrast was administered.   COMPARISON:  None.   FINDINGS: MENISCI   Medial meniscus: Slight irregularity along the free edge of  the posterior horn of the medial meniscus on a single image without a discrete tear.   Lateral meniscus:  Intact.   LIGAMENTS   Cruciates:  Intact ACL and PCL.   Collaterals: Medial collateral ligament is intact. Lateral collateral ligament complex is intact.   CARTILAGE   Patellofemoral: Partial-thickness cartilage loss of the medial patellofemoral compartment.   Medial: Partial-thickness cartilage loss of the medial femorotibial compartment.   Lateral:  No chondral defect.   Joint:  No joint effusion. Normal Hoffa's fat. No plical thickening.   Popliteal Fossa:  Small Baker's cyst.  Intact popliteus  tendon.   Extensor Mechanism: Intact quadriceps tendon. Intact patellar tendon. Intact medial patellar retinaculum. Intact lateral patellar retinaculum. Intact MPFL.   Bones: No focal marrow signal abnormality. No fracture or dislocation. Tibial intramedullary nail with susceptibility artifact partially obscuring the adjacent soft tissue and osseous structures.   Other: No fluid collection or hematoma.  Muscles are normal.   IMPRESSION: 1. Partial-thickness cartilage loss of the medial patellofemoral compartment. 2. Partial-thickness cartilage loss of the medial femorotibial compartment. 3. No meniscal or ligamentous injury of the left knee.     Electronically Signed   By: Kathreen Devoid   On: 07/25/2018 13:54  X-rays left knee   3 views   Popping   The patient has had a prior tibial tubercle realignment   Patella is centered, Hardware is intact 2 screws tibial shingle healed   Bone fragment along the medial femoral condyle Narrowing medial joint space   Impression Stable hardware status post tibial tubercle plasty Mild degenerative changes medial compartment Calcification/ossification medial femoral condyle Patella centered     Encounter Diagnosis  Name Primary?   Patellar dislocation, sequela Yes    PLAN:  Recommend arthroscopy left knee and then proximal patellofemoral realignment  The procedure has been fully reviewed with the patient; The risks and benefits of surgery have been discussed and explained and understood. Alternative treatment has also been reviewed, questions were encouraged and answered. The postoperative plan is also been reviewed.  Meds ordered this encounter  Medications   HYDROcodone-acetaminophen (NORCO/VICODIN) 5-325 MG tablet    Sig: Take 1 tablet by mouth every 6 (six) hours as needed for up to 7 days for moderate pain.    Dispense:  28 tablet    Refill:  0      Arther Abbott, MD 09/11/2018 12:50 PM

## 2018-09-11 ENCOUNTER — Other Ambulatory Visit: Payer: Self-pay | Admitting: Orthopedic Surgery

## 2018-09-11 MED ORDER — HYDROCODONE-ACETAMINOPHEN 5-325 MG PO TABS: 1.0000 | ORAL_TABLET | Freq: Four times a day (QID) | ORAL | 0 refills | Status: AC | PRN

## 2018-09-11 NOTE — Telephone Encounter (Signed)
Hydrocodone-Acetaminophen  5/325 mg  Qty 28 Tablets Take 1 tablet by mouth every 6 (six) hours as needed for up to 7 days for moderate pain.  PATIENT USES [facility]  Patient states she asked for refill yesterday while she was in the office.

## 2018-09-11 NOTE — Addendum Note (Signed)
Addended by: Arther Abbott E on: 09/11/2018 12:50 PM   Modules accepted: Orders

## 2018-09-12 ENCOUNTER — Telehealth: Payer: Self-pay | Admitting: Orthopedic Surgery

## 2018-09-12 DIAGNOSIS — G8929 Other chronic pain: Secondary | ICD-10-CM

## 2018-09-12 DIAGNOSIS — M25562 Pain in left knee: Secondary | ICD-10-CM

## 2018-09-12 NOTE — Telephone Encounter (Signed)
Patient called saying that Karen Mercer didn't have the walker w/seat and she has talked with her insurance about it. She was given the name [facility]) [phone] to call and they told her to get our office to fax the paperwork to them at [phone].  Please call and advise

## 2018-09-15 ENCOUNTER — Other Ambulatory Visit: Payer: Self-pay | Admitting: Radiology

## 2018-09-15 MED ORDER — HYDROCODONE-ACETAMINOPHEN 5-325 MG PO TABS: 1.0000 | ORAL_TABLET | Freq: Four times a day (QID) | ORAL | 0 refills | Status: DC | PRN

## 2018-09-15 NOTE — Telephone Encounter (Signed)
Faxed to [facility]

## 2018-09-15 NOTE — Telephone Encounter (Signed)
I called her to advise /

## 2018-09-17 ENCOUNTER — Encounter: Payer: Self-pay | Admitting: Orthopedic Surgery

## 2018-09-17 ENCOUNTER — Ambulatory Visit: Payer: Medicare HMO | Admitting: Orthopedic Surgery

## 2018-09-17 ENCOUNTER — Other Ambulatory Visit: Payer: Self-pay

## 2018-09-17 VITALS — BP 166/90 | HR 76 | Temp 98.2°F | Ht 65.5 in | Wt 136.0 lb

## 2018-09-17 DIAGNOSIS — M25361 Other instability, right knee: Secondary | ICD-10-CM | POA: Diagnosis not present

## 2018-09-17 NOTE — Progress Notes (Signed)
Progress Note   Patient ID: Karen Mercer, female   DOB: [birth year], 48 y.o.   MRN: [id number]   Chief Complaint  Patient presents with  . Knee Pain    RIGHT / scheduled for left knee surgery end of May    48 year old female with plans for a left patellofemoral stabilization procedure who is status post previous proximal and distal realignment presents with increasing right knee pain over the last 5 days with a feeling of instability of the kneecap on the right side.  She describes a patella going medially.  She is describes increased pain located over patellofemoral joint which is described as a dull deep ache No swelling no trauma recently    Review of Systems  Constitutional: Negative for chills and fever.  Skin: Negative.   Neurological: Negative for tingling and sensory change.   Current Meds  Medication Sig  . HYDROcodone-acetaminophen (NORCO/VICODIN) 5-325 MG tablet Take 1 tablet by mouth every 6 (six) hours as needed for moderate pain.  Marland Kitchen HYDROcodone-acetaminophen (NORCO/VICODIN) 5-325 MG tablet Take 1 tablet by mouth every 6 (six) hours as needed for up to 7 days for moderate pain.  Marland Kitchen ibuprofen (ADVIL,MOTRIN) 200 MG tablet Take 400 mg by mouth every 6 (six) hours as needed.  . traZODone (DESYREL) 150 MG tablet     Past Medical History:  Diagnosis Date  . Chronic back pain    both legs;DDD and stenosis   . Complication of anesthesia    had extreme headache after last back surgery - was in pacu for extended period  . Muscle spasm    takes Valium daily as needed  . Neuromuscular disorder ([place])   . PONV (postoperative nausea and vomiting)   . Rheumatoid arthritis (HCC)      Allergies  Allergen Reactions  . Tramadol Itching    Nausea      BP (!) 166/90   Pulse 76   Temp 98.2 F (36.8 C)   Ht 5' 5.5" (1.664 m)   Wt 136 lb (61.7 kg)   BMI 22.29 kg/m    Physical Exam General appearance normal Oriented x3 normal Mood pleasant affect normal Gait  limping on the right  Ortho Exam Right knee  Previous skin incision medially and over the tibial tubercle from previous proximal distal realignment  Severe apprehension  Full but guarded range of motion no effusion  Collateral and cruciate ligaments stable  Muscle tone normal  Patellofemoral tenderness facet medial and lateral  Pulse and temperature normal sensation normal      MEDICAL DECISION MAKING   Imaging:  No imaging   Encounter Diagnosis  Name Primary?  . Patellar instability of right knee Yes     PLAN: (RX., injection, surgery,frx,mri/ct, XR 2 body ares) Recommend patellofemoral brace until the surgery on the left side is finished and then exam under anesthesia arthroscopic evaluation patella tracking under anesthesia and arthroscopy followed by correction of the deformity depending on the direction.  Most likely this is a lateral subluxation dislocation  No orders of the defined types were placed in this encounter.  3:32 PM 09/17/2018

## 2018-09-17 NOTE — Patient Instructions (Signed)
PTF brace

## 2018-09-18 ENCOUNTER — Other Ambulatory Visit: Payer: Self-pay | Admitting: Orthopedic Surgery

## 2018-09-18 DIAGNOSIS — S83006S Unspecified dislocation of unspecified patella, sequela: Secondary | ICD-10-CM

## 2018-09-18 DIAGNOSIS — G8929 Other chronic pain: Secondary | ICD-10-CM

## 2018-09-18 DIAGNOSIS — M25562 Pain in left knee: Secondary | ICD-10-CM

## 2018-09-18 NOTE — Telephone Encounter (Signed)
Patient called following visit yesterday, 09/17/18, as discussed - requests refill: HYDROcodone-acetaminophen (NORCO/VICODIN) 5-325 MG tablet  - [facility]

## 2018-09-19 MED ORDER — HYDROCODONE-ACETAMINOPHEN 5-325 MG PO TABS: 1.0000 | ORAL_TABLET | Freq: Four times a day (QID) | ORAL | 0 refills | Status: DC | PRN

## 2018-09-25 ENCOUNTER — Other Ambulatory Visit: Payer: Self-pay | Admitting: Orthopedic Surgery

## 2018-09-25 NOTE — Telephone Encounter (Signed)
Patient requests refill,medication: HYDROcodone-acetaminophen (NORCO/VICODIN) 5-325 MG tablet 28 tablet  -[facility]

## 2018-09-25 NOTE — Patient Instructions (Signed)
Karen Mercer  09/25/2018     @PREFPERIOPPHARMACY @   Your procedure is scheduled on  10/02/2018 .  Report to [facility] at  615  A.M.  Call this number if you have problems the morning of surgery:  [phone]   Remember:  Do not eat or drink after midnight.                      Take these medicines the morning of surgery with A SIP OF WATER  Hydrocodone( if needed).    Do not wear jewelry, make-up or nail polish.  Do not wear lotions, powders, or perfumes, or deodorant.  Do not shave 48 hours prior to surgery.  Men may shave face and neck.  Do not bring valuables to the hospital.  [facility] is not responsible for any belongings or valuables.  Contacts, dentures or bridgework may not be worn into surgery.  Leave your suitcase in the car.  After surgery it may be brought to your room.  For patients admitted to the hospital, discharge time will be determined by your treatment team.  Patients discharged the day of surgery will not be allowed to drive home.   Name and phone number of your driver:   family Special instructions:  None  Please read over the following fact sheets that you were given. Anesthesia Post-op Instructions and Care and Recovery After Surgery       Arthroscopic Knee Ligament Repair, Care After This sheet gives you information about how to care for yourself after your procedure. Your health care provider may also give you more specific instructions. If you have problems or questions, contact your health care provider. What can I expect after the procedure? After the procedure, it is common to have:  Pain in your knee.  Bruising and swelling on your knee, calf, and ankle for 3-4 days.  Fatigue. Follow these instructions at home: If you have a brace or immobilizer:  Wear the brace or immobilizer as told by your health care provider. Remove it only as told by your health care provider.  Loosen the splint or immobilizer if your toes  tingle, become numb, or turn cold and blue.  Keep the brace or immobilizer clean. Bathing  Do not take baths, swim, or use a hot tub until your health care provider approves. Ask your health care provider if you can take showers.  Keep your bandage (dressing) dry until your health care provider says that it can be removed. Cover it and your brace or immobilizer with a watertight covering when you take a shower. Incision care   Follow instructions from your health care provider about how to take care of your incision. Make sure you: ? Wash your hands with soap and water before you change your bandage (dressing). If soap and water are not available, use hand sanitizer. ? Change your dressing as told by your health care provider. ? Leave stitches (sutures), skin glue, or adhesive strips in place. These skin closures may need to stay in place for 2 weeks or longer. If adhesive strip edges start to loosen and curl up, you may trim the loose edges. Do not remove adhesive strips completely unless your health care provider tells you to do that.  Check your incision area every day for signs of infection. Check for: ? More redness, swelling, or pain. ? More fluid or blood. ? Warmth. ? Pus or a bad smell. Managing  pain, stiffness, and swelling   If directed, put ice on the affected area. ? If you have a removable brace or immobilizer, remove it as told by your health care provider. ? Put ice in a plastic bag. ? Place a towel between your skin and the bag or between your brace or immobilizer and the bag. ? Leave the ice on for 20 minutes, 2-3 times a day.  Move your toes often to avoid stiffness and to lessen swelling.  Raise (elevate) the injured area above the level of your heart while you are sitting or lying down. Driving  Do not drive until your health care provider approves. If you have a brace or immobilizer on your leg, ask your health care provider when it is safe for you to  drive.  Do not drive or use heavy machinery while taking prescription pain medicine. Activity  Rest as directed. Ask your health care provider what activities are safe for you.  Do physical therapy exercises as told by your health care provider. Physical therapy will help you regain strength and motion in your knee.  Follow instructions from your health care provider about: ? When you may start motion exercises. ? When you may start riding a stationary bike and doing other low-impact activities. ? When you may start to jog and do other high-impact activities. Safety  Do not use the injured limb to support your body weight until your health care provider says that you can. Use crutches as told by your health care provider. General instructions  Do not use any products that contain nicotine or tobacco, such as cigarettes and e-cigarettes. These can delay bone healing. If you need help quitting, ask your health care provider.  To prevent or treat constipation while you are taking prescription pain medicine, your health care provider may recommend that you: ? Drink enough fluid to keep your urine clear or pale yellow. ? Take over-the-counter or prescription medicines. ? Eat foods that are high in fiber, such as fresh fruits and vegetables, whole grains, and beans. ? Limit foods that are high in fat and processed sugars, such as fried and sweet foods.  Take over-the-counter and prescription medicines only as told by your health care provider.  Keep all follow-up visits as told by your health care provider. This is important. Contact a health care provider if:  You have more redness, swelling, or pain around an incision.  You have more fluid or blood coming from an incision.  Your incision feels warm to the touch.  You have a fever.  You have pain or swelling in your knee, and it gets worse.  You have pain that does not get better with medicine. Get help right away if:  You have  trouble breathing.  You have pus or a bad smell coming from an incision.  You have numbness and tingling near the knee joint. Summary  After the procedure, it is common to have knee pain with bruising and swelling on your knee, calf, and ankle.  Icing your knee and raising your leg above the level of your heart will help control the pain and the swelling.  Do physical therapy exercises as told by your health care provider. Physical therapy will help you regain strength and motion in your knee. This information is not intended to replace advice given to you by your health care provider. Make sure you discuss any questions you have with your health care provider. Document Released: 02/11/2013 Document Revised: 04/17/2016 Document  Reviewed: 04/17/2016 Elsevier Interactive Patient Education  2019 [place], Care After These instructions provide you with information about caring for yourself after your procedure. Your health care provider may also give you more specific instructions. Your treatment has been planned according to current medical practices, but problems sometimes occur. Call your health care provider if you have any problems or questions after your procedure. What can I expect after the procedure? After your procedure, you may:  Feel sleepy for several hours.  Feel clumsy and have poor balance for several hours.  Feel forgetful about what happened after the procedure.  Have poor judgment for several hours.  Feel nauseous or vomit.  Have a sore throat if you had a breathing tube during the procedure. Follow these instructions at home: For at least 24 hours after the procedure:      Have a responsible adult stay with you. It is important to have someone help care for you until you are awake and alert.  Rest as needed.  Do not: ? Participate in activities in which you could fall or become injured. ? Drive. ? Use heavy  machinery. ? Drink alcohol. ? Take sleeping pills or medicines that cause drowsiness. ? Make important decisions or sign legal documents. ? Take care of children on your own. Eating and drinking  Follow the diet that is recommended by your health care provider.  If you vomit, drink water, juice, or soup when you can drink without vomiting.  Make sure you have little or no nausea before eating solid foods. General instructions  Take over-the-counter and prescription medicines only as told by your health care provider.  If you have sleep apnea, surgery and certain medicines can increase your risk for breathing problems. Follow instructions from your health care provider about wearing your sleep device: ? Anytime you are sleeping, including during daytime naps. ? While taking prescription pain medicines, sleeping medicines, or medicines that make you drowsy.  If you smoke, do not smoke without supervision.  Keep all follow-up visits as told by your health care provider. This is important. Contact a health care provider if:  You keep feeling nauseous or you keep vomiting.  You feel light-headed.  You develop a rash.  You have a fever. Get help right away if:  You have trouble breathing. Summary  For several hours after your procedure, you may feel sleepy and have poor judgment.  Have a responsible adult stay with you for at least 24 hours or until you are awake and alert. This information is not intended to replace advice given to you by your health care provider. Make sure you discuss any questions you have with your health care provider. Document Released: 08/14/2015 Document Revised: 12/07/2016 Document Reviewed: 08/14/2015 Elsevier Interactive Patient Education  2019 [employer].

## 2018-09-26 ENCOUNTER — Encounter (HOSPITAL_COMMUNITY): Payer: Self-pay | Admitting: Anesthesiology

## 2018-09-26 ENCOUNTER — Encounter (HOSPITAL_COMMUNITY): Payer: Self-pay

## 2018-09-26 ENCOUNTER — Telehealth: Payer: Self-pay | Admitting: Orthopedic Surgery

## 2018-09-26 ENCOUNTER — Encounter (HOSPITAL_COMMUNITY)
Admission: RE | Admit: 2018-09-26 | Discharge: 2018-09-26 | Disposition: A | Discharge status: Home / Self Care | Payer: Medicare HMO | Source: Ambulatory Visit | Attending: Orthopedic Surgery | Admitting: Orthopedic Surgery

## 2018-09-26 MED ORDER — HYDROCODONE-ACETAMINOPHEN 5-325 MG PO TABS: 1.0000 | ORAL_TABLET | Freq: Four times a day (QID) | ORAL | 0 refills | Status: DC | PRN

## 2018-09-26 NOTE — Telephone Encounter (Signed)
I called her will cancel surgery  RS when she is ready

## 2018-09-26 NOTE — Telephone Encounter (Signed)
Patient called just now and wanted to cancel her surgery for next week. Her mom passed away yesterday. She will call to reschedule later on and stated she hasn't been able to get in contact with anyone to cancel her pre-op today. I told her to keep trying, but I would mention it to you when I send this message.

## 2018-10-01 ENCOUNTER — Other Ambulatory Visit (HOSPITAL_COMMUNITY): Payer: Medicare HMO

## 2018-10-02 ENCOUNTER — Ambulatory Visit: Admit: 2018-10-02 | Payer: Medicare HMO | Admitting: Orthopedic Surgery

## 2018-10-02 SURGERY — REPAIR, TENDON, PATELLAR, ARTHROSCOPIC
Anesthesia: Choice | Laterality: Left

## 2018-10-06 ENCOUNTER — Other Ambulatory Visit: Payer: Self-pay | Admitting: Orthopedic Surgery

## 2018-10-08 ENCOUNTER — Telehealth: Payer: Self-pay | Admitting: Orthopedic Surgery

## 2018-10-10 ENCOUNTER — Ambulatory Visit: Payer: Medicare HMO | Admitting: Orthopedic Surgery

## 2018-10-14 ENCOUNTER — Other Ambulatory Visit: Payer: Self-pay | Admitting: Orthopedic Surgery

## 2018-10-14 MED ORDER — HYDROCODONE-ACETAMINOPHEN 5-325 MG PO TABS: 1.0000 | ORAL_TABLET | Freq: Four times a day (QID) | ORAL | 0 refills | Status: DC | PRN

## 2018-10-14 NOTE — Telephone Encounter (Signed)
Surgery will be July 9th

## 2018-10-14 NOTE — Telephone Encounter (Signed)
Patient requests refill on Hydrocodone/Acetaminophen 5-325  Mgs.   Qty  28   Sig: Take 1 tablet by mouth every 6 (six) hours as needed for moderate pain.  Patient states she uses [facility] ASKING ABOUT COMING IN TO BE SEEN VERSUS GOING AHEAD WITH HER SURGERY  Please call her regarding this

## 2018-10-22 ENCOUNTER — Other Ambulatory Visit: Payer: Self-pay | Admitting: Orthopedic Surgery

## 2018-10-22 MED ORDER — HYDROCODONE-ACETAMINOPHEN 5-325 MG PO TABS: 1.0000 | ORAL_TABLET | Freq: Four times a day (QID) | ORAL | 0 refills | Status: DC | PRN

## 2018-10-22 NOTE — Telephone Encounter (Signed)
Patient called for refill: HYDROcodone-acetaminophen (NORCO/VICODIN) 5-325 MG tablet   - Pharmacy: [facility]

## 2018-11-03 ENCOUNTER — Other Ambulatory Visit: Payer: Self-pay

## 2018-11-03 MED ORDER — HYDROCODONE-ACETAMINOPHEN 5-325 MG PO TABS: 1.0000 | ORAL_TABLET | Freq: Four times a day (QID) | ORAL | 0 refills | Status: DC | PRN

## 2018-11-03 NOTE — Patient Instructions (Signed)
Karen Mercer  11/03/2018     @PREFPERIOPPHARMACY @   Your procedure is scheduled on  11/13/2018 .  Report to [facility] at  830   A.M.  Call this number if you have problems the morning of surgery:  [phone]   Remember:  Do not eat or drink after midnight.                       Take these medicines the morning of surgery with A SIP OF WATER  Hydrocodone(if needed).    Do not wear jewelry, make-up or nail polish.  Do not wear lotions, powders, or perfumes, or deodorant.  Do not shave 48 hours prior to surgery.  Men may shave face and neck.  Do not bring valuables to the hospital.  [facility] is not responsible for any belongings or valuables.  Contacts, dentures or bridgework may not be worn into surgery.  Leave your suitcase in the car.  After surgery it may be brought to your room.  For patients admitted to the hospital, discharge time will be determined by your treatment team.  Patients discharged the day of surgery will not be allowed to drive home.   Name and phone number of your driver:   family Special instructions:  None  Please read over the following fact sheets that you were given. Anesthesia Post-op Instructions and Care and Recovery After Surgery       Arthroscopic Knee Ligament Repair, Care After This sheet gives you information about how to care for yourself after your procedure. Your health care provider may also give you more specific instructions. If you have problems or questions, contact your health care provider. What can I expect after the procedure? After the procedure, it is common to have:  Pain in your knee.  Bruising and swelling on your knee, calf, and ankle for 3-4 days.  Fatigue. Follow these instructions at home: If you have a brace or immobilizer:  Wear the brace or immobilizer as told by your health care provider. Remove it only as told by your health care provider.  Loosen the splint or immobilizer if your toes  tingle, become numb, or turn cold and blue.  Keep the brace or immobilizer clean. Bathing  Do not take baths, swim, or use a hot tub until your health care provider approves. Ask your health care provider if you can take showers.  Keep your bandage (dressing) dry until your health care provider says that it can be removed. Cover it and your brace or immobilizer with a watertight covering when you take a shower. Incision care   Follow instructions from your health care provider about how to take care of your incision. Make sure you: ? Wash your hands with soap and water before you change your bandage (dressing). If soap and water are not available, use hand sanitizer. ? Change your dressing as told by your health care provider. ? Leave stitches (sutures), skin glue, or adhesive strips in place. These skin closures may need to stay in place for 2 weeks or longer. If adhesive strip edges start to loosen and curl up, you may trim the loose edges. Do not remove adhesive strips completely unless your health care provider tells you to do that.  Check your incision area every day for signs of infection. Check for: ? More redness, swelling, or pain. ? More fluid or blood. ? Warmth. ? Pus or a  bad smell. Managing pain, stiffness, and swelling   If directed, put ice on the affected area. ? If you have a removable brace or immobilizer, remove it as told by your health care provider. ? Put ice in a plastic bag. ? Place a towel between your skin and the bag or between your brace or immobilizer and the bag. ? Leave the ice on for 20 minutes, 2-3 times a day.  Move your toes often to avoid stiffness and to lessen swelling.  Raise (elevate) the injured area above the level of your heart while you are sitting or lying down. Driving  Do not drive until your health care provider approves. If you have a brace or immobilizer on your leg, ask your health care provider when it is safe for you to drive.   Do not drive or use heavy machinery while taking prescription pain medicine. Activity  Rest as directed. Ask your health care provider what activities are safe for you.  Do physical therapy exercises as told by your health care provider. Physical therapy will help you regain strength and motion in your knee.  Follow instructions from your health care provider about: ? When you may start motion exercises. ? When you may start riding a stationary bike and doing other low-impact activities. ? When you may start to jog and do other high-impact activities. Safety  Do not use the injured limb to support your body weight until your health care provider says that you can. Use crutches as told by your health care provider. General instructions  Do not use any products that contain nicotine or tobacco, such as cigarettes and e-cigarettes. These can delay bone healing. If you need help quitting, ask your health care provider.  To prevent or treat constipation while you are taking prescription pain medicine, your health care provider may recommend that you: ? Drink enough fluid to keep your urine clear or pale yellow. ? Take over-the-counter or prescription medicines. ? Eat foods that are high in fiber, such as fresh fruits and vegetables, whole grains, and beans. ? Limit foods that are high in fat and processed sugars, such as fried and sweet foods.  Take over-the-counter and prescription medicines only as told by your health care provider.  Keep all follow-up visits as told by your health care provider. This is important. Contact a health care provider if:  You have more redness, swelling, or pain around an incision.  You have more fluid or blood coming from an incision.  Your incision feels warm to the touch.  You have a fever.  You have pain or swelling in your knee, and it gets worse.  You have pain that does not get better with medicine. Get help right away if:  You have trouble  breathing.  You have pus or a bad smell coming from an incision.  You have numbness and tingling near the knee joint. Summary  After the procedure, it is common to have knee pain with bruising and swelling on your knee, calf, and ankle.  Icing your knee and raising your leg above the level of your heart will help control the pain and the swelling.  Do physical therapy exercises as told by your health care provider. Physical therapy will help you regain strength and motion in your knee. This information is not intended to replace advice given to you by your health care provider. Make sure you discuss any questions you have with your health care provider. Document Released: 02/11/2013 Document  Revised: 04/05/2017 Document Reviewed: 04/17/2016 Elsevier Patient Education  2020 [place] Anesthesia, Adult, Care After This sheet gives you information about how to care for yourself after your procedure. Your health care provider may also give you more specific instructions. If you have problems or questions, contact your health care provider. What can I expect after the procedure? After the procedure, the following side effects are common:  Pain or discomfort at the IV site.  Nausea.  Vomiting.  Sore throat.  Trouble concentrating.  Feeling cold or chills.  Weak or tired.  Sleepiness and fatigue.  Soreness and body aches. These side effects can affect parts of the body that were not involved in surgery. Follow these instructions at home:  For at least 24 hours after the procedure:  Have a responsible adult stay with you. It is important to have someone help care for you until you are awake and alert.  Rest as needed.  Do not: ? Participate in activities in which you could fall or become injured. ? Drive. ? Use heavy machinery. ? Drink alcohol. ? Take sleeping pills or medicines that cause drowsiness. ? Make important decisions or sign legal documents. ?  Take care of children on your own. Eating and drinking  Follow any instructions from your health care provider about eating or drinking restrictions.  When you feel hungry, start by eating small amounts of foods that are soft and easy to digest (bland), such as toast. Gradually return to your regular diet.  Drink enough fluid to keep your urine pale yellow.  If you vomit, rehydrate by drinking water, juice, or clear broth. General instructions  If you have sleep apnea, surgery and certain medicines can increase your risk for breathing problems. Follow instructions from your health care provider about wearing your sleep device: ? Anytime you are sleeping, including during daytime naps. ? While taking prescription pain medicines, sleeping medicines, or medicines that make you drowsy.  Return to your normal activities as told by your health care provider. Ask your health care provider what activities are safe for you.  Take over-the-counter and prescription medicines only as told by your health care provider.  If you smoke, do not smoke without supervision.  Keep all follow-up visits as told by your health care provider. This is important. Contact a health care provider if:  You have nausea or vomiting that does not get better with medicine.  You cannot eat or drink without vomiting.  You have pain that does not get better with medicine.  You are unable to pass urine.  You develop a skin rash.  You have a fever.  You have redness around your IV site that gets worse. Get help right away if:  You have difficulty breathing.  You have chest pain.  You have blood in your urine or stool, or you vomit blood. Summary  After the procedure, it is common to have a sore throat or nausea. It is also common to feel tired.  Have a responsible adult stay with you for the first 24 hours after general anesthesia. It is important to have someone help care for you until you are awake and  alert.  When you feel hungry, start by eating small amounts of foods that are soft and easy to digest (bland), such as toast. Gradually return to your regular diet.  Drink enough fluid to keep your urine pale yellow.  Return to your normal activities as told by your health care provider. Ask  your health care provider what activities are safe for you. This information is not intended to replace advice given to you by your health care provider. Make sure you discuss any questions you have with your health care provider. Document Released: 07/30/2000 Document Revised: 04/26/2017 Document Reviewed: 12/07/2016 Elsevier Patient Education  2020 [employer]. How to Use Chlorhexidine for Bathing Chlorhexidine gluconate (CHG) is a germ-killing (antiseptic) solution that is used to clean the skin. It can get rid of the bacteria that normally live on the skin and can keep them away for about 24 hours. To clean your skin with CHG, you may be given:  A CHG solution to use in the shower or as part of a sponge bath.  A prepackaged cloth that contains CHG. Cleaning your skin with CHG may help lower the risk for infection:  While you are staying in the intensive care unit of the hospital.  If you have a vascular access, such as a central line, to provide short-term or long-term access to your veins.  If you have a catheter to drain urine from your bladder.  If you are on a ventilator. A ventilator is a machine that helps you breathe by moving air in and out of your lungs.  After surgery. What are the risks? Risks of using CHG include:  A skin reaction.  Hearing loss, if CHG gets in your ears.  Eye injury, if CHG gets in your eyes and is not rinsed out.  The CHG product catching fire. Make sure that you avoid smoking and flames after applying CHG to your skin. Do not use CHG:  If you have a chlorhexidine allergy or have previously reacted to chlorhexidine.  On babies younger than 2 months of  age. How to use CHG solution  Use CHG only as told by your health care provider, and follow the instructions on the label.  Use the full amount of CHG as directed. Usually, this is one bottle. During a shower Follow these steps when using CHG solution during a shower (unless your health care provider gives you different instructions): 1. Start the shower. 2. Use your normal soap and shampoo to wash your face and hair. 3. Turn off the shower or move out of the shower stream. 4. Pour the CHG onto a clean washcloth. Do not use any type of brush or rough-edged sponge. 5. Starting at your neck, lather your body down to your toes. Make sure you follow these instructions: ? If you will be having surgery, pay special attention to the part of your body where you will be having surgery. Scrub this area for at least 1 minute. ? Do not use CHG on your head or face. If the solution gets into your ears or eyes, rinse them well with water. ? Avoid your genital area. ? Avoid any areas of skin that have broken skin, cuts, or scrapes. ? Scrub your back and under your arms. Make sure to wash skin folds. 6. Let the lather sit on your skin for 1-2 minutes or as long as told by your health care provider. 7. Thoroughly rinse your entire body in the shower. Make sure that all body creases and crevices are rinsed well. 8. Dry off with a clean towel. Do not put any substances on your body afterward-such as powder, lotion, or perfume-unless you are told to do so by your health care provider. Only use lotions that are recommended by the manufacturer. 9. Put on clean clothes or pajamas. 10.  If it is the night before your surgery, sleep in clean sheets.  During a sponge bath Follow these steps when using CHG solution during a sponge bath (unless your health care provider gives you different instructions): 1. Use your normal soap and shampoo to wash your face and hair. 2. Pour the CHG onto a clean washcloth. 3.  Starting at your neck, lather your body down to your toes. Make sure you follow these instructions: ? If you will be having surgery, pay special attention to the part of your body where you will be having surgery. Scrub this area for at least 1 minute. ? Do not use CHG on your head or face. If the solution gets into your ears or eyes, rinse them well with water. ? Avoid your genital area. ? Avoid any areas of skin that have broken skin, cuts, or scrapes. ? Scrub your back and under your arms. Make sure to wash skin folds. 4. Let the lather sit on your skin for 1-2 minutes or as long as told by your health care provider. 5. Using a different clean, wet washcloth, thoroughly rinse your entire body. Make sure that all body creases and crevices are rinsed well. 6. Dry off with a clean towel. Do not put any substances on your body afterward-such as powder, lotion, or perfume-unless you are told to do so by your health care provider. Only use lotions that are recommended by the manufacturer. 7. Put on clean clothes or pajamas. 8. If it is the night before your surgery, sleep in clean sheets. How to use CHG prepackaged cloths  Only use CHG cloths as told by your health care provider, and follow the instructions on the label.  Use the CHG cloth on clean, dry skin.  Do not use the CHG cloth on your head or face unless your health care provider tells you to.  When washing with the CHG cloth: ? Avoid your genital area. ? Avoid any areas of skin that have broken skin, cuts, or scrapes. Before surgery Follow these steps when using a CHG cloth to clean before surgery (unless your health care provider gives you different instructions): 1. Using the CHG cloth, vigorously scrub the part of your body where you will be having surgery. Scrub using a back-and-forth motion for 3 minutes. The area on your body should be completely wet with CHG when you are done scrubbing. 2. Do not rinse. Discard the cloth and let  the area air-dry. Do not put any substances on the area afterward, such as powder, lotion, or perfume. 3. Put on clean clothes or pajamas. 4. If it is the night before your surgery, sleep in clean sheets.  For general bathing Follow these steps when using CHG cloths for general bathing (unless your health care provider gives you different instructions). 1. Use a separate CHG cloth for each area of your body. Make sure you wash between any folds of skin and between your fingers and toes. Wash your body in the following order, switching to a new cloth after each step: ? The front of your neck, shoulders, and chest. ? Both of your arms, under your arms, and your hands. ? Your stomach and groin area, avoiding the genitals. ? Your right leg and foot. ? Your left leg and foot. ? The back of your neck, your back, and your buttocks. 2. Do not rinse. Discard the cloth and let the area air-dry. Do not put any substances on your body afterward-such as powder,  lotion, or perfume-unless you are told to do so by your health care provider. Only use lotions that are recommended by the manufacturer. 3. Put on clean clothes or pajamas. Contact a health care provider if:  Your skin gets irritated after scrubbing.  You have questions about using your solution or cloth. Get help right away if:  Your eyes become very red or swollen.  Your eyes itch badly.  Your skin itches badly and is red or swollen.  Your hearing changes.  You have trouble seeing.  You have swelling or tingling in your mouth or throat.  You have trouble breathing.  You swallow any chlorhexidine. Summary  Chlorhexidine gluconate (CHG) is a germ-killing (antiseptic) solution that is used to clean the skin. Cleaning your skin with CHG may help to lower your risk for infection.  You may be given CHG to use for bathing. It may be in a bottle or in a prepackaged cloth to use on your skin. Carefully follow your health care provider's  instructions and the instructions on the product label.  Do not use CHG if you have a chlorhexidine allergy.  Contact your health care provider if your skin gets irritated after scrubbing. This information is not intended to replace advice given to you by your health care provider. Make sure you discuss any questions you have with your health care provider. Document Released: 01/16/2012 Document Revised: 07/10/2018 Document Reviewed: 03/21/2017 Elsevier Patient Education  2020 [employer].

## 2018-11-03 NOTE — Telephone Encounter (Signed)
Hydrocodone-Acetaminophen  5/325 mg  Qty 28 Tablets  Take 1 tablet by mouth every 6 (six) hours as needed for moderate pain.   PATIENT USES [facility]

## 2018-11-10 ENCOUNTER — Other Ambulatory Visit: Payer: Self-pay

## 2018-11-10 ENCOUNTER — Other Ambulatory Visit (HOSPITAL_COMMUNITY)
Admission: RE | Admit: 2018-11-10 | Discharge: 2018-11-10 | Disposition: A | Discharge status: Home / Self Care | Payer: Medicare HMO | Source: Ambulatory Visit | Attending: Orthopedic Surgery | Admitting: Orthopedic Surgery

## 2018-11-10 ENCOUNTER — Encounter (HOSPITAL_COMMUNITY)
Admission: RE | Admit: 2018-11-10 | Discharge: 2018-11-10 | Disposition: A | Discharge status: Home / Self Care | Payer: Medicare HMO | Source: Ambulatory Visit | Attending: Orthopedic Surgery | Admitting: Orthopedic Surgery

## 2018-11-10 ENCOUNTER — Encounter (HOSPITAL_COMMUNITY): Payer: Self-pay

## 2018-11-13 ENCOUNTER — Ambulatory Visit (HOSPITAL_COMMUNITY): Admission: RE | Admit: 2018-11-13 | Payer: Medicare HMO | Source: Ambulatory Visit | Admitting: Orthopedic Surgery

## 2018-11-13 ENCOUNTER — Encounter (HOSPITAL_COMMUNITY): Admission: RE | Payer: Self-pay | Source: Ambulatory Visit

## 2018-11-13 SURGERY — REPAIR, TENDON, PATELLAR, ARTHROSCOPIC
Anesthesia: Choice | Laterality: Left

## 2018-11-27 ENCOUNTER — Other Ambulatory Visit (HOSPITAL_COMMUNITY): Payer: Self-pay | Admitting: Family Medicine

## 2018-11-27 DIAGNOSIS — Z1231 Encounter for screening mammogram for malignant neoplasm of breast: Secondary | ICD-10-CM

## 2018-12-17 ENCOUNTER — Ambulatory Visit (HOSPITAL_COMMUNITY): Payer: Medicare HMO

## 2019-04-22 ENCOUNTER — Ambulatory Visit (INDEPENDENT_AMBULATORY_CARE_PROVIDER_SITE_OTHER): Payer: Medicare HMO | Admitting: Orthopedic Surgery

## 2019-04-22 ENCOUNTER — Other Ambulatory Visit: Payer: Self-pay

## 2019-04-22 ENCOUNTER — Ambulatory Visit: Payer: Medicare HMO

## 2019-04-22 VITALS — BP 172/102 | HR 86 | Temp 97.0°F | Ht 62.0 in | Wt 139.0 lb

## 2019-04-22 DIAGNOSIS — F17210 Nicotine dependence, cigarettes, uncomplicated: Secondary | ICD-10-CM

## 2019-04-22 DIAGNOSIS — M25561 Pain in right knee: Secondary | ICD-10-CM

## 2019-04-22 DIAGNOSIS — G8929 Other chronic pain: Secondary | ICD-10-CM

## 2019-04-22 DIAGNOSIS — M25361 Other instability, right knee: Secondary | ICD-10-CM

## 2019-04-22 MED ORDER — HYDROCODONE-ACETAMINOPHEN 7.5-325 MG PO TABS: 1.0000 | ORAL_TABLET | Freq: Three times a day (TID) | ORAL | 0 refills | Status: DC | PRN

## 2019-04-22 NOTE — Patient Instructions (Addendum)
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 They may resolve your symptoms with or without further involvement from your physician, physical therapist, or athletic trainer. While completing these exercises, remember:   Muscles can gain both the endurance and the strength needed for everyday activities through controlled exercises.  Complete these exercises as instructed by your physician, physical therapist, or athletic trainer. Progress the resistance and repetitions only as guided.  You may experience muscle soreness or fatigue, but the pain or discomfort you are trying to eliminate should never worsen during these exercises. If this pain does worsen, stop and make certain you are following the directions exactly. If the pain is still present after adjustments, discontinue the exercise until you can discuss the trouble with your clinician. STRENGTH - Quadriceps, Isometrics  Lie on your back with your right / left leg extended and your opposite knee bent.  Gradually tense the muscles in the front of your right / left thigh. You should see either your knee cap slide up toward your hip or increased dimpling just above the knee. This motion will push the back of the knee down toward the floor/mat/bed on which you are lying.  Relax the muscles slowly and completely in between each repetition. STRENGTH - Quadriceps, Short Arcs   Lie on your back. Place a rolled under your knee so that the knee slightly bends.  Raise  only your lower leg by tightening the muscles in the front of your thigh. Do not allow your thigh to rise. STRENGTH - Quadriceps, Straight Leg Raises  Quality counts! Watch for signs that the quadriceps muscle is working to insure you are strengthening the correct muscles and not "cheating" by substituting with healthier muscles.  Lay on your back with your right / left leg extended and your opposite knee bent.  Tense the muscles in the front of your right / left thigh. You should see either your knee cap slide up or increased dimpling just above the knee. Your thigh may even quiver.  Tighten these muscles even more and raise your leg 4 to 6 inches off the floor. Keeping these muscles tense, lower your leg.  Relax the muscles slowly and completely in between each repetition. STRENGTH - Hamstring, Curls  Lay on your stomach with your legs extended. (If you lay on a bed, your feet may hang over the edge.)  Tighten the muscles in the back of your thigh to bend your right / left knee up to 90 degrees. Keep your hips flat on the bed/floor.  Slowly lower your leg back to the starting position. STRENGTH - Quadriceps, Squats  Stand in a door frame so that your feet and knees are in line with the frame.  Use your hands for balance, not support, on the frame.  Slowly lower your weight, bending at the hips and knees. Keep your lower legs upright so that they are parallel with the door frame. Squat only within the range that does not increase your  knee pain. Never let your hips drop below your knees.  Slowly return upright, pushing with your legs, not pulling with your hands. STRENGTH - Quadriceps, Wall Slides  Follow guidelines for form closely. Increased knee pain often results from poorly placed feet or knees.  Lean against a smooth wall or door and walk your feet out 18-24 inches. Place your feet hip-width apart.  Slowly slide down the wall or door until your knees bend ___30______ degrees.*  Keep your knees over your heels, not your toes, and in line with your hips, not falling to either side.

## 2019-04-22 NOTE — Progress Notes (Signed)
Karen Mercer Parveen  04/22/2019  Body mass index is 25.42 kg/m.   HISTORY SECTION :  Chief Complaint  Patient presents with  . Follow-up    Recheck on right leg pain.  . Knee Pain    right knee painful at kneecap   Kamyrah had patellofemoral realignment surgery probably 10 years ago she has had chronic intermittent lower back pain with right radicular leg pain for several years as well  She had back surgery  She comes in today with 2 to 3-week period of increasing pain swelling and throbbing right knee with popping feeling like the kneecap is moving.  She says she cannot sleep at night.      Review of Systems  Constitutional: Negative for chills, fever and weight loss.  Musculoskeletal: Positive for joint pain.  Neurological: Positive for tingling.     Social History   Socioeconomic History  . Marital status: Single    Spouse name: Not on file  . Number of children: Not on file  . Years of education: Not on file  . Highest education level: Not on file  Occupational History  . Occupation: Glass blower/designer   Tobacco Use  . Smoking status: Current Every Day Smoker    Packs/day: 1.00    Years: 20.00    Pack years: 20.00    Types: Cigarettes  . Smokeless tobacco: Never Used  Substance and Sexual Activity  . Alcohol use: Yes    Comment: OCCASIONALLY   . Drug use: No  . Sexual activity: Not Currently    Birth control/protection: Surgical    Comment: tubal  Other Topics Concern  . Not on file  Social History Narrative  . Not on file   Social Determinants of Health      has a past medical history of Chronic back pain, Complication of anesthesia, Muscle spasm, Neuromuscular disorder ([place]), PONV (postoperative nausea and vomiting), and Rheumatoid arthritis ([place]).   Past Surgical History:  Procedure Laterality Date  . BACK SURGERY     at surgical center  . KNEE ARTHROSCOPY  08/17/2011   Procedure: ARTHROSCOPY KNEE;  Surgeon: Carole Civil, MD;  Location:  [facility];  Service: Orthopedics;  Laterality: Right;  with tibia tubercle transfer and proximal realignment  . KNEE ARTHROSCOPY WITH FULKERSON SLIDE Left 08/29/2012   Procedure: Arthroscopic lateral release with chondroplasty left knee;  Surgeon: Carole Civil, MD;  Location: [facility];  Service: Orthopedics;  Laterality: Left;  . KNEE ARTHROSCOPY WITH MEDIAL PATELLAR FEMORAL LIGAMENT RECONSTRUCTION Left 08/29/2012   Procedure: Open proximal realignment fulkerson;  Surgeon: Carole Civil, MD;  Location: [facility];  Service: Orthopedics;  Laterality: Left;  . KNEE SURGERY  2012   right knee-arthroscopy  . LUMBAR LAMINECTOMY/DECOMPRESSION MICRODISCECTOMY Right 06/11/2014   Procedure: Right L4-5 Lumbar diskectomy;  Surgeon: Floyce Stakes, MD;  Location: [facility];  Service: Neurosurgery;  Laterality: Right;  Right L4-5 Lumbar diskectomy  . right arm  2008   rods from fractured arm  . TUBAL LIGATION  1995    Body mass index is 25.42 kg/m.   Allergies  Allergen Reactions  . Tramadol Itching and Nausea Only     Current Outpatient Medications:  .  HYDROcodone-acetaminophen (NORCO/VICODIN) 5-325 MG tablet, Take 1 tablet by mouth every 6 (six) hours as needed for moderate pain. (Patient not taking: Reported on 04/22/2019), Disp: 28 tablet, Rfl: 0   PHYSICAL EXAM SECTION: 1) BP (!) 172/102   Pulse 86   Temp (!) 97  F (36.1 C)   Ht 5' 2"  (1.575 m)   Wt 139 lb (63 kg)   BMI 25.42 kg/m   Body mass index is 25.42 kg/m. General appearance: Well-developed well-nourished no gross deformities  2) Cardiovascular normal pulse and perfusion in the lower extremities normal color without edema  3) Neurologically deep tendon reflexes are equal and normal, no sensation loss or deficits no pathologic reflexes  4) Psychological: Awake alert and oriented x3 mood and affect normal  5) Skin no lacerations or ulcerations no nodularity no palpable masses, no erythema or nodularity  6)  Musculoskeletal:   Right knee:  Proximal medial scar and lateral distal scar from patellofemoral proximal and distal realignment surgery.  Knees are hypersensitive typical for this patient population  She has full range of motion she does have a joint effusion and her patella is exhibits the J sign in extension but her medial and lateral patellar glide tests are normal.  At 30 degrees and up she is in the patellar groove and centered in tracking  Does seem to have some quadriceps weakness.  ACL PCL and collateral ligaments otherwise stable.     MEDICAL DECISION SECTION:  Encounter Diagnosis  Name Primary?  . Chronic pain of right knee Yes    Imaging In-house x-rays right knee prior tibial tubercle transfer internal fixation looks normal.  Mild narrowing of the medial compartment patellofemoral joint is centered no significant degenerative arthritis in the patellofemoral region  Plan:  (Rx., Inj., surg., Frx, MRI/CT, XR:2)  Aspiration injection right knee Patellofemoral brace Patellofemoral stabilizing exercises 7 days pain meds  Procedure note injection and aspiration right knee joint  Verbal consent was obtained to aspirate and inject the right knee joint   Timeout was completed to confirm the site of aspiration and injection  An 18-gauge needle was used to aspirate the knee joint from a suprapatellar lateral approach.  The medications used were 40 mg of Depo-Medrol and 1% lidocaine 3 cc  Anesthesia was provided by ethyl chloride and the skin was prepped with alcohol.  After cleaning the skin with alcohol an 18-gauge needle was used to aspirate the right knee joint.  We obtained 16  cc of fluid clear  We follow this by injection of 40 mg of Depo-Medrol and 3 cc 1% lidocaine.  There were no complications. A sterile bandage was applied.   F/u prn    3:17 PM Arther Abbott, MD  04/22/2019

## 2019-04-28 ENCOUNTER — Other Ambulatory Visit: Payer: Self-pay | Admitting: Orthopedic Surgery

## 2019-04-28 NOTE — Telephone Encounter (Signed)
Patient requests refill on Hydrocodone/Acetaminophen 7.5-325  Mgs.  Qty  21  Sig: Take 1 tablet by mouth every 8 (eight) hours as needed for up to 7 days for moderate pain.  Patient states she uses [facility]

## 2019-04-29 MED ORDER — HYDROCODONE-ACETAMINOPHEN 7.5-325 MG PO TABS: 1.0000 | ORAL_TABLET | Freq: Three times a day (TID) | ORAL | 0 refills | Status: DC | PRN

## 2019-05-06 ENCOUNTER — Telehealth: Payer: Self-pay | Admitting: Orthopedic Surgery

## 2019-05-06 ENCOUNTER — Other Ambulatory Visit: Payer: Self-pay | Admitting: Orthopedic Surgery

## 2019-05-06 MED ORDER — HYDROCODONE-ACETAMINOPHEN 7.5-325 MG PO TABS: 1.0000 | ORAL_TABLET | Freq: Three times a day (TID) | ORAL | 0 refills | Status: AC | PRN

## 2019-05-06 NOTE — Telephone Encounter (Signed)
Patient called to (1) request refill - seen in office 04/29/19 HYDROcodone-acetaminophen (NORCO) 7.5-325 MG tablet 21 tablet  - [facility]   (2) asked for another appointment. Scheduled for next available.

## 2019-05-06 NOTE — Telephone Encounter (Signed)
Keep appointment as scheduled. Thanks

## 2019-05-06 NOTE — Telephone Encounter (Signed)
Too early for another appointment    Medication will be sent in

## 2019-05-06 NOTE — Telephone Encounter (Signed)
Called patient; aware.

## 2019-05-13 ENCOUNTER — Other Ambulatory Visit: Payer: Self-pay | Admitting: Orthopedic Surgery

## 2019-05-13 NOTE — Telephone Encounter (Signed)
Patient requests refill on Hydrocodone/Acetaminophen 7.5-325  Mgs.  Qty  21  Sig: Take 1 tablet by mouth every 8 (eight) hours as needed for up to 7 days for moderate pain.  Patient states she uses [facility]

## 2019-05-14 ENCOUNTER — Other Ambulatory Visit: Payer: Self-pay | Admitting: Orthopedic Surgery

## 2019-05-14 ENCOUNTER — Telehealth: Payer: Self-pay | Admitting: Orthopedic Surgery

## 2019-05-14 DIAGNOSIS — G8929 Other chronic pain: Secondary | ICD-10-CM

## 2019-05-14 MED ORDER — GABAPENTIN 300 MG PO CAPS: 300.0000 mg | ORAL_CAPSULE | Freq: Three times a day (TID) | ORAL | 5 refills | Status: DC

## 2019-05-14 MED ORDER — PREDNISONE 10 MG (48) PO TBPK: ORAL_TABLET | Freq: Every day | ORAL | 0 refills | Status: DC

## 2019-05-14 NOTE — Telephone Encounter (Signed)
Yes   I ll send in some non opioid meds

## 2019-05-14 NOTE — Telephone Encounter (Signed)
Patient called back regarding her refill request - chart note indicates refused, which I relayed to patient. States her appointment is not until 05/27/19; asking if there is anything else she can do.

## 2019-05-14 NOTE — Telephone Encounter (Signed)
Thanks I called her to advise.

## 2019-05-27 ENCOUNTER — Encounter: Payer: Self-pay | Admitting: Orthopedic Surgery

## 2019-05-27 ENCOUNTER — Other Ambulatory Visit: Payer: Self-pay

## 2019-05-27 ENCOUNTER — Ambulatory Visit (INDEPENDENT_AMBULATORY_CARE_PROVIDER_SITE_OTHER): Payer: Medicare HMO | Admitting: Orthopedic Surgery

## 2019-05-27 VITALS — BP 151/96 | HR 77 | Ht 62.0 in | Wt 139.0 lb

## 2019-05-27 DIAGNOSIS — M25561 Pain in right knee: Secondary | ICD-10-CM | POA: Diagnosis not present

## 2019-05-27 DIAGNOSIS — G8929 Other chronic pain: Secondary | ICD-10-CM

## 2019-05-27 DIAGNOSIS — M2241 Chondromalacia patellae, right knee: Secondary | ICD-10-CM | POA: Diagnosis not present

## 2019-05-27 DIAGNOSIS — M25361 Other instability, right knee: Secondary | ICD-10-CM | POA: Diagnosis not present

## 2019-05-27 MED ORDER — HYDROCODONE-ACETAMINOPHEN 5-325 MG PO TABS: 1.0000 | ORAL_TABLET | Freq: Four times a day (QID) | ORAL | 0 refills | Status: DC | PRN

## 2019-05-27 NOTE — Progress Notes (Signed)
Chief Complaint  Patient presents with  . Knee Pain    right     History 49 year old female previous patellofemoral realignment surgery including tibial tubercle presents with chronic pain right knee having trouble sleeping at night.  She says she can go up the stairs okay but when she tries to go down the stairs she feels increased pain and crepitance in the knee.  She is not having giving out symptoms catching or locking but feels like the knee could give out at any time    She actually says she wanted to have a total knee.  We talked about that as well and that is not a good option with her age history of back problems radicular symptoms in the right leg  She did try bracing ejection ice heat anti-inflammatories steroids with no relief  Review of systems history of lower back pain right leg radiculopathy sensory changes in the lower extremities from the back issue  No fever or chills  Past Medical History:  Diagnosis Date  . Chronic back pain    both legs;DDD and stenosis   . Complication of anesthesia    had extreme headache after last back surgery - was in pacu for extended period  . Muscle spasm    takes Valium daily as needed  . Neuromuscular disorder ([place])   . PONV (postoperative nausea and vomiting)   . Rheumatoid arthritis [facility])    Past Surgical History:  Procedure Laterality Date  . BACK SURGERY     at surgical center  . KNEE ARTHROSCOPY  08/17/2011   Procedure: ARTHROSCOPY KNEE;  Surgeon: Carole Civil, MD;  Location: [facility];  Service: Orthopedics;  Laterality: Right;  with tibia tubercle transfer and proximal realignment  . KNEE ARTHROSCOPY WITH FULKERSON SLIDE Left 08/29/2012   Procedure: Arthroscopic lateral release with chondroplasty left knee;  Surgeon: Carole Civil, MD;  Location: [facility];  Service: Orthopedics;  Laterality: Left;  . KNEE ARTHROSCOPY WITH MEDIAL PATELLAR FEMORAL LIGAMENT RECONSTRUCTION Left 08/29/2012   Procedure: Open proximal  realignment fulkerson;  Surgeon: Carole Civil, MD;  Location: [facility];  Service: Orthopedics;  Laterality: Left;  . KNEE SURGERY  2012   right knee-arthroscopy  . LUMBAR LAMINECTOMY/DECOMPRESSION MICRODISCECTOMY Right 06/11/2014   Procedure: Right L4-5 Lumbar diskectomy;  Surgeon: Floyce Stakes, MD;  Location: [place] [facility];  Service: Neurosurgery;  Laterality: Right;  Right L4-5 Lumbar diskectomy  . right arm  2008   rods from fractured arm  . TUBAL LIGATION  1995    BP (!) 151/96   Pulse 77   Ht 5' 2"  (1.575 m)   Wt 139 lb (63 kg)   BMI 25.42 kg/m   Awake and alert oriented x3 mood and affect normal body habitus normal  Right knee hyperextends 15 degrees.  She has a scar on the medial side of the knee from the patellofemoral realignment surgery and then the distal scar for the tibial tubercle procedure  There is a joint effusion she has crepitance and tenderness of the patella facets medially and laterally with positive grind test positive contraction test medial lateral glide tests show looseness of the patella especially laterally  Tracking shows a J sign at 30 degrees she is back in the groove  Pseudolaxity of the knee joint is noted from the ligament laxity syndrome  Full range of motion flexion is noted collateral ligaments are stable  Vascular exam is intact   Encounter Diagnoses  Name Primary?  . Chronic  pain of right knee   . Chondromalacia, patella, right Yes  . Patellar instability of right knee    Meds ordered this encounter  Medications  . HYDROcodone-acetaminophen (NORCO/VICODIN) 5-325 MG tablet    Sig: Take 1 tablet by mouth every 6 (six) hours as needed for moderate pain.    Dispense:  30 tablet    Refill:  0    Chronic problem with exacerbation, surgery planned, prescription management

## 2019-05-27 NOTE — Patient Instructions (Signed)
Surgery will be scheduled for your need to have arthroscopic surgery and try to clean up some of the arthritis  You can take hydrocodone until the surgery

## 2019-06-04 ENCOUNTER — Telehealth: Payer: Self-pay | Admitting: Radiology

## 2019-06-04 NOTE — Telephone Encounter (Signed)
Submitted prior authorization request on [place].

## 2019-06-05 NOTE — Patient Instructions (Signed)
Karen Mercer  06/05/2019     @PREFPERIOPPHARMACY @   Your procedure is scheduled on  06/11/2019  Report to [facility] at  [place].  Call this number if you have problems the morning of surgery:  [phone]   Remember:  Do not eat or drink after midnight.      Take these medicines the morning of surgery with A SIP OF WATER  Gabapentin, hydrocodone(if needed).    Do not wear jewelry, make-up or nail polish.  Do not wear lotions, powders, or perfumes. Please wear deodorant and brush your teeth.  Do not shave 48 hours prior to surgery.  Men may shave face and neck.  Do not bring valuables to the hospital.  [facility] is not responsible for any belongings or valuables.  Contacts, dentures or bridgework may not be worn into surgery.  Leave your suitcase in the car.  After surgery it may be brought to your room.  For patients admitted to the hospital, discharge time will be determined by your treatment team.  Patients discharged the day of surgery will not be allowed to drive home.   Name and phone number of your driver:   family Special instructions:  None  Please read over the following fact sheets that you were given. Anesthesia Post-op Instructions and Care and Recovery After Surgery       Arthroscopic Knee Ligament Repair, Care After This sheet gives you information about how to care for yourself after your procedure. Your health care provider may also give you more specific instructions. If you have problems or questions, contact your health care provider. What can I expect after the procedure? After the procedure, it is common to have:  Pain in your knee.  Bruising and swelling on your knee, calf, and ankle for 3-4 days.  Fatigue. Follow these instructions at home: If you have a brace or immobilizer:  Wear the brace or immobilizer as told by your health care provider. Remove it only as told by your health care provider.  Loosen the splint or  immobilizer if your toes tingle, become numb, or turn cold and blue.  Keep the brace or immobilizer clean. Bathing  Do not take baths, swim, or use a hot tub until your health care provider approves. Ask your health care provider if you can take showers.  Keep your bandage (dressing) dry until your health care provider says that it can be removed. Cover it and your brace or immobilizer with a watertight covering when you take a shower. Incision care   Follow instructions from your health care provider about how to take care of your incision. Make sure you: ? Wash your hands with soap and water before you change your bandage (dressing). If soap and water are not available, use hand sanitizer. ? Change your dressing as told by your health care provider. ? Leave stitches (sutures), skin glue, or adhesive strips in place. These skin closures may need to stay in place for 2 weeks or longer. If adhesive strip edges start to loosen and curl up, you may trim the loose edges. Do not remove adhesive strips completely unless your health care provider tells you to do that.  Check your incision area every day for signs of infection. Check for: ? More redness, swelling, or pain. ? More fluid or blood. ? Warmth. ? Pus or a bad smell. Managing pain, stiffness, and swelling   If directed, put ice  on the affected area. ? If you have a removable brace or immobilizer, remove it as told by your health care provider. ? Put ice in a plastic bag. ? Place a towel between your skin and the bag or between your brace or immobilizer and the bag. ? Leave the ice on for 20 minutes, 2-3 times a day.  Move your toes often to avoid stiffness and to lessen swelling.  Raise (elevate) the injured area above the level of your heart while you are sitting or lying down. Driving  Do not drive until your health care provider approves. If you have a brace or immobilizer on your leg, ask your health care provider when it is  safe for you to drive.  Do not drive or use heavy machinery while taking prescription pain medicine. Activity  Rest as directed. Ask your health care provider what activities are safe for you.  Do physical therapy exercises as told by your health care provider. Physical therapy will help you regain strength and motion in your knee.  Follow instructions from your health care provider about: ? When you may start motion exercises. ? When you may start riding a stationary bike and doing other low-impact activities. ? When you may start to jog and do other high-impact activities. Safety  Do not use the injured limb to support your body weight until your health care provider says that you can. Use crutches as told by your health care provider. General instructions  Do not use any products that contain nicotine or tobacco, such as cigarettes and e-cigarettes. These can delay bone healing. If you need help quitting, ask your health care provider.  To prevent or treat constipation while you are taking prescription pain medicine, your health care provider may recommend that you: ? Drink enough fluid to keep your urine clear or pale yellow. ? Take over-the-counter or prescription medicines. ? Eat foods that are high in fiber, such as fresh fruits and vegetables, whole grains, and beans. ? Limit foods that are high in fat and processed sugars, such as fried and sweet foods.  Take over-the-counter and prescription medicines only as told by your health care provider.  Keep all follow-up visits as told by your health care provider. This is important. Contact a health care provider if:  You have more redness, swelling, or pain around an incision.  You have more fluid or blood coming from an incision.  Your incision feels warm to the touch.  You have a fever.  You have pain or swelling in your knee, and it gets worse.  You have pain that does not get better with medicine. Get help right away  if:  You have trouble breathing.  You have pus or a bad smell coming from an incision.  You have numbness and tingling near the knee joint. Summary  After the procedure, it is common to have knee pain with bruising and swelling on your knee, calf, and ankle.  Icing your knee and raising your leg above the level of your heart will help control the pain and the swelling.  Do physical therapy exercises as told by your health care provider. Physical therapy will help you regain strength and motion in your knee. This information is not intended to replace advice given to you by your health care provider. Make sure you discuss any questions you have with your health care provider. Document Revised: 04/05/2017 Document Reviewed: 04/17/2016 Elsevier Patient Education  2020 [place] Anesthesia, Adult,  Care After This sheet gives you information about how to care for yourself after your procedure. Your health care provider may also give you more specific instructions. If you have problems or questions, contact your health care provider. What can I expect after the procedure? After the procedure, the following side effects are common:  Pain or discomfort at the IV site.  Nausea.  Vomiting.  Sore throat.  Trouble concentrating.  Feeling cold or chills.  Weak or tired.  Sleepiness and fatigue.  Soreness and body aches. These side effects can affect parts of the body that were not involved in surgery. Follow these instructions at home:  For at least 24 hours after the procedure:  Have a responsible adult stay with you. It is important to have someone help care for you until you are awake and alert.  Rest as needed.  Do not: ? Participate in activities in which you could fall or become injured. ? Drive. ? Use heavy machinery. ? Drink alcohol. ? Take sleeping pills or medicines that cause drowsiness. ? Make important decisions or sign legal documents. ? Take care  of children on your own. Eating and drinking  Follow any instructions from your health care provider about eating or drinking restrictions.  When you feel hungry, start by eating small amounts of foods that are soft and easy to digest (bland), such as toast. Gradually return to your regular diet.  Drink enough fluid to keep your urine pale yellow.  If you vomit, rehydrate by drinking water, juice, or clear broth. General instructions  If you have sleep apnea, surgery and certain medicines can increase your risk for breathing problems. Follow instructions from your health care provider about wearing your sleep device: ? Anytime you are sleeping, including during daytime naps. ? While taking prescription pain medicines, sleeping medicines, or medicines that make you drowsy.  Return to your normal activities as told by your health care provider. Ask your health care provider what activities are safe for you.  Take over-the-counter and prescription medicines only as told by your health care provider.  If you smoke, do not smoke without supervision.  Keep all follow-up visits as told by your health care provider. This is important. Contact a health care provider if:  You have nausea or vomiting that does not get better with medicine.  You cannot eat or drink without vomiting.  You have pain that does not get better with medicine.  You are unable to pass urine.  You develop a skin rash.  You have a fever.  You have redness around your IV site that gets worse. Get help right away if:  You have difficulty breathing.  You have chest pain.  You have blood in your urine or stool, or you vomit blood. Summary  After the procedure, it is common to have a sore throat or nausea. It is also common to feel tired.  Have a responsible adult stay with you for the first 24 hours after general anesthesia. It is important to have someone help care for you until you are awake and  alert.  When you feel hungry, start by eating small amounts of foods that are soft and easy to digest (bland), such as toast. Gradually return to your regular diet.  Drink enough fluid to keep your urine pale yellow.  Return to your normal activities as told by your health care provider. Ask your health care provider what activities are safe for you. This information is not intended to  replace advice given to you by your health care provider. Make sure you discuss any questions you have with your health care provider. Document Revised: 04/26/2017 Document Reviewed: 12/07/2016 Elsevier Patient Education  [place]. How to Use Chlorhexidine for Bathing Chlorhexidine gluconate (CHG) is a germ-killing (antiseptic) solution that is used to clean the skin. It can get rid of the bacteria that normally live on the skin and can keep them away for about 24 hours. To clean your skin with CHG, you may be given:  A CHG solution to use in the shower or as part of a sponge bath.  A prepackaged cloth that contains CHG. Cleaning your skin with CHG may help lower the risk for infection:  While you are staying in the intensive care unit of the hospital.  If you have a vascular access, such as a central line, to provide short-term or long-term access to your veins.  If you have a catheter to drain urine from your bladder.  If you are on a ventilator. A ventilator is a machine that helps you breathe by moving air in and out of your lungs.  After surgery. What are the risks? Risks of using CHG include:  A skin reaction.  Hearing loss, if CHG gets in your ears.  Eye injury, if CHG gets in your eyes and is not rinsed out.  The CHG product catching fire. Make sure that you avoid smoking and flames after applying CHG to your skin. Do not use CHG:  If you have a chlorhexidine allergy or have previously reacted to chlorhexidine.  On babies younger than 72 months of age. How to use CHG  solution  Use CHG only as told by your health care provider, and follow the instructions on the label.  Use the full amount of CHG as directed. Usually, this is one bottle. During a shower Follow these steps when using CHG solution during a shower (unless your health care provider gives you different instructions): 1. Start the shower. 2. Use your normal soap and shampoo to wash your face and hair. 3. Turn off the shower or move out of the shower stream. 4. Pour the CHG onto a clean washcloth. Do not use any type of brush or rough-edged sponge. 5. Starting at your neck, lather your body down to your toes. Make sure you follow these instructions: ? If you will be having surgery, pay special attention to the part of your body where you will be having surgery. Scrub this area for at least 1 minute. ? Do not use CHG on your head or face. If the solution gets into your ears or eyes, rinse them well with water. ? Avoid your genital area. ? Avoid any areas of skin that have broken skin, cuts, or scrapes. ? Scrub your back and under your arms. Make sure to wash skin folds. 6. Let the lather sit on your skin for 1-2 minutes or as long as told by your health care provider. 7. Thoroughly rinse your entire body in the shower. Make sure that all body creases and crevices are rinsed well. 8. Dry off with a clean towel. Do not put any substances on your body afterward--such as powder, lotion, or perfume--unless you are told to do so by your health care provider. Only use lotions that are recommended by the manufacturer. 9. Put on clean clothes or pajamas. 10. If it is the night before your surgery, sleep in clean sheets.  During a sponge bath Follow these  steps when using CHG solution during a sponge bath (unless your health care provider gives you different instructions): 1. Use your normal soap and shampoo to wash your face and hair. 2. Pour the CHG onto a clean washcloth. 3. Starting at your neck,  lather your body down to your toes. Make sure you follow these instructions: ? If you will be having surgery, pay special attention to the part of your body where you will be having surgery. Scrub this area for at least 1 minute. ? Do not use CHG on your head or face. If the solution gets into your ears or eyes, rinse them well with water. ? Avoid your genital area. ? Avoid any areas of skin that have broken skin, cuts, or scrapes. ? Scrub your back and under your arms. Make sure to wash skin folds. 4. Let the lather sit on your skin for 1-2 minutes or as long as told by your health care provider. 5. Using a different clean, wet washcloth, thoroughly rinse your entire body. Make sure that all body creases and crevices are rinsed well. 6. Dry off with a clean towel. Do not put any substances on your body afterward--such as powder, lotion, or perfume--unless you are told to do so by your health care provider. Only use lotions that are recommended by the manufacturer. 7. Put on clean clothes or pajamas. 8. If it is the night before your surgery, sleep in clean sheets. How to use CHG prepackaged cloths  Only use CHG cloths as told by your health care provider, and follow the instructions on the label.  Use the CHG cloth on clean, dry skin.  Do not use the CHG cloth on your head or face unless your health care provider tells you to.  When washing with the CHG cloth: ? Avoid your genital area. ? Avoid any areas of skin that have broken skin, cuts, or scrapes. Before surgery Follow these steps when using a CHG cloth to clean before surgery (unless your health care provider gives you different instructions): 1. Using the CHG cloth, vigorously scrub the part of your body where you will be having surgery. Scrub using a back-and-forth motion for 3 minutes. The area on your body should be completely wet with CHG when you are done scrubbing. 2. Do not rinse. Discard the cloth and let the area air-dry. Do  not put any substances on the area afterward, such as powder, lotion, or perfume. 3. Put on clean clothes or pajamas. 4. If it is the night before your surgery, sleep in clean sheets.  For general bathing Follow these steps when using CHG cloths for general bathing (unless your health care provider gives you different instructions). 1. Use a separate CHG cloth for each area of your body. Make sure you wash between any folds of skin and between your fingers and toes. Wash your body in the following order, switching to a new cloth after each step: ? The front of your neck, shoulders, and chest. ? Both of your arms, under your arms, and your hands. ? Your stomach and groin area, avoiding the genitals. ? Your right leg and foot. ? Your left leg and foot. ? The back of your neck, your back, and your buttocks. 2. Do not rinse. Discard the cloth and let the area air-dry. Do not put any substances on your body afterward--such as powder, lotion, or perfume--unless you are told to do so by your health care provider. Only use lotions that are  recommended by the manufacturer. 3. Put on clean clothes or pajamas. Contact a health care provider if:  Your skin gets irritated after scrubbing.  You have questions about using your solution or cloth. Get help right away if:  Your eyes become very red or swollen.  Your eyes itch badly.  Your skin itches badly and is red or swollen.  Your hearing changes.  You have trouble seeing.  You have swelling or tingling in your mouth or throat.  You have trouble breathing.  You swallow any chlorhexidine. Summary  Chlorhexidine gluconate (CHG) is a germ-killing (antiseptic) solution that is used to clean the skin. Cleaning your skin with CHG may help to lower your risk for infection.  You may be given CHG to use for bathing. It may be in a bottle or in a prepackaged cloth to use on your skin. Carefully follow your health care provider's instructions and the  instructions on the product label.  Do not use CHG if you have a chlorhexidine allergy.  Contact your health care provider if your skin gets irritated after scrubbing. This information is not intended to replace advice given to you by your health care provider. Make sure you discuss any questions you have with your health care provider. Document Revised: 07/10/2018 Document Reviewed: 03/21/2017 Elsevier Patient Education  [place].

## 2019-06-09 ENCOUNTER — Other Ambulatory Visit (HOSPITAL_COMMUNITY)
Admission: RE | Admit: 2019-06-09 | Discharge: 2019-06-09 | Disposition: A | Discharge status: Home / Self Care | Payer: Medicare HMO | Source: Ambulatory Visit | Attending: Orthopedic Surgery | Admitting: Orthopedic Surgery

## 2019-06-09 ENCOUNTER — Other Ambulatory Visit: Payer: Self-pay

## 2019-06-09 ENCOUNTER — Telehealth: Payer: Self-pay | Admitting: Radiology

## 2019-06-09 ENCOUNTER — Encounter (HOSPITAL_COMMUNITY): Payer: Self-pay

## 2019-06-09 ENCOUNTER — Encounter (HOSPITAL_COMMUNITY)
Admission: RE | Admit: 2019-06-09 | Discharge: 2019-06-09 | Disposition: A | Discharge status: Home / Self Care | Payer: Medicare HMO | Source: Ambulatory Visit | Attending: Orthopedic Surgery | Admitting: Orthopedic Surgery

## 2019-06-09 ENCOUNTER — Other Ambulatory Visit: Payer: Self-pay | Admitting: Orthopedic Surgery

## 2019-06-09 DIAGNOSIS — Z01812 Encounter for preprocedural laboratory examination: Secondary | ICD-10-CM | POA: Insufficient documentation

## 2019-06-09 DIAGNOSIS — M2241 Chondromalacia patellae, right knee: Secondary | ICD-10-CM

## 2019-06-09 DIAGNOSIS — G8929 Other chronic pain: Secondary | ICD-10-CM

## 2019-06-09 DIAGNOSIS — D649 Anemia, unspecified: Secondary | ICD-10-CM | POA: Insufficient documentation

## 2019-06-09 DIAGNOSIS — Z20822 Contact with and (suspected) exposure to covid-19: Secondary | ICD-10-CM | POA: Insufficient documentation

## 2019-06-09 DIAGNOSIS — M25561 Pain in right knee: Secondary | ICD-10-CM

## 2019-06-09 LAB — CBC WITH DIFFERENTIAL/PLATELET
Abs Immature Granulocytes: 0.02 10*3/uL (ref 0.00–0.07)
Basophils Absolute: 0.1 10*3/uL (ref 0.0–0.1)
Basophils Relative: 1 %
Eosinophils Absolute: 0.3 10*3/uL (ref 0.0–0.5)
Eosinophils Relative: 4 %
HCT: 28.8 % — ABNORMAL LOW (ref 36.0–46.0)
Hemoglobin: 8.8 g/dL — ABNORMAL LOW (ref 12.0–15.0)
Immature Granulocytes: 0 %
Lymphocytes Relative: 27 %
Lymphs Abs: 1.7 10*3/uL (ref 0.7–4.0)
MCH: 23.6 pg — ABNORMAL LOW (ref 26.0–34.0)
MCHC: 30.6 g/dL (ref 30.0–36.0)
MCV: 77.2 fL — ABNORMAL LOW (ref 80.0–100.0)
Monocytes Absolute: 0.6 10*3/uL (ref 0.1–1.0)
Monocytes Relative: 10 %
Neutro Abs: 3.5 10*3/uL (ref 1.7–7.7)
Neutrophils Relative %: 58 %
Platelets: 399 10*3/uL (ref 150–400)
RBC: 3.73 MIL/uL — ABNORMAL LOW (ref 3.87–5.11)
RDW: 23.4 % — ABNORMAL HIGH (ref 11.5–15.5)
WBC: 6.2 10*3/uL (ref 4.0–10.5)
nRBC: 0 % (ref 0.0–0.2)

## 2019-06-09 LAB — BASIC METABOLIC PANEL
Anion gap: 11 (ref 5–15)
BUN: 9 mg/dL (ref 6–20)
CO2: 24 mmol/L (ref 22–32)
Calcium: 9 mg/dL (ref 8.9–10.3)
Chloride: 102 mmol/L (ref 98–111)
Creatinine, Ser: 0.58 mg/dL (ref 0.44–1.00)
GFR calc Af Amer: >60 mL/min (ref >60)
GFR calc non Af Amer: >60 mL/min (ref >60)
Glucose, Bld: 71 mg/dL (ref 70–99)
Potassium: 4 mmol/L (ref 3.5–5.1)
Sodium: 137 mmol/L (ref 135–145)

## 2019-06-09 LAB — SARS CORONAVIRUS 2 (TAT 6-24 HRS): SARS Coronavirus 2: NEGATIVE

## 2019-06-09 LAB — HCG, SERUM, QUALITATIVE: Preg, Serum: NEGATIVE

## 2019-06-09 NOTE — Telephone Encounter (Signed)
Hydrocodone-Acetaminophen  5/331m  Qty 30 Tablets  Take 1 tablet by mouth every 6(six) hours as needed for moderate pain.  [facility]ES  [facility]

## 2019-06-09 NOTE — Telephone Encounter (Signed)
For authorization of the knee surgery.  [id number] [employer] approved

## 2019-06-10 ENCOUNTER — Telehealth: Payer: Self-pay

## 2019-06-10 ENCOUNTER — Encounter (HOSPITAL_COMMUNITY): Payer: Self-pay | Admitting: Anesthesiology

## 2019-06-10 ENCOUNTER — Telehealth: Payer: Self-pay | Admitting: Radiology

## 2019-06-10 ENCOUNTER — Other Ambulatory Visit: Payer: Self-pay | Admitting: Orthopedic Surgery

## 2019-06-10 DIAGNOSIS — M25561 Pain in right knee: Secondary | ICD-10-CM

## 2019-06-10 DIAGNOSIS — G8929 Other chronic pain: Secondary | ICD-10-CM

## 2019-06-10 MED ORDER — HYDROCODONE-ACETAMINOPHEN 5-325 MG PO TABS: 1.0000 | ORAL_TABLET | Freq: Four times a day (QID) | ORAL | 0 refills | Status: DC | PRN

## 2019-06-10 NOTE — Telephone Encounter (Signed)
PCP has started her on iron supplement and will draw more labs next week  She will let us know what results are, and we will RS accordingly  To you  FYI

## 2019-06-10 NOTE — Pre-Procedure Instructions (Signed)
H&H shown to Dr Hilaria Ota and he advises patient not be done at this time due to Hgb 8.8. Note to Dr Aline Brochure who will cancel surgery for now. Amy Littrell messaged me and will call patient and let her know. Ladona Horns, OR scheduler also notified.

## 2019-06-10 NOTE — Telephone Encounter (Signed)
I left message for her to call me back.

## 2019-06-10 NOTE — Progress Notes (Signed)
Surgery postponed patient anemic recommend work-up with medical provider  Medication sent in for pain until work-up completed

## 2019-06-10 NOTE — Telephone Encounter (Signed)
Patient has low hemoglobin, surgery for tomorrow has cancelled. I called her to discuss. She was told to see her primary care for the abnormal labs, she states she will call today.

## 2019-06-10 NOTE — Telephone Encounter (Signed)
-----   Message from Carole Civil, MD sent at 06/10/2019  9:14 AM EST -----   ----- Message ----- From: Encarnacion Chu, RN Sent: 06/10/2019   8:27 AM EST To: Carole Civil, MD

## 2019-06-10 NOTE — Telephone Encounter (Signed)
Patient returned your call and asked for you to call her back, would not discuss with me.

## 2019-06-10 NOTE — Progress Notes (Signed)
Cancel surgery for anemia

## 2019-06-11 ENCOUNTER — Ambulatory Visit (HOSPITAL_COMMUNITY): Admission: RE | Admit: 2019-06-11 | Payer: Medicare HMO | Source: Home / Self Care | Admitting: Orthopedic Surgery

## 2019-06-11 ENCOUNTER — Encounter (HOSPITAL_COMMUNITY): Admission: RE | Payer: Self-pay | Source: Home / Self Care

## 2019-06-11 SURGERY — ARTHROSCOPY, KNEE
Anesthesia: General | Site: Knee | Laterality: Right

## 2019-06-11 SURGERY — ARTHROSCOPY, KNEE
Anesthesia: Choice | Site: Knee | Laterality: Right

## 2019-06-17 ENCOUNTER — Encounter: Payer: Medicare HMO | Admitting: Orthopedic Surgery

## 2019-06-17 NOTE — Telephone Encounter (Signed)
Patient states she did have more labs done on 06/15/19 at [facility] through her primary care at [facility], and has had a follow up visit today, 06/17/19. We have notified [facility] of our temporary fax# [phone], as patient said they will be faxing results and notes.

## 2019-06-17 NOTE — Telephone Encounter (Signed)
Ok thanks 

## 2019-06-18 ENCOUNTER — Telehealth: Payer: Self-pay | Admitting: Orthopedic Surgery

## 2019-06-18 NOTE — Telephone Encounter (Signed)
Patient's lab report received via fax this morning from provider Denyce Robert, [facility]; placed in Dr Ruthe Mannan box

## 2019-06-22 ENCOUNTER — Other Ambulatory Visit: Payer: Self-pay | Admitting: Orthopedic Surgery

## 2019-06-22 ENCOUNTER — Telehealth: Payer: Self-pay | Admitting: Orthopedic Surgery

## 2019-06-22 DIAGNOSIS — G8929 Other chronic pain: Secondary | ICD-10-CM

## 2019-06-22 DIAGNOSIS — D649 Anemia, unspecified: Secondary | ICD-10-CM

## 2019-06-22 MED ORDER — HYDROCODONE-ACETAMINOPHEN 5-325 MG PO TABS: 1.0000 | ORAL_TABLET | Freq: Two times a day (BID) | ORAL | 0 refills | Status: DC | PRN

## 2019-06-22 NOTE — Telephone Encounter (Signed)
Hg is 8.8 needs to see hematology

## 2019-06-22 NOTE — Telephone Encounter (Signed)
Patient requests refill on Hydrocodone/Actaminophen 5-325  Mgs.  Qty  30  Sig: Take 1 tablet by mouth every 6 (six) hours as needed for moderate pain.  Patient states she uses [facility]

## 2019-06-22 NOTE — Telephone Encounter (Signed)
Karen Mercer asks that you call her regarding the results of her last blood work.  She said she hasnt heard back from anyone.  Thanks

## 2019-06-22 NOTE — Telephone Encounter (Signed)
Advised patient and put in order.

## 2019-06-22 NOTE — Telephone Encounter (Signed)
Hemoglobin has gone up 9.? , PCP has her taking iron. She faxed you the report. It was placed in your box, did you see it?

## 2019-06-22 NOTE — Telephone Encounter (Signed)
Put in the hematolgy consult

## 2019-06-29 ENCOUNTER — Other Ambulatory Visit: Payer: Self-pay | Admitting: Orthopedic Surgery

## 2019-06-29 DIAGNOSIS — G8929 Other chronic pain: Secondary | ICD-10-CM

## 2019-06-29 NOTE — Telephone Encounter (Signed)
Patient requests refill on Hydrocodone/Acetaminophen 5-325  Mgs.  Qty  14      Sig: Take 1 tablet by mouth every 12 (twelve) hours as needed for up to 7 days for severe pain.    Patient states she uses [facility]

## 2019-06-30 ENCOUNTER — Other Ambulatory Visit: Payer: Self-pay | Admitting: Orthopedic Surgery

## 2019-06-30 DIAGNOSIS — G8929 Other chronic pain: Secondary | ICD-10-CM

## 2019-07-02 ENCOUNTER — Telehealth: Payer: Self-pay | Admitting: Orthopedic Surgery

## 2019-07-02 ENCOUNTER — Ambulatory Visit (HOSPITAL_COMMUNITY): Payer: Medicaid Other | Admitting: Nurse Practitioner

## 2019-07-02 NOTE — Telephone Encounter (Signed)
Reviewed. Done. (sent to scanning)

## 2019-07-02 NOTE — Telephone Encounter (Signed)
[facility] faxed a note (their ph [phone] to notify that patient may be new to their mail-in pharmacy, and included patient's Gabapentin 363m medication. Called patient to inquire as to whether she may need a prescription refill, and she said she does not. Said did not know [facility]tiwas sending out a notice; said [facility], as noted in chart. Thanked u[place]for letting her know about the fax.

## 2019-07-08 ENCOUNTER — Encounter (HOSPITAL_COMMUNITY): Payer: Self-pay

## 2019-07-08 ENCOUNTER — Other Ambulatory Visit: Payer: Self-pay

## 2019-07-09 ENCOUNTER — Encounter (HOSPITAL_COMMUNITY): Payer: Self-pay | Admitting: Hematology

## 2019-07-09 ENCOUNTER — Inpatient Hospital Stay (HOSPITAL_COMMUNITY): Payer: Medicare HMO | Attending: Hematology | Admitting: Hematology

## 2019-07-09 ENCOUNTER — Inpatient Hospital Stay (HOSPITAL_COMMUNITY): Payer: Medicare HMO

## 2019-07-09 DIAGNOSIS — F17210 Nicotine dependence, cigarettes, uncomplicated: Secondary | ICD-10-CM | POA: Diagnosis not present

## 2019-07-09 DIAGNOSIS — M069 Rheumatoid arthritis, unspecified: Secondary | ICD-10-CM | POA: Insufficient documentation

## 2019-07-09 DIAGNOSIS — N920 Excessive and frequent menstruation with regular cycle: Secondary | ICD-10-CM | POA: Insufficient documentation

## 2019-07-09 DIAGNOSIS — D509 Iron deficiency anemia, unspecified: Secondary | ICD-10-CM | POA: Insufficient documentation

## 2019-07-09 LAB — COMPREHENSIVE METABOLIC PANEL
ALT: 43 U/L (ref 0–44)
AST: 41 U/L (ref 15–41)
Albumin: 4 g/dL (ref 3.5–5.0)
Alkaline Phosphatase: 47 U/L (ref 38–126)
Anion gap: 8 (ref 5–15)
BUN: 9 mg/dL (ref 6–20)
CO2: 25 mmol/L (ref 22–32)
Calcium: 9.3 mg/dL (ref 8.9–10.3)
Chloride: 107 mmol/L (ref 98–111)
Creatinine, Ser: 0.57 mg/dL (ref 0.44–1.00)
GFR calc Af Amer: >60 mL/min (ref >60)
GFR calc non Af Amer: >60 mL/min (ref >60)
Glucose, Bld: 94 mg/dL (ref 70–99)
Potassium: 3.9 mmol/L (ref 3.5–5.1)
Sodium: 140 mmol/L (ref 135–145)
Total Bilirubin: 0.5 mg/dL (ref 0.3–1.2)
Total Protein: 7.3 g/dL (ref 6.5–8.1)

## 2019-07-09 LAB — CBC WITH DIFFERENTIAL/PLATELET
Abs Immature Granulocytes: 0.01 10*3/uL (ref 0.00–0.07)
Basophils Absolute: 0 10*3/uL (ref 0.0–0.1)
Basophils Relative: 1 %
Eosinophils Absolute: 0.3 10*3/uL (ref 0.0–0.5)
Eosinophils Relative: 4 %
HCT: 33.2 % — ABNORMAL LOW (ref 36.0–46.0)
Hemoglobin: 10 g/dL — ABNORMAL LOW (ref 12.0–15.0)
Immature Granulocytes: 0 %
Lymphocytes Relative: 28 %
Lymphs Abs: 1.7 10*3/uL (ref 0.7–4.0)
MCH: 24.3 pg — ABNORMAL LOW (ref 26.0–34.0)
MCHC: 30.1 g/dL (ref 30.0–36.0)
MCV: 80.8 fL (ref 80.0–100.0)
Monocytes Absolute: 0.7 10*3/uL (ref 0.1–1.0)
Monocytes Relative: 11 %
Neutro Abs: 3.5 10*3/uL (ref 1.7–7.7)
Neutrophils Relative %: 56 %
Platelets: 327 10*3/uL (ref 150–400)
RBC: 4.11 MIL/uL (ref 3.87–5.11)
RDW: 25.9 % — ABNORMAL HIGH (ref 11.5–15.5)
WBC: 6.3 10*3/uL (ref 4.0–10.5)
nRBC: 0 % (ref 0.0–0.2)

## 2019-07-09 LAB — IRON AND TIBC
Iron: 15 ug/dL — ABNORMAL LOW (ref 28–170)
Saturation Ratios: 4 % — ABNORMAL LOW (ref 10.4–31.8)
TIBC: 359 ug/dL (ref 250–450)
UIBC: 344 ug/dL

## 2019-07-09 LAB — RETICULOCYTES
Immature Retic Fract: 32.3 % — ABNORMAL HIGH (ref 2.3–15.9)
RBC.: 4.08 MIL/uL (ref 3.87–5.11)
Retic Count, Absolute: 48.1 10*3/uL (ref 19.0–186.0)
Retic Ct Pct: 1.2 % (ref 0.4–3.1)

## 2019-07-09 LAB — VITAMIN B12: Vitamin B-12: 361 pg/mL (ref 180–914)

## 2019-07-09 LAB — VITAMIN D 25 HYDROXY (VIT D DEFICIENCY, FRACTURES): Vit D, 25-Hydroxy: 7.94 ng/mL — ABNORMAL LOW (ref 30–100)

## 2019-07-09 LAB — LACTATE DEHYDROGENASE: LDH: 176 U/L (ref 98–192)

## 2019-07-09 LAB — FERRITIN: Ferritin: 15 ng/mL (ref 11–307)

## 2019-07-09 LAB — FOLATE: Folate: 13.3 ng/mL (ref >5.9)

## 2019-07-09 NOTE — Patient Instructions (Signed)
[place] at [facility] Discharge Instructions  Follow up in 6 weeks with labs    Thank you for choosing [place] at [facility] to provide your oncology and hematology care.  To afford each patient quality time with our provider, please arrive at least 15 minutes before your scheduled appointment time.   If you have a lab appointment with the [facility] please come in thru the Main Entrance and check in at the main information desk.  You need to re-schedule your appointment should you arrive 10 or more minutes late.  We strive to give you quality time with our providers, and arriving late affects you and other patients whose appointments are after yours.  Also, if you no show three or more times for appointments you may be dismissed from the clinic at the providers discretion.     Again, thank you for choosing [facility].  Our hope is that these requests will decrease the amount of time that you wait before being seen by our physicians.       _____________________________________________________________  Should you have questions after your visit to [facility], please contact our office at [phone] between the hours of 8:00 a.m. and 4:30 p.m.  Voicemails left after 4:00 p.m. will not be returned until the following business day.  For prescription refill requests, have your pharmacy contact our office and allow 72 hours.    Due to Covid, you will need to wear a mask upon entering the hospital. If you do not have a mask, a mask will be given to you at the Main Entrance upon arrival. For doctor visits, patients may have 1 support person with them. For treatment visits, patients can not have anyone with them due to social distancing guidelines and our immunocompromised population.

## 2019-07-09 NOTE — Assessment & Plan Note (Addendum)
1.  Microcytic anemia: -DC on 06/09/2019 shows hemoglobin 8.8 with MCV of 77. -Patient reports that she has excessive menstrual bleeding lasting up to 7 days with lots of clots. -She denies any bleeding per rectum or melena. -Patient has been started on iron tablet daily in the past few weeks.  She is tolerating it very well. -She has ice pica and is eating 1 bag of ice per day. -CT abdomen several years ago reviewed by me showed normal spleen and liver. -I have recommended her to increase iron tablets to 2-3 times a day if she tolerates it.  If she gets severe constipation, she can go back to taking 1 tablet daily which she is tolerating it very well. -We will do baseline CBC and ferritin today.  We will also rule out other nutritional deficiencies.  We will also check for SPEP. -If there is further drop of hemoglobin, will consider parenteral iron therapy.  Otherwise she will continue oral iron therapy and will see [place] back in 6 weeks for follow-up.

## 2019-07-09 NOTE — Progress Notes (Signed)
CONSULT NOTE  Patient Care Team: Jani Gravel, MD as PCP - General (Internal Medicine)  CHIEF COMPLAINTS/PURPOSE OF CONSULTATION: Iron deficiency anemia  HISTORY OF PRESENTING ILLNESS:  Karen Mercer 49 y.o. female was sent here by her primary care doctor for iron deficiency anemia.  Patient reports she was being seen by her doctor for a preop surgery consultation when they checked her labs and her hemoglobin was 8.8.  Patient denies any bright red bleeding per rectum or melena.  Patient does report she has very heavy periods with lots of blood clots.  She was seen by her GYN a year ago where they did a Pap.  She does have fibroids but they are not large enough to remove.  Patient was placed on oral iron supplements.  She is tolerating them well with no GI side effects.  Patient has ice pica where she eats 1 bag of ice per day.  She reports she has lost almost 20 pounds in the past 3 to 4 months.  She reports her appetite has changed.  She is currently trying to gain weight back.  She denies a history of needing a blood transfusion.  She denies a history of blood clots.  Patient reports she does smoke half a pack per day since the age of 38.  Patient denies any daily alcohol or illegal drug use.  Patient has not noticed any recent bleeding such as epistasis, hematuria or hematochezia.  She denies any recent chest pain on exertion, shortness of breath on minimal exertion, presyncopal episodes, or palpitations.  Patient denies any over-the-counter NSAIDs.  She is not on any antiplatelet agents.  Patient has no prior history or diagnosis of cancer.  Her age-appropriate screenings are up-to-date.  She has not had her first colonoscopy due to age.  Patient denies any family history of cancer.  Her mother was a patient of ours and needed weekly injections and blood transfusions.    MEDICAL HISTORY:  Past Medical History:  Diagnosis Date  . Chronic back pain    both legs;DDD and stenosis   .  Complication of anesthesia    had extreme headache after last back surgery - was in pacu for extended period  . Muscle spasm    takes Valium daily as needed  . Neuromuscular disorder ([place])   . PONV (postoperative nausea and vomiting)   . Rheumatoid arthritis ([place])     SURGICAL HISTORY: Past Surgical History:  Procedure Laterality Date  . BACK SURGERY     at surgical center  . KNEE ARTHROSCOPY  08/17/2011   Procedure: ARTHROSCOPY KNEE;  Surgeon: Carole Civil, MD;  Location: [facility];  Service: Orthopedics;  Laterality: Right;  with tibia tubercle transfer and proximal realignment  . KNEE ARTHROSCOPY WITH FULKERSON SLIDE Left 08/29/2012   Procedure: Arthroscopic lateral release with chondroplasty left knee;  Surgeon: Carole Civil, MD;  Location: [facility];  Service: Orthopedics;  Laterality: Left;  . KNEE ARTHROSCOPY WITH MEDIAL PATELLAR FEMORAL LIGAMENT RECONSTRUCTION Left 08/29/2012   Procedure: Open proximal realignment fulkerson;  Surgeon: Carole Civil, MD;  Location: [facility];  Service: Orthopedics;  Laterality: Left;  . KNEE SURGERY  2012   right knee-arthroscopy  . LUMBAR LAMINECTOMY/DECOMPRESSION MICRODISCECTOMY Right 06/11/2014   Procedure: Right L4-5 Lumbar diskectomy;  Surgeon: Floyce Stakes, MD;  Location: [facility];  Service: Neurosurgery;  Laterality: Right;  Right L4-5 Lumbar diskectomy  . right arm  2008   rods from fractured arm  .  TUBAL LIGATION  1995    SOCIAL HISTORY: Social History   Socioeconomic History  . Marital status: Single    Spouse name: Not on file  . Number of children: 4  . Years of education: Not on file  . Highest education level: Not on file  Occupational History  . Occupation: Disabled  Tobacco Use  . Smoking status: Current Every Day Smoker    Packs/day: 1.00    Years: 20.00    Pack years: 20.00    Types: Cigarettes  . Smokeless tobacco: Never Used  Substance and Sexual Activity  . Alcohol use: Yes    Comment:  OCCASIONALLY   . Drug use: No  . Sexual activity: Yes    Birth control/protection: Surgical    Comment: tubal  Other Topics Concern  . Not on file  Social History Narrative  . Not on file   Social Determinants of Health   Financial Resource Strain:   . Difficulty of Paying Living Expenses: Not on file  Food Insecurity:   . Worried About Charity fundraiser in the Last Year: Not on file  . Ran Out of Food in the Last Year: Not on file  Transportation Needs:   . Lack of Transportation (Medical): Not on file  . Lack of Transportation (Non-Medical): Not on file  Physical Activity:   . Days of Exercise per Week: Not on file  . Minutes of Exercise per Session: Not on file  Stress:   . Feeling of Stress : Not on file  Social Connections:   . Frequency of Communication with Friends and Family: Not on file  . Frequency of Social Gatherings with Friends and Family: Not on file  . Attends Religious Services: Not on file  . Active Member of Clubs or Organizations: Not on file  . Attends Archivist Meetings: Not on file  . Marital Status: Not on file  Intimate Partner Violence:   . Fear of Current or Ex-Partner: Not on file  . Emotionally Abused: Not on file  . Physically Abused: Not on file  . Sexually Abused: Not on file    FAMILY HISTORY: Family History  Problem Relation Age of Onset  . Arthritis Other        family history   . Hypertension Mother   . Seizures Son   . Healthy Son   . Healthy Son   . Healthy Daughter   . Healthy Sister   . Hypertension Brother   . Hypertension Maternal Grandmother   . Hypertension Paternal Grandmother   . Healthy Sister   . Healthy Sister   . Healthy Sister     ALLERGIES:  is allergic to tramadol.  MEDICATIONS:  Current Outpatient Medications  Medication Sig Dispense Refill  . HYDROcodone-acetaminophen (NORCO/VICODIN) 5-325 MG tablet TAKE (1) TABLET BY MOUTH EVERY 12 HOURS AS NEEDED. (Patient not taking: Reported on  07/08/2019) 14 tablet 0   No current facility-administered medications for this visit.    REVIEW OF SYSTEMS:   Constitutional: Denies fevers, chills or abnormal night sweats Respiratory: Denies cough, dyspnea or wheezes Cardiovascular: Denies palpitation, chest discomfort or lower extremity swelling Gastrointestinal:  Denies nausea, heartburn or change in bowel habits Skin: Denies abnormal skin rashes Lymphatics: Denies new lymphadenopathy or easy bruising Neurological:Denies numbness, tingling or new weaknesses Behavioral/Psych: Mood is stable, no new changes  All other systems were reviewed with the patient and are negative.  PHYSICAL EXAMINATION: ECOG PERFORMANCE STATUS: 1 - Symptomatic but completely ambulatory  Vitals:   07/09/19 0900  BP: (!) 145/93  Pulse: 73  Resp: 16  Temp: (!) 97.3 F (36.3 C)  SpO2: 100%   Filed Weights   07/09/19 0900  Weight: 136 lb 4 oz (61.8 kg)    GENERAL:alert, no distress and comfortable SKIN: skin color, texture, turgor are normal, no rashes or significant lesions NECK: supple, thyroid normal size, non-tender, without nodularity LYMPH:  no palpable lymphadenopathy in the cervical, axillary or inguinal LUNGS: clear to auscultation and percussion with normal breathing effort HEART: regular rate & rhythm and no murmurs and no lower extremity edema ABDOMEN:abdomen soft, non-tender and normal bowel sounds Musculoskeletal:no cyanosis of digits and no clubbing  PSYCH: alert & oriented x 3 with fluent speech NEURO: no focal motor/sensory deficits  LABORATORY DATA:  I have reviewed the data as listed Recent Results (from the past 2160 hour(s))  SARS CORONAVIRUS 2 (TAT 6-24 HRS) Nasopharyngeal Nasopharyngeal Swab     Status: None   Collection Time: 06/09/19  7:07 AM   Specimen: Nasopharyngeal Swab  Result Value Ref Range   SARS Coronavirus 2 NEGATIVE NEGATIVE    Comment: (NOTE) SARS-CoV-2 target nucleic acids are NOT DETECTED. The  SARS-CoV-2 RNA is generally detectable in upper and lower respiratory specimens during the acute phase of infection. Negative results do not preclude SARS-CoV-2 infection, do not rule out co-infections with other pathogens, and should not be used as the sole basis for treatment or other patient management decisions. Negative results must be combined with clinical observations, patient history, and epidemiological information. The expected result is Negative. Fact Sheet for Patients: SugarRoll.be Fact Sheet for Healthcare Providers: https://www.woods-mathews.com/ This test is not yet approved or cleared by the [place] FDA and  has been authorized for detection and/or diagnosis of SARS-CoV-2 by FDA under an Emergency Use Authorization (EUA). This EUA will remain  in effect (meaning this test can be used) for the duration of the COVID-19 declaration under Section 56 4(b)(1) of the Act, 21 U.S.C. section 360bbb-3(b)(1), unless the authorization is terminated or revoked sooner. Performed at [facility], [place] [address], [place], [place] [address]   CBC with Differential/Platelet     Status: Abnormal   Collection Time: 06/09/19  2:41 PM  Result Value Ref Range   WBC 6.2 4.0 - 10.5 K/uL   RBC 3.73 (L) 3.87 - 5.11 MIL/uL   Hemoglobin 8.8 (L) 12.0 - 15.0 g/dL    Comment: Reticulocyte Hemoglobin testing may be clinically indicated, consider ordering this additional test [id number]    HCT 28.8 (L) 36.0 - 46.0 %   MCV 77.2 (L) 80.0 - 100.0 fL   MCH 23.6 (L) 26.0 - 34.0 pg   MCHC 30.6 30.0 - 36.0 g/dL   RDW 23.4 (H) 11.5 - 15.5 %   Platelets 399 150 - 400 K/uL   nRBC 0.0 0.0 - 0.2 %   Neutrophils Relative % 58 %   Neutro Abs 3.5 1.7 - 7.7 K/uL   Lymphocytes Relative 27 %   Lymphs Abs 1.7 0.7 - 4.0 K/uL   Monocytes Relative 10 %   Monocytes Absolute 0.6 0.1 - 1.0 K/uL   Eosinophils Relative 4 %   Eosinophils Absolute 0.3 0.0 - 0.5  K/uL   Basophils Relative 1 %   Basophils Absolute 0.1 0.0 - 0.1 K/uL   Immature Granulocytes 0 %   Abs Immature Granulocytes 0.02 0.00 - 0.07 K/uL    Comment: Performed at [facility], [address], [place],  [place] [address]  Basic metabolic panel     Status: None   Collection Time: 06/09/19  2:41 PM  Result Value Ref Range   Sodium 137 135 - 145 mmol/L   Potassium 4.0 3.5 - 5.1 mmol/L   Chloride 102 98 - 111 mmol/L   CO2 24 22 - 32 mmol/L   Glucose, Bld 71 70 - 99 mg/dL   BUN 9 6 - 20 mg/dL   Creatinine, Ser 0.58 0.44 - 1.00 mg/dL   Calcium 9.0 8.9 - 10.3 mg/dL   GFR calc non Af Amer >60 >60 mL/min   GFR calc Af Amer >60 >60 mL/min   Anion gap 11 5 - 15    Comment: Performed at [facility], [address] [place], [place] [address]  hCG, serum, qualitative (Not at [facility])     Status: None   Collection Time: 06/09/19  2:41 PM  Result Value Ref Range   Preg, Serum NEGATIVE NEGATIVE    Comment:        THE SENSITIVITY OF THIS METHODOLOGY IS >10 mIU/mL. Performed at [facility], [address] [place], [place] [address]   Reticulocytes     Status: Abnormal   Collection Time: 07/09/19 10:40 AM  Result Value Ref Range   Retic Ct Pct 1.2 0.4 - 3.1 %   RBC. 4.08 3.87 - 5.11 MIL/uL   Retic Count, Absolute 48.1 19.0 - 186.0 K/uL   Immature Retic Fract 32.3 (H) 2.3 - 15.9 %    Comment: Performed at [facility], [address], [place], [place] [address]  Lactate dehydrogenase     Status: None   Collection Time: 07/09/19 10:40 AM  Result Value Ref Range   LDH 176 98 - 192 U/L    Comment: Performed at [facility], [address], [place], [place] [address]  Iron and TIBC     Status: Abnormal   Collection Time: 07/09/19 10:40 AM  Result Value Ref Range   Iron 15 (L) 28 - 170 ug/dL   TIBC 359 250 - 450 ug/dL   Saturation Ratios 4 (L) 10.4 - 31.8 %   UIBC 344 ug/dL    Comment: Performed at [facility], [address], [place], [place] [address]  Ferritin     Status: None    Collection Time: 07/09/19 10:40 AM  Result Value Ref Range   Ferritin 15 11 - 307 ng/mL    Comment: Performed at [facility], [address], [place], [place] [address]  Comprehensive metabolic panel     Status: None   Collection Time: 07/09/19 10:40 AM  Result Value Ref Range   Sodium 140 135 - 145 mmol/L   Potassium 3.9 3.5 - 5.1 mmol/L   Chloride 107 98 - 111 mmol/L   CO2 25 22 - 32 mmol/L   Glucose, Bld 94 70 - 99 mg/dL    Comment: Glucose reference range applies only to samples taken after fasting for at least 8 hours.   BUN 9 6 - 20 mg/dL   Creatinine, Ser 0.57 0.44 - 1.00 mg/dL   Calcium 9.3 8.9 - 10.3 mg/dL   Total Protein 7.3 6.5 - 8.1 g/dL   Albumin 4.0 3.5 - 5.0 g/dL   AST 41 15 - 41 U/L   ALT 43 0 - 44 U/L   Alkaline Phosphatase 47 38 - 126 U/L   Total Bilirubin 0.5 0.3 - 1.2 mg/dL   GFR calc non Af Amer >60 >60 mL/min   GFR calc Af  Amer >60 >60 mL/min   Anion gap 8 5 - 15    Comment: Performed at [facility], [address], [place], [place] [address]  CBC with Differential/Platelet     Status: Abnormal   Collection Time: 07/09/19 10:40 AM  Result Value Ref Range   WBC 6.3 4.0 - 10.5 K/uL   RBC 4.11 3.87 - 5.11 MIL/uL   Hemoglobin 10.0 (L) 12.0 - 15.0 g/dL   HCT 33.2 (L) 36.0 - 46.0 %   MCV 80.8 80.0 - 100.0 fL   MCH 24.3 (L) 26.0 - 34.0 pg   MCHC 30.1 30.0 - 36.0 g/dL   RDW 25.9 (H) 11.5 - 15.5 %   Platelets 327 150 - 400 K/uL   nRBC 0.0 0.0 - 0.2 %   Neutrophils Relative % 56 %   Neutro Abs 3.5 1.7 - 7.7 K/uL   Lymphocytes Relative 28 %   Lymphs Abs 1.7 0.7 - 4.0 K/uL   Monocytes Relative 11 %   Monocytes Absolute 0.7 0.1 - 1.0 K/uL   Eosinophils Relative 4 %   Eosinophils Absolute 0.3 0.0 - 0.5 K/uL   Basophils Relative 1 %   Basophils Absolute 0.0 0.0 - 0.1 K/uL   RBC Morphology ANISOCYTOSIS PRESENT    Immature Granulocytes 0 %   Abs Immature Granulocytes 0.01 0.00 - 0.07 K/uL   Tear Drop Cells PRESENT    Polychromasia PRESENT    Target Cells  PRESENT     Comment: Performed at [facility], [address], [place], [place] [address]  Vitamin B12     Status: None   Collection Time: 07/09/19 10:40 AM  Result Value Ref Range   Vitamin B-12 361 180 - 914 pg/mL    Comment: (NOTE) This assay is not validated for testing neonatal or myeloproliferative syndrome specimens for Vitamin B12 levels. Performed at [facility], [address] [place], [place] [address]   VITAMIN D 25 Hydroxy (Vit-D Deficiency, Fractures)     Status: Abnormal   Collection Time: 07/09/19 10:40 AM  Result Value Ref Range   Vit D, 25-Hydroxy 7.94 (L) 30 - 100 ng/mL    Comment: (NOTE) Vitamin D deficiency has been defined by the Institute of Medicine  and an Endocrine Society practice guideline as a level of serum 25-OH  vitamin D less than 20 ng/mL (1,2). The Endocrine Society went on to  further define vitamin D insufficiency as a level between 21 and 29  ng/mL (2). 1. IOM (Institute of Medicine). 2010. Dietary reference intakes for  calcium and D. [place]: The [employer]. 2. Holick MF, Binkley [place], Bischoff-Ferrari HA, et al. Evaluation,  treatment, and prevention of vitamin D deficiency: an Endocrine  Society clinical practice guideline, JCEM. 2011 Jul; 96(7): 1911-30. Performed at [facility], [place] [address], [place], [place] [address]   Folate     Status: None   Collection Time: 07/09/19 10:41 AM  Result Value Ref Range   Folate 13.3 >5.9 ng/mL    Comment: Performed at [facility], [address] [place], [place] [address]    RADIOGRAPHIC STUDIES: I have personally reviewed the radiological images as listed and agreed with the findings in the report. I have independently interviewed and examined this patient.  I agree with the HPI written by my nurse practitioner Wenda Low, FNP.  I have independently formulated my assessment and plan.  ASSESSMENT & PLAN:  Microcytic anemia 1.  Microcytic anemia: -DC on 06/09/2019  shows hemoglobin  8.8 with MCV of 77. -Patient reports that she has excessive menstrual bleeding lasting up to 7 days with lots of clots. -She denies any bleeding per rectum or melena. -Patient has been started on iron tablet daily in the past few weeks.  She is tolerating it very well. -She has ice pica and is eating 1 bag of ice per day. -CT abdomen several years ago reviewed by me showed normal spleen and liver. -I have recommended her to increase iron tablets to 2-3 times a day if she tolerates it.  If she gets severe constipation, she can go back to taking 1 tablet daily which she is tolerating it very well. -We will do baseline CBC and ferritin today.  We will also rule out other nutritional deficiencies.  We will also check for SPEP. -If there is further drop of hemoglobin, will consider parenteral iron therapy.  Otherwise she will continue oral iron therapy and will see [place] back in 6 weeks for follow-up.     All questions were answered. The patient knows to call the clinic with any problems, questions or concerns.      Derek Jack, MD 07/09/19 6:41 PM

## 2019-07-10 LAB — HAPTOGLOBIN: Haptoglobin: 136 mg/dL (ref 42–296)

## 2019-07-12 LAB — COPPER, SERUM: Copper: 169 ug/dL — ABNORMAL HIGH (ref 80–158)

## 2019-07-14 ENCOUNTER — Telehealth (HOSPITAL_COMMUNITY): Payer: Self-pay | Admitting: *Deleted

## 2019-07-14 ENCOUNTER — Other Ambulatory Visit (HOSPITAL_COMMUNITY): Payer: Self-pay | Admitting: Nurse Practitioner

## 2019-07-14 DIAGNOSIS — E559 Vitamin D deficiency, unspecified: Secondary | ICD-10-CM

## 2019-07-14 LAB — METHYLMALONIC ACID, SERUM: Methylmalonic Acid, Quantitative: 63 nmol/L (ref 0–378)

## 2019-07-14 MED ORDER — ERGOCALCIFEROL 1.25 MG (50000 UT) PO CAPS: 50000.0000 [IU] | ORAL_CAPSULE | ORAL | 4 refills | Status: DC

## 2019-07-15 ENCOUNTER — Other Ambulatory Visit: Payer: Self-pay | Admitting: Orthopedic Surgery

## 2019-07-15 DIAGNOSIS — M25561 Pain in right knee: Secondary | ICD-10-CM

## 2019-07-15 DIAGNOSIS — G8929 Other chronic pain: Secondary | ICD-10-CM

## 2019-07-15 NOTE — Telephone Encounter (Signed)
Yes they will handle all blood related qs when they have her hg stable I d say 10 then surgery ok

## 2019-07-15 NOTE — Telephone Encounter (Signed)
Patient called to  (1) ask if she may have a refill of medication: HYDROcodone-acetaminophen (NORCO/VICODIN) 5-325 MG tablet 14 tablet  -[facility]   And (2) to inquire about having her blood count checked - said we have done this for her.

## 2019-07-15 NOTE — Telephone Encounter (Signed)
Hematology states   -I have recommended her to increase iron tablets to 2-3 times a day if she tolerates it.  If she gets severe constipation, she can go back to taking 1 tablet daily which she is tolerating it very well. -We will do baseline CBC and ferritin today.  We will also rule out other nutritional deficiencies.  We will also check for SPEP. -If there is further drop of hemoglobin, will consider parenteral iron therapy.  Otherwise she will continue oral iron therapy and will see [place] back in 6 weeks for follow-up.  She is asking about lab, but I think Hematology will do this.

## 2019-07-16 ENCOUNTER — Other Ambulatory Visit: Payer: Self-pay | Admitting: Orthopedic Surgery

## 2019-07-16 NOTE — Telephone Encounter (Signed)
She also is requesting Hydrocodone

## 2019-07-16 NOTE — Telephone Encounter (Signed)
She is at 10 have RS surgery To Dr Lemmie Evens. Already to advise about Hydrocodone

## 2019-07-16 NOTE — Telephone Encounter (Signed)
Has to stop opioids until surgery

## 2019-07-21 ENCOUNTER — Other Ambulatory Visit: Payer: Self-pay

## 2019-07-21 ENCOUNTER — Other Ambulatory Visit (HOSPITAL_COMMUNITY)
Admission: RE | Admit: 2019-07-21 | Discharge: 2019-07-21 | Disposition: A | Discharge status: Home / Self Care | Payer: Medicare HMO | Source: Ambulatory Visit | Attending: Orthopedic Surgery | Admitting: Orthopedic Surgery

## 2019-07-21 DIAGNOSIS — Z20822 Contact with and (suspected) exposure to covid-19: Secondary | ICD-10-CM | POA: Insufficient documentation

## 2019-07-21 DIAGNOSIS — Z01812 Encounter for preprocedural laboratory examination: Secondary | ICD-10-CM | POA: Diagnosis present

## 2019-07-21 LAB — SARS CORONAVIRUS 2 (TAT 6-24 HRS): SARS Coronavirus 2: NEGATIVE

## 2019-07-21 NOTE — Patient Instructions (Signed)
Karen Mercer  07/21/2019     @PREFPERIOPPHARMACY @   Your procedure is scheduled on  07/23/2019   Report to [facility] at  42  A.M.  Call this number if you have problems the morning of surgery:  [phone]   Remember:  Do not eat or drink after midnight.                        Take these medicines the morning of surgery with A SIP OF WATER  hydrocodone    Do not wear jewelry, make-up or nail polish.  Do not wear lotions, powders, or perfumes. Please wear deodorant and brush your teeth.  Do not shave 48 hours prior to surgery.  Men may shave face and neck.  Do not bring valuables to the hospital.  [facility] is not responsible for any belongings or valuables.  Contacts, dentures or bridgework may not be worn into surgery.  Leave your suitcase in the car.  After surgery it may be brought to your room.  For patients admitted to the hospital, discharge time will be determined by your treatment team.  Patients discharged the day of surgery will not be allowed to drive home.   Name and phone number of your driver:   family Special instructions:   DO NOT smoke the morning of your procedure.  Please read over the following fact sheets that you were given. Anesthesia Post-op Instructions and Care and Recovery After Surgery       Arthroscopic Knee Ligament Repair, Care After This sheet gives you information about how to care for yourself after your procedure. Your health care provider may also give you more specific instructions. If you have problems or questions, contact your health care provider. What can I expect after the procedure? After the procedure, it is common to have:  Pain in your knee.  Bruising and swelling on your knee, calf, and ankle for 3-4 days.  Fatigue. Follow these instructions at home: If you have a brace or immobilizer:  Wear the brace or immobilizer as told by your health care provider. Remove it only as told by your health care  provider.  Loosen the splint or immobilizer if your toes tingle, become numb, or turn cold and blue.  Keep the brace or immobilizer clean. Bathing  Do not take baths, swim, or use a hot tub until your health care provider approves. Ask your health care provider if you can take showers.  Keep your bandage (dressing) dry until your health care provider says that it can be removed. Cover it and your brace or immobilizer with a watertight covering when you take a shower. Incision care   Follow instructions from your health care provider about how to take care of your incision. Make sure you: ? Wash your hands with soap and water before you change your bandage (dressing). If soap and water are not available, use hand sanitizer. ? Change your dressing as told by your health care provider. ? Leave stitches (sutures), skin glue, or adhesive strips in place. These skin closures may need to stay in place for 2 weeks or longer. If adhesive strip edges start to loosen and curl up, you may trim the loose edges. Do not remove adhesive strips completely unless your health care provider tells you to do that.  Check your incision area every day for signs of infection. Check for: ? More redness, swelling, or  pain. ? More fluid or blood. ? Warmth. ? Pus or a bad smell. Managing pain, stiffness, and swelling   If directed, put ice on the affected area. ? If you have a removable brace or immobilizer, remove it as told by your health care provider. ? Put ice in a plastic bag. ? Place a towel between your skin and the bag or between your brace or immobilizer and the bag. ? Leave the ice on for 20 minutes, 2-3 times a day.  Move your toes often to avoid stiffness and to lessen swelling.  Raise (elevate) the injured area above the level of your heart while you are sitting or lying down. Driving  Do not drive until your health care provider approves. If you have a brace or immobilizer on your leg, ask  your health care provider when it is safe for you to drive.  Do not drive or use heavy machinery while taking prescription pain medicine. Activity  Rest as directed. Ask your health care provider what activities are safe for you.  Do physical therapy exercises as told by your health care provider. Physical therapy will help you regain strength and motion in your knee.  Follow instructions from your health care provider about: ? When you may start motion exercises. ? When you may start riding a stationary bike and doing other low-impact activities. ? When you may start to jog and do other high-impact activities. Safety  Do not use the injured limb to support your body weight until your health care provider says that you can. Use crutches as told by your health care provider. General instructions  Do not use any products that contain nicotine or tobacco, such as cigarettes and e-cigarettes. These can delay bone healing. If you need help quitting, ask your health care provider.  To prevent or treat constipation while you are taking prescription pain medicine, your health care provider may recommend that you: ? Drink enough fluid to keep your urine clear or pale yellow. ? Take over-the-counter or prescription medicines. ? Eat foods that are high in fiber, such as fresh fruits and vegetables, whole grains, and beans. ? Limit foods that are high in fat and processed sugars, such as fried and sweet foods.  Take over-the-counter and prescription medicines only as told by your health care provider.  Keep all follow-up visits as told by your health care provider. This is important. Contact a health care provider if:  You have more redness, swelling, or pain around an incision.  You have more fluid or blood coming from an incision.  Your incision feels warm to the touch.  You have a fever.  You have pain or swelling in your knee, and it gets worse.  You have pain that does not get  better with medicine. Get help right away if:  You have trouble breathing.  You have pus or a bad smell coming from an incision.  You have numbness and tingling near the knee joint. Summary  After the procedure, it is common to have knee pain with bruising and swelling on your knee, calf, and ankle.  Icing your knee and raising your leg above the level of your heart will help control the pain and the swelling.  Do physical therapy exercises as told by your health care provider. Physical therapy will help you regain strength and motion in your knee. This information is not intended to replace advice given to you by your health care provider. Make sure you discuss any  questions you have with your health care provider. Document Revised: 04/05/2017 Document Reviewed: 04/17/2016 Elsevier Patient Education  2020 [place] Anesthesia, Adult, Care After This sheet gives you information about how to care for yourself after your procedure. Your health care provider may also give you more specific instructions. If you have problems or questions, contact your health care provider. What can I expect after the procedure? After the procedure, the following side effects are common:  Pain or discomfort at the IV site.  Nausea.  Vomiting.  Sore throat.  Trouble concentrating.  Feeling cold or chills.  Weak or tired.  Sleepiness and fatigue.  Soreness and body aches. These side effects can affect parts of the body that were not involved in surgery. Follow these instructions at home:  For at least 24 hours after the procedure:  Have a responsible adult stay with you. It is important to have someone help care for you until you are awake and alert.  Rest as needed.  Do not: ? Participate in activities in which you could fall or become injured. ? Drive. ? Use heavy machinery. ? Drink alcohol. ? Take sleeping pills or medicines that cause drowsiness. ? Make important  decisions or sign legal documents. ? Take care of children on your own. Eating and drinking  Follow any instructions from your health care provider about eating or drinking restrictions.  When you feel hungry, start by eating small amounts of foods that are soft and easy to digest (bland), such as toast. Gradually return to your regular diet.  Drink enough fluid to keep your urine pale yellow.  If you vomit, rehydrate by drinking water, juice, or clear broth. General instructions  If you have sleep apnea, surgery and certain medicines can increase your risk for breathing problems. Follow instructions from your health care provider about wearing your sleep device: ? Anytime you are sleeping, including during daytime naps. ? While taking prescription pain medicines, sleeping medicines, or medicines that make you drowsy.  Return to your normal activities as told by your health care provider. Ask your health care provider what activities are safe for you.  Take over-the-counter and prescription medicines only as told by your health care provider.  If you smoke, do not smoke without supervision.  Keep all follow-up visits as told by your health care provider. This is important. Contact a health care provider if:  You have nausea or vomiting that does not get better with medicine.  You cannot eat or drink without vomiting.  You have pain that does not get better with medicine.  You are unable to pass urine.  You develop a skin rash.  You have a fever.  You have redness around your IV site that gets worse. Get help right away if:  You have difficulty breathing.  You have chest pain.  You have blood in your urine or stool, or you vomit blood. Summary  After the procedure, it is common to have a sore throat or nausea. It is also common to feel tired.  Have a responsible adult stay with you for the first 24 hours after general anesthesia. It is important to have someone help  care for you until you are awake and alert.  When you feel hungry, start by eating small amounts of foods that are soft and easy to digest (bland), such as toast. Gradually return to your regular diet.  Drink enough fluid to keep your urine pale yellow.  Return to your normal  activities as told by your health care provider. Ask your health care provider what activities are safe for you. This information is not intended to replace advice given to you by your health care provider. Make sure you discuss any questions you have with your health care provider. Document Revised: 04/26/2017 Document Reviewed: 12/07/2016 Elsevier Patient Education  [place]. How to Use Chlorhexidine for Bathing Chlorhexidine gluconate (CHG) is a germ-killing (antiseptic) solution that is used to clean the skin. It can get rid of the bacteria that normally live on the skin and can keep them away for about 24 hours. To clean your skin with CHG, you may be given:  A CHG solution to use in the shower or as part of a sponge bath.  A prepackaged cloth that contains CHG. Cleaning your skin with CHG may help lower the risk for infection:  While you are staying in the intensive care unit of the hospital.  If you have a vascular access, such as a central line, to provide short-term or long-term access to your veins.  If you have a catheter to drain urine from your bladder.  If you are on a ventilator. A ventilator is a machine that helps you breathe by moving air in and out of your lungs.  After surgery. What are the risks? Risks of using CHG include:  A skin reaction.  Hearing loss, if CHG gets in your ears.  Eye injury, if CHG gets in your eyes and is not rinsed out.  The CHG product catching fire. Make sure that you avoid smoking and flames after applying CHG to your skin. Do not use CHG:  If you have a chlorhexidine allergy or have previously reacted to chlorhexidine.  On babies younger than 49  months of age. How to use CHG solution  Use CHG only as told by your health care provider, and follow the instructions on the label.  Use the full amount of CHG as directed. Usually, this is one bottle. During a shower Follow these steps when using CHG solution during a shower (unless your health care provider gives you different instructions): 1. Start the shower. 2. Use your normal soap and shampoo to wash your face and hair. 3. Turn off the shower or move out of the shower stream. 4. Pour the CHG onto a clean washcloth. Do not use any type of brush or rough-edged sponge. 5. Starting at your neck, lather your body down to your toes. Make sure you follow these instructions: ? If you will be having surgery, pay special attention to the part of your body where you will be having surgery. Scrub this area for at least 1 minute. ? Do not use CHG on your head or face. If the solution gets into your ears or eyes, rinse them well with water. ? Avoid your genital area. ? Avoid any areas of skin that have broken skin, cuts, or scrapes. ? Scrub your back and under your arms. Make sure to wash skin folds. 6. Let the lather sit on your skin for 1-2 minutes or as long as told by your health care provider. 7. Thoroughly rinse your entire body in the shower. Make sure that all body creases and crevices are rinsed well. 8. Dry off with a clean towel. Do not put any substances on your body afterward--such as powder, lotion, or perfume--unless you are told to do so by your health care provider. Only use lotions that are recommended by the manufacturer. 9. Put  on clean clothes or pajamas. 10. If it is the night before your surgery, sleep in clean sheets.  During a sponge bath Follow these steps when using CHG solution during a sponge bath (unless your health care provider gives you different instructions): 1. Use your normal soap and shampoo to wash your face and hair. 2. Pour the CHG onto a clean  washcloth. 3. Starting at your neck, lather your body down to your toes. Make sure you follow these instructions: ? If you will be having surgery, pay special attention to the part of your body where you will be having surgery. Scrub this area for at least 1 minute. ? Do not use CHG on your head or face. If the solution gets into your ears or eyes, rinse them well with water. ? Avoid your genital area. ? Avoid any areas of skin that have broken skin, cuts, or scrapes. ? Scrub your back and under your arms. Make sure to wash skin folds. 4. Let the lather sit on your skin for 1-2 minutes or as long as told by your health care provider. 5. Using a different clean, wet washcloth, thoroughly rinse your entire body. Make sure that all body creases and crevices are rinsed well. 6. Dry off with a clean towel. Do not put any substances on your body afterward--such as powder, lotion, or perfume--unless you are told to do so by your health care provider. Only use lotions that are recommended by the manufacturer. 7. Put on clean clothes or pajamas. 8. If it is the night before your surgery, sleep in clean sheets. How to use CHG prepackaged cloths  Only use CHG cloths as told by your health care provider, and follow the instructions on the label.  Use the CHG cloth on clean, dry skin.  Do not use the CHG cloth on your head or face unless your health care provider tells you to.  When washing with the CHG cloth: ? Avoid your genital area. ? Avoid any areas of skin that have broken skin, cuts, or scrapes. Before surgery Follow these steps when using a CHG cloth to clean before surgery (unless your health care provider gives you different instructions): 1. Using the CHG cloth, vigorously scrub the part of your body where you will be having surgery. Scrub using a back-and-forth motion for 3 minutes. The area on your body should be completely wet with CHG when you are done scrubbing. 2. Do not rinse. Discard  the cloth and let the area air-dry. Do not put any substances on the area afterward, such as powder, lotion, or perfume. 3. Put on clean clothes or pajamas. 4. If it is the night before your surgery, sleep in clean sheets.  For general bathing Follow these steps when using CHG cloths for general bathing (unless your health care provider gives you different instructions). 1. Use a separate CHG cloth for each area of your body. Make sure you wash between any folds of skin and between your fingers and toes. Wash your body in the following order, switching to a new cloth after each step: ? The front of your neck, shoulders, and chest. ? Both of your arms, under your arms, and your hands. ? Your stomach and groin area, avoiding the genitals. ? Your right leg and foot. ? Your left leg and foot. ? The back of your neck, your back, and your buttocks. 2. Do not rinse. Discard the cloth and let the area air-dry. Do not put any substances  on your body afterward--such as powder, lotion, or perfume--unless you are told to do so by your health care provider. Only use lotions that are recommended by the manufacturer. 3. Put on clean clothes or pajamas. Contact a health care provider if:  Your skin gets irritated after scrubbing.  You have questions about using your solution or cloth. Get help right away if:  Your eyes become very red or swollen.  Your eyes itch badly.  Your skin itches badly and is red or swollen.  Your hearing changes.  You have trouble seeing.  You have swelling or tingling in your mouth or throat.  You have trouble breathing.  You swallow any chlorhexidine. Summary  Chlorhexidine gluconate (CHG) is a germ-killing (antiseptic) solution that is used to clean the skin. Cleaning your skin with CHG may help to lower your risk for infection.  You may be given CHG to use for bathing. It may be in a bottle or in a prepackaged cloth to use on your skin. Carefully follow your  health care provider's instructions and the instructions on the product label.  Do not use CHG if you have a chlorhexidine allergy.  Contact your health care provider if your skin gets irritated after scrubbing. This information is not intended to replace advice given to you by your health care provider. Make sure you discuss any questions you have with your health care provider. Document Revised: 07/10/2018 Document Reviewed: 03/21/2017 Elsevier Patient Education  [place].

## 2019-07-22 ENCOUNTER — Other Ambulatory Visit: Payer: Self-pay

## 2019-07-22 ENCOUNTER — Encounter (HOSPITAL_COMMUNITY): Payer: Self-pay

## 2019-07-22 ENCOUNTER — Encounter (HOSPITAL_COMMUNITY)
Admission: RE | Admit: 2019-07-22 | Discharge: 2019-07-22 | Disposition: A | Discharge status: Home / Self Care | Payer: Medicare HMO | Source: Ambulatory Visit | Attending: Orthopedic Surgery | Admitting: Orthopedic Surgery

## 2019-07-22 DIAGNOSIS — Z01812 Encounter for preprocedural laboratory examination: Secondary | ICD-10-CM | POA: Diagnosis not present

## 2019-07-22 LAB — HCG, SERUM, QUALITATIVE: Preg, Serum: NEGATIVE

## 2019-07-22 LAB — CBC WITH DIFFERENTIAL/PLATELET
Abs Immature Granulocytes: 0.01 10*3/uL (ref 0.00–0.07)
Basophils Absolute: 0.1 10*3/uL (ref 0.0–0.1)
Basophils Relative: 1 %
Eosinophils Absolute: 0.3 10*3/uL (ref 0.0–0.5)
Eosinophils Relative: 5 %
HCT: 32.3 % — ABNORMAL LOW (ref 36.0–46.0)
Hemoglobin: 9.9 g/dL — ABNORMAL LOW (ref 12.0–15.0)
Immature Granulocytes: 0 %
Lymphocytes Relative: 27 %
Lymphs Abs: 1.8 10*3/uL (ref 0.7–4.0)
MCH: 24.5 pg — ABNORMAL LOW (ref 26.0–34.0)
MCHC: 30.7 g/dL (ref 30.0–36.0)
MCV: 80 fL (ref 80.0–100.0)
Monocytes Absolute: 0.8 10*3/uL (ref 0.1–1.0)
Monocytes Relative: 12 %
Neutro Abs: 3.9 10*3/uL (ref 1.7–7.7)
Neutrophils Relative %: 55 %
Platelets: 326 10*3/uL (ref 150–400)
RBC: 4.04 MIL/uL (ref 3.87–5.11)
RDW: 25.1 % — ABNORMAL HIGH (ref 11.5–15.5)
WBC: 6.9 10*3/uL (ref 4.0–10.5)
nRBC: 0 % (ref 0.0–0.2)

## 2019-07-22 LAB — BASIC METABOLIC PANEL
Anion gap: 9 (ref 5–15)
BUN: 11 mg/dL (ref 6–20)
CO2: 23 mmol/L (ref 22–32)
Calcium: 9.5 mg/dL (ref 8.9–10.3)
Chloride: 107 mmol/L (ref 98–111)
Creatinine, Ser: 0.62 mg/dL (ref 0.44–1.00)
GFR calc Af Amer: >60 mL/min (ref >60)
GFR calc non Af Amer: >60 mL/min (ref >60)
Glucose, Bld: 86 mg/dL (ref 70–99)
Potassium: 3.9 mmol/L (ref 3.5–5.1)
Sodium: 139 mmol/L (ref 135–145)

## 2019-07-22 NOTE — H&P (Signed)
Chief Complaint  Patient presents with  . Knee Pain      right       History 49 year old female previous patellofemoral realignment surgery including tibial tubercle presents with chronic pain right knee having trouble sleeping at night.  She says she can go up the stairs okay but when she tries to go down the stairs she feels increased pain and crepitance in the knee.  She is not having giving out symptoms catching or locking but feels like the knee could give out at any time       She actually says she wanted to have a total knee.  We talked about that as well and that is not a good option with her age history of back problems radicular symptoms in the right leg   She did try bracing ejection ice heat anti-inflammatories steroids with no relief   Review of systems history of lower back pain right leg radiculopathy sensory changes in the lower extremities from the back issue   No fever or chills       Past Medical History:  Diagnosis Date  . Chronic back pain      both legs;DDD and stenosis   . Complication of anesthesia      had extreme headache after last back surgery - was in pacu for extended period  . Muscle spasm      takes Valium daily as needed  . Neuromuscular disorder ([place])    . PONV (postoperative nausea and vomiting)    . Rheumatoid arthritis [facility])           Past Surgical History:  Procedure Laterality Date  . BACK SURGERY        at surgical center  . KNEE ARTHROSCOPY   08/17/2011    Procedure: ARTHROSCOPY KNEE;  Surgeon: Carole Civil, MD;  Location: [facility];  Service: Orthopedics;  Laterality: Right;  with tibia tubercle transfer and proximal realignment  . KNEE ARTHROSCOPY WITH FULKERSON SLIDE Left 08/29/2012    Procedure: Arthroscopic lateral release with chondroplasty left knee;  Surgeon: Carole Civil, MD;  Location: [facility];  Service: Orthopedics;  Laterality: Left;  . KNEE ARTHROSCOPY WITH MEDIAL PATELLAR FEMORAL LIGAMENT RECONSTRUCTION Left  08/29/2012    Procedure: Open proximal realignment fulkerson;  Surgeon: Carole Civil, MD;  Location: [facility];  Service: Orthopedics;  Laterality: Left;  . KNEE SURGERY   2012    right knee-arthroscopy  . LUMBAR LAMINECTOMY/DECOMPRESSION MICRODISCECTOMY Right 06/11/2014    Procedure: Right L4-5 Lumbar diskectomy;  Surgeon: Floyce Stakes, MD;  Location: [facility];  Service: Neurosurgery;  Laterality: Right;  Right L4-5 Lumbar diskectomy  . right arm   2008    rods from fractured arm  . TUBAL LIGATION   1995      Social History   Tobacco Use  . Smoking status: Current Every Day Smoker    Packs/day: 1.00    Years: 20.00    Pack years: 20.00    Types: Cigarettes  . Smokeless tobacco: Never Used  Substance Use Topics  . Alcohol use: Yes    Comment: OCCASIONALLY   . Drug use: No    Family History  Problem Relation Age of Onset  . Arthritis Other        family history   . Hypertension Mother   . Seizures Son   . Healthy Son   . Healthy Son   . Healthy Daughter   . Healthy Sister   . Hypertension  Brother   . Hypertension Maternal Grandmother   . Hypertension Paternal Grandmother   . Healthy Sister   . Healthy Sister   . Healthy Sister     Allergies  Allergen Reactions  . Tramadol Itching and Nausea Only    BP (!) 151/96   Pulse 77   Ht 5' 2"  (1.575 m)   Wt 139 lb (63 kg)   BMI 25.42 kg/m    Awake and alert oriented x3 mood and affect normal body habitus normal   Right knee hyperextends 15 degrees.  She has a scar on the medial side of the knee from the patellofemoral realignment surgery and then the distal scar for the tibial tubercle procedure   There is a joint effusion she has crepitance and tenderness of the patella facets medially and laterally with positive grind test positive contraction test medial lateral glide tests show looseness of the patella especially laterally   Tracking shows a J sign at 30 degrees she is back in the groove    Pseudolaxity of the knee joint is noted from the ligament laxity syndrome   Full range of motion flexion is noted collateral ligaments are stable   Vascular exam is intact         Encounter Diagnoses  Name Primary?  . Chronic pain of right knee    . Chondromalacia, patella, right Yes  . Patellar instability of right knee      Plan is to do a diagnostic arthroscopy of the right knee with a chondroplasty as needed on the patella  We will check tracking under anesthesia as well as all ligaments.

## 2019-07-23 ENCOUNTER — Encounter (HOSPITAL_COMMUNITY): Payer: Self-pay | Admitting: Orthopedic Surgery

## 2019-07-23 ENCOUNTER — Ambulatory Visit (HOSPITAL_COMMUNITY): Payer: Medicare HMO | Admitting: Anesthesiology

## 2019-07-23 ENCOUNTER — Encounter (HOSPITAL_COMMUNITY)
Admission: RE | Disposition: A | Discharge status: Home / Self Care | Payer: Self-pay | Source: Home / Self Care | Attending: Orthopedic Surgery

## 2019-07-23 ENCOUNTER — Ambulatory Visit (HOSPITAL_COMMUNITY)
Admission: RE | Admit: 2019-07-23 | Discharge: 2019-07-23 | Disposition: A | Discharge status: Home / Self Care | Payer: Medicare HMO | Attending: Orthopedic Surgery | Admitting: Orthopedic Surgery

## 2019-07-23 ENCOUNTER — Encounter: Payer: Self-pay | Admitting: Orthopedic Surgery

## 2019-07-23 DIAGNOSIS — M2241 Chondromalacia patellae, right knee: Secondary | ICD-10-CM | POA: Diagnosis present

## 2019-07-23 DIAGNOSIS — M25361 Other instability, right knee: Secondary | ICD-10-CM | POA: Insufficient documentation

## 2019-07-23 DIAGNOSIS — Z9889 Other specified postprocedural states: Secondary | ICD-10-CM | POA: Diagnosis not present

## 2019-07-23 DIAGNOSIS — F17210 Nicotine dependence, cigarettes, uncomplicated: Secondary | ICD-10-CM | POA: Diagnosis not present

## 2019-07-23 HISTORY — PX: CHONDROPLASTY: SHX5177

## 2019-07-23 HISTORY — PX: KNEE ARTHROSCOPY: SHX127

## 2019-07-23 SURGERY — ARTHROSCOPY, KNEE
Anesthesia: General | Site: Knee | Laterality: Right

## 2019-07-23 MED ORDER — FENTANYL CITRATE (PF) 100 MCG/2ML IJ SOLN: 25.0000 ug | INTRAMUSCULAR | Status: DC | PRN

## 2019-07-23 MED ORDER — SCOPOLAMINE 1 MG/3DAYS TD PT72
1.0000 | MEDICATED_PATCH | TRANSDERMAL | Status: DC
  Administered 2019-07-23: 1.5 mg via TRANSDERMAL

## 2019-07-23 MED ORDER — KETOROLAC TROMETHAMINE 30 MG/ML IJ SOLN
30.0000 mg | Freq: Once | INTRAMUSCULAR | Status: AC
  Administered 2019-07-23: 30 mg via INTRAVENOUS
  Filled 2019-07-23: qty 1

## 2019-07-23 MED ORDER — PROPOFOL 10 MG/ML IV BOLUS
INTRAVENOUS | Status: AC
  Filled 2019-07-23: qty 20

## 2019-07-23 MED ORDER — ONDANSETRON HCL 4 MG/2ML IJ SOLN
INTRAMUSCULAR | Status: DC | PRN
  Administered 2019-07-23: 4 mg via INTRAVENOUS

## 2019-07-23 MED ORDER — MEPERIDINE HCL 50 MG/ML IJ SOLN: 6.2500 mg | INTRAMUSCULAR | Status: DC | PRN

## 2019-07-23 MED ORDER — LIDOCAINE 2% (20 MG/ML) 5 ML SYRINGE
INTRAMUSCULAR | Status: AC
  Filled 2019-07-23: qty 5

## 2019-07-23 MED ORDER — EPINEPHRINE PF 1 MG/ML IJ SOLN
INTRAMUSCULAR | Status: AC
  Filled 2019-07-23: qty 5

## 2019-07-23 MED ORDER — EPHEDRINE SULFATE 50 MG/ML IJ SOLN
INTRAMUSCULAR | Status: DC | PRN
  Administered 2019-07-23 (×2): 10 mg via INTRAVENOUS

## 2019-07-23 MED ORDER — OXYCODONE HCL 5 MG PO TABS
5.0000 mg | ORAL_TABLET | Freq: Once | ORAL | Status: AC
  Administered 2019-07-23: 15:00:00 5 mg via ORAL
  Filled 2019-07-23: qty 1

## 2019-07-23 MED ORDER — SODIUM CHLORIDE 0.9 % IR SOLN
Status: DC | PRN
  Administered 2019-07-23 (×7): 1000 mL

## 2019-07-23 MED ORDER — OXYCODONE-ACETAMINOPHEN 5-325 MG PO TABS: 1.0000 | ORAL_TABLET | Freq: Four times a day (QID) | ORAL | 0 refills | Status: AC | PRN

## 2019-07-23 MED ORDER — EPHEDRINE 5 MG/ML INJ
INTRAVENOUS | Status: AC
  Filled 2019-07-23: qty 10

## 2019-07-23 MED ORDER — CHLORHEXIDINE GLUCONATE 4 % EX LIQD: 60.0000 mL | Freq: Once | CUTANEOUS | Status: DC

## 2019-07-23 MED ORDER — MIDAZOLAM HCL 2 MG/2ML IJ SOLN
INTRAMUSCULAR | Status: AC
  Filled 2019-07-23: qty 2

## 2019-07-23 MED ORDER — LIDOCAINE 2% (20 MG/ML) 5 ML SYRINGE
INTRAMUSCULAR | Status: DC | PRN
  Administered 2019-07-23: 60 mg via INTRAVENOUS

## 2019-07-23 MED ORDER — DEXAMETHASONE SODIUM PHOSPHATE 10 MG/ML IJ SOLN
INTRAMUSCULAR | Status: DC | PRN
  Administered 2019-07-23: 5 mg via INTRAVENOUS

## 2019-07-23 MED ORDER — FENTANYL CITRATE (PF) 250 MCG/5ML IJ SOLN
INTRAMUSCULAR | Status: AC
  Filled 2019-07-23: qty 5

## 2019-07-23 MED ORDER — PROPOFOL 10 MG/ML IV BOLUS
INTRAVENOUS | Status: DC | PRN
  Administered 2019-07-23: 140 mg via INTRAVENOUS

## 2019-07-23 MED ORDER — BUPIVACAINE-EPINEPHRINE (PF) 0.5% -1:200000 IJ SOLN
INTRAMUSCULAR | Status: DC | PRN
  Administered 2019-07-23: 60 mL

## 2019-07-23 MED ORDER — MIDAZOLAM HCL 5 MG/5ML IJ SOLN
INTRAMUSCULAR | Status: DC | PRN
  Administered 2019-07-23: 2 mg via INTRAVENOUS

## 2019-07-23 MED ORDER — CEFAZOLIN SODIUM-DEXTROSE 2-4 GM/100ML-% IV SOLN
INTRAVENOUS | Status: AC
  Filled 2019-07-23: qty 100

## 2019-07-23 MED ORDER — EPINEPHRINE PF 1 MG/ML IJ SOLN
INTRAMUSCULAR | Status: AC
  Filled 2019-07-23: qty 4

## 2019-07-23 MED ORDER — SCOPOLAMINE 1 MG/3DAYS TD PT72
MEDICATED_PATCH | TRANSDERMAL | Status: AC
  Filled 2019-07-23: qty 1

## 2019-07-23 MED ORDER — ONDANSETRON HCL 4 MG/2ML IJ SOLN: 4.0000 mg | Freq: Once | INTRAMUSCULAR | Status: DC

## 2019-07-23 MED ORDER — LACTATED RINGERS IV SOLN: Freq: Once | INTRAVENOUS | Status: AC

## 2019-07-23 MED ORDER — IBUPROFEN 800 MG PO TABS: 800.0000 mg | ORAL_TABLET | Freq: Three times a day (TID) | ORAL | 0 refills | Status: DC | PRN

## 2019-07-23 MED ORDER — HYDROMORPHONE HCL 1 MG/ML IJ SOLN
INTRAMUSCULAR | Status: AC
  Filled 2019-07-23: qty 1

## 2019-07-23 MED ORDER — BUPIVACAINE-EPINEPHRINE (PF) 0.5% -1:200000 IJ SOLN
INTRAMUSCULAR | Status: AC
  Filled 2019-07-23: qty 60

## 2019-07-23 MED ORDER — PROMETHAZINE HCL 12.5 MG PO TABS: 12.5000 mg | ORAL_TABLET | Freq: Four times a day (QID) | ORAL | 0 refills | Status: DC | PRN

## 2019-07-23 MED ORDER — ACETAMINOPHEN 500 MG PO TABS: 1000.0000 mg | ORAL_TABLET | Freq: Once | ORAL | Status: DC

## 2019-07-23 MED ORDER — LACTATED RINGERS IV SOLN: INTRAVENOUS | Status: DC | PRN

## 2019-07-23 MED ORDER — CEFAZOLIN SODIUM-DEXTROSE 2-4 GM/100ML-% IV SOLN
2.0000 g | INTRAVENOUS | Status: AC
  Administered 2019-07-23: 2 g via INTRAVENOUS

## 2019-07-23 MED ORDER — FENTANYL CITRATE (PF) 100 MCG/2ML IJ SOLN
INTRAMUSCULAR | Status: DC | PRN
  Administered 2019-07-23: 25 ug via INTRAVENOUS
  Administered 2019-07-23: 50 ug via INTRAVENOUS
  Administered 2019-07-23: 25 ug via INTRAVENOUS

## 2019-07-23 MED ORDER — HYDROMORPHONE HCL 1 MG/ML IJ SOLN
0.5000 mg | INTRAMUSCULAR | Status: DC | PRN
  Administered 2019-07-23: 0.5 mg via INTRAVENOUS

## 2019-07-23 MED ORDER — PROMETHAZINE HCL 25 MG/ML IJ SOLN: 6.2500 mg | INTRAMUSCULAR | Status: DC | PRN

## 2019-07-23 SURGICAL SUPPLY — 56 items
APL PRP STRL LF DISP 70% ISPRP (MISCELLANEOUS) ×1
ARTHROWAND PARAGON T2 (SURGICAL WAND) ×3
BANDAGE ELASTIC 6 LF NS (GAUZE/BANDAGES/DRESSINGS) ×2 IMPLANT
BLADE AGGRESSIVE PLUS 4.0 (BLADE) ×3 IMPLANT
BLADE SURG SZ11 CARB STEEL (BLADE) ×3 IMPLANT
BNDG CMPR MED 5X6 ELC HKLP NS (GAUZE/BANDAGES/DRESSINGS) ×1
BNDG CMPR STD VLCR NS LF 5.8X6 (GAUZE/BANDAGES/DRESSINGS) ×1
BNDG ELASTIC 6X5.8 VLCR NS LF (GAUZE/BANDAGES/DRESSINGS) ×3 IMPLANT
CHLORAPREP W/TINT 26 (MISCELLANEOUS) ×3 IMPLANT
CLOTH BEACON ORANGE TIMEOUT ST (SAFETY) ×3 IMPLANT
COOLER CRYO IC GRAV AND TUBE (ORTHOPEDIC SUPPLIES) ×3 IMPLANT
COVER WAND RF STERILE (DRAPES) ×3 IMPLANT
CUFF CRYO KNEE18X23 MED (MISCELLANEOUS) ×2 IMPLANT
CUFF TOURN SGL QUICK 34 (TOURNIQUET CUFF) ×6
CUFF TRNQT CYL 34X4.125X (TOURNIQUET CUFF) ×2 IMPLANT
DECANTER SPIKE VIAL GLASS SM (MISCELLANEOUS) ×6 IMPLANT
DRSG XEROFORM 1X8 (GAUZE/BANDAGES/DRESSINGS) ×2 IMPLANT
GAUZE 4X4 16PLY RFD (DISPOSABLE) ×3 IMPLANT
GAUZE SPONGE 4X4 12PLY STRL (GAUZE/BANDAGES/DRESSINGS) ×2 IMPLANT
GAUZE SPONGE 4X4 16PLY XRAY LF (GAUZE/BANDAGES/DRESSINGS) ×3 IMPLANT
GAUZE XEROFORM 5X9 LF (GAUZE/BANDAGES/DRESSINGS) ×3 IMPLANT
GLOVE BIOGEL M 7.0 STRL (GLOVE) ×2 IMPLANT
GLOVE BIOGEL PI IND STRL 7.0 (GLOVE) ×2 IMPLANT
GLOVE BIOGEL PI INDICATOR 7.0 (GLOVE) ×4
GLOVE SKINSENSE NS SZ8.0 LF (GLOVE) ×2
GLOVE SKINSENSE STRL SZ8.0 LF (GLOVE) ×1 IMPLANT
GLOVE SS N UNI LF 8.5 STRL (GLOVE) ×3 IMPLANT
GOWN STRL REUS W/TWL LRG LVL3 (GOWN DISPOSABLE) ×3 IMPLANT
GOWN STRL REUS W/TWL XL LVL3 (GOWN DISPOSABLE) ×3 IMPLANT
IV NS IRRIG 3000ML ARTHROMATIC (IV SOLUTION) ×6 IMPLANT
KIT BLADEGUARD II DBL (SET/KITS/TRAYS/PACK) ×3 IMPLANT
KIT TURNOVER CYSTO (KITS) ×3 IMPLANT
MANIFOLD NEPTUNE II (INSTRUMENTS) ×3 IMPLANT
MARKER SKIN DUAL TIP RULER LAB (MISCELLANEOUS) ×3 IMPLANT
NDL HYPO 18GX1.5 BLUNT FILL (NEEDLE) ×1 IMPLANT
NDL HYPO 21X1.5 SAFETY (NEEDLE) ×1 IMPLANT
NDL SPNL 18GX3.5 QUINCKE PK (NEEDLE) ×1 IMPLANT
NEEDLE HYPO 18GX1.5 BLUNT FILL (NEEDLE) ×3 IMPLANT
NEEDLE HYPO 21X1.5 SAFETY (NEEDLE) ×3 IMPLANT
NEEDLE SPNL 18GX3.5 QUINCKE PK (NEEDLE) ×3 IMPLANT
NS IRRIG 1000ML POUR BTL (IV SOLUTION) ×3 IMPLANT
PACK ARTHRO LIMB DRAPE STRL (MISCELLANEOUS) ×3 IMPLANT
PAD ABD 5X9 TENDERSORB (GAUZE/BANDAGES/DRESSINGS) ×3 IMPLANT
PAD ARMBOARD 7.5X6 YLW CONV (MISCELLANEOUS) ×3 IMPLANT
PADDING CAST COTTON 6X4 STRL (CAST SUPPLIES) ×3 IMPLANT
PADDING WEBRIL 6 STERILE (GAUZE/BANDAGES/DRESSINGS) ×2 IMPLANT
PROBE BIPOLAR ATHRO 135MM 90D (MISCELLANEOUS) ×2 IMPLANT
SET ARTHROSCOPY INST (INSTRUMENTS) ×3 IMPLANT
SET BASIN LINEN APH (SET/KITS/TRAYS/PACK) ×3 IMPLANT
SUT ETHILON 3 0 FSL (SUTURE) ×3 IMPLANT
SYR 30ML LL (SYRINGE) ×3 IMPLANT
SYR CONTROL 10ML LL (SYRINGE) ×3 IMPLANT
TUBE CONNECTING 12'X1/4 (SUCTIONS) ×3
TUBE CONNECTING 12X1/4 (SUCTIONS) ×7 IMPLANT
TUBING ARTHRO INFLOW-ONLY STRL (TUBING) ×3 IMPLANT
WAND ARTHRO PARAGON T2 (SURGICAL WAND) IMPLANT

## 2019-07-23 NOTE — Discharge Instructions (Signed)
Use cryo cuff ice pack 30 mins every 2 hours for the first 7 days while awake  May remove ace wrap tomorrow and apply bandaids Weight bearing as tolerated with crutches  Follow up appointment 07/31/2019 at 9:30 am  General Anesthesia, Adult, Care After This sheet gives you information about how to care for yourself after your procedure. Your health care provider may also give you more specific instructions. If you have problems or questions, contact your health care provider. What can I expect after the procedure? After the procedure, the following side effects are common:  Pain or discomfort at the IV site.  Nausea.  Vomiting.  Sore throat.  Trouble concentrating.  Feeling cold or chills.  Weak or tired.  Sleepiness and fatigue.  Soreness and body aches. These side effects can affect parts of the body that were not involved in surgery. Follow these instructions at home:  For at least 24 hours after the procedure:  Have a responsible adult stay with you. It is important to have someone help care for you until you are awake and alert.  Rest as needed.  Do not: ? Participate in activities in which you could fall or become injured. ? Drive. ? Use heavy machinery. ? Drink alcohol. ? Take sleeping pills or medicines that cause drowsiness. ? Make important decisions or sign legal documents. ? Take care of children on your own. Eating and drinking  Follow any instructions from your health care provider about eating or drinking restrictions.  When you feel hungry, start by eating small amounts of foods that are soft and easy to digest (bland), such as toast. Gradually return to your regular diet.  Drink enough fluid to keep your urine pale yellow.  If you vomit, rehydrate by drinking water, juice, or clear broth. General instructions  If you have sleep apnea, surgery and certain medicines can increase your risk for breathing problems. Follow instructions from your health  care provider about wearing your sleep device: ? Anytime you are sleeping, including during daytime naps. ? While taking prescription pain medicines, sleeping medicines, or medicines that make you drowsy.  Return to your normal activities as told by your health care provider. Ask your health care provider what activities are safe for you.  Take over-the-counter and prescription medicines only as told by your health care provider.  If you smoke, do not smoke without supervision.  Keep all follow-up visits as told by your health care provider. This is important. Contact a health care provider if:  You have nausea or vomiting that does not get better with medicine.  You cannot eat or drink without vomiting.  You have pain that does not get better with medicine.  You are unable to pass urine.  You develop a skin rash.  You have a fever.  You have redness around your IV site that gets worse. Get help right away if:  You have difficulty breathing.  You have chest pain.  You have blood in your urine or stool, or you vomit blood. Summary  After the procedure, it is common to have a sore throat or nausea. It is also common to feel tired.  Have a responsible adult stay with you for the first 24 hours after general anesthesia. It is important to have someone help care for you until you are awake and alert.  When you feel hungry, start by eating small amounts of foods that are soft and easy to digest (bland), such as toast. Gradually return to  your regular diet.  Drink enough fluid to keep your urine pale yellow.  Return to your normal activities as told by your health care provider. Ask your health care provider what activities are safe for you. This information is not intended to replace advice given to you by your health care provider. Make sure you discuss any questions you have with your health care provider. Document Revised: 04/26/2017 Document Reviewed: 12/07/2016 Elsevier  Patient Education  [place].

## 2019-07-23 NOTE — Anesthesia Postprocedure Evaluation (Signed)
Anesthesia Post Note  Patient: Karen Mercer  Procedure(s) Performed: ARTHROSCOPY RIGHT KNEE (Right Knee) CHONDROPLASTY RIGHT PATELLA  AND LATERAL FEMUR (Right Knee)  Patient location during evaluation: PACU Anesthesia Type: General Level of consciousness: awake and alert and oriented Pain management: pain level controlled Vital Signs Assessment: post-procedure vital signs reviewed and stable Respiratory status: spontaneous breathing Cardiovascular status: blood pressure returned to baseline and stable Postop Assessment: no apparent nausea or vomiting Anesthetic complications: no     Last Vitals:  Vitals:   07/23/19 1127 07/23/19 1345  BP: (!) 154/89 (!) 180/93  Pulse: (!) 50 (!) 56  Resp: 17 12  Temp: 36.9 C 36.8 C  SpO2: 100% 100%    Last Pain:  Vitals:   07/23/19 1345  TempSrc:   PainSc: 8                  Saria Haran

## 2019-07-23 NOTE — Anesthesia Preprocedure Evaluation (Signed)
Anesthesia Evaluation  Patient identified by MRN, date of birth, ID band Patient awake    Reviewed: Allergy & Precautions, NPO status , Patient's Chart, lab work & pertinent test results  History of Anesthesia Complications (+) PONV and history of anesthetic complications  Airway Mallampati: II  TM Distance: >3 FB Neck ROM: Full    Dental no notable dental hx. (+) Teeth Intact   Pulmonary Current Smoker and Patient abstained from smoking.,    Pulmonary exam normal breath sounds clear to auscultation       Cardiovascular Exercise Tolerance: Good  Rhythm:Regular Rate:Bradycardia     Neuro/Psych  Neuromuscular disease    GI/Hepatic negative GI ROS, Neg liver ROS,   Endo/Other  negative endocrine ROS  Renal/GU negative Renal ROS     Musculoskeletal  (+) Arthritis  (back pain), Back pain   Abdominal   Peds  Hematology  (+) anemia ,   Anesthesia Other Findings   Reproductive/Obstetrics                             Anesthesia Physical Anesthesia Plan  ASA: II  Anesthesia Plan: General   Post-op Pain Management:    Induction: Intravenous  PONV Risk Score and Plan: 4 or greater and Ondansetron, Dexamethasone, Midazolam and Scopolamine patch - Pre-op  Airway Management Planned: LMA  Additional Equipment:   Intra-op Plan:   Post-operative Plan: Extubation in OR  Informed Consent: I have reviewed the patients History and Physical, chart, labs and discussed the procedure including the risks, benefits and alternatives for the proposed anesthesia with the patient or authorized representative who has indicated his/her understanding and acceptance.     Dental advisory given  Plan Discussed with: CRNA  Anesthesia Plan Comments:         Anesthesia Quick Evaluation

## 2019-07-23 NOTE — Interval H&P Note (Signed)
History and Physical Interval Note:  07/23/2019 12:31 PM  Karen Mercer  has presented today for surgery, with the diagnosis of chondromalacia right patella.  The various methods of treatment have been discussed with the patient and family. After consideration of risks, benefits and other options for treatment, the patient has consented to  Procedure(s): ARTHROSCOPY RIGHT KNEE (Right) CHONDROPLASTY RIGHT PATELLA as a surgical intervention.  The patient's history has been reviewed, patient examined, no change in status, stable for surgery.  I have reviewed the patient's chart and labs.  Questions were answered to the patient's satisfaction.     Arther Abbott

## 2019-07-23 NOTE — Anesthesia Procedure Notes (Signed)
Procedure Name: LMA Insertion Date/Time: 07/23/2019 12:43 PM Performed by: Alain Marion, CRNA Pre-anesthesia Checklist: Patient identified, Emergency Drugs available, Suction available and Patient being monitored Patient Re-evaluated:Patient Re-evaluated prior to induction Oxygen Delivery Method: Circle System Utilized Preoxygenation: Pre-oxygenation with 100% oxygen Induction Type: IV induction Ventilation: Mask ventilation without difficulty LMA: LMA inserted LMA Size: 4.0 Number of attempts: 1 Airway Equipment and Method: Bite block Placement Confirmation: positive ETCO2 Tube secured with: Tape Dental Injury: Teeth and Oropharynx as per pre-operative assessment

## 2019-07-23 NOTE — Op Note (Signed)
07/23/2019  1:42 PM  PATIENT:  Karen Mercer  49 y.o. female  PRE-OPERATIVE DIAGNOSIS:  chondromalacia right patella  POST-OPERATIVE DIAGNOSIS:  chondromalacia right patella  PROCEDURE:  Procedure(s): ARTHROSCOPY RIGHT KNEE (Right) CHONDROPLASTY RIGHT PATELLA  AND LATERAL FEMUR (Right) - [id number]  Surgical findings: The patient had 20 degrees of hyperextension in both knees under anesthesia.  ACL PCL posterior cruciate ligament collateral ligaments stable under anesthesia.  Intraoperatively we found chondromalacia of the lateral facet of the patella medial facet of the patella central trochlea and lateral femoral condyle and medial femoral condyle the following grades respectively, lateral facet to, medial facet 3, central trochlea 3, lateral femoral condyle 3, diffuse grade 2  The patient has significant subluxation of the patella which corrects with knee flexion  SURGEON:  Surgeon(s) and Role:    Karen Civil, MD - Primary  PHYSICIAN ASSISTANT:   ASSISTANTS: none   ANESTHESIA:   general  EBL:  25 mL   BLOOD ADMINISTERED:none  DRAINS: none   LOCAL MEDICATIONS USED:  NONE  SPECIMEN:  No Specimen  DISPOSITION OF SPECIMEN:  N/A  COUNTS:  YES  TOURNIQUET:  * Missing tourniquet times found for documented tourniquets in log: [id number] *  DICTATION: .Viviann Spare Dictation  PLAN OF CARE: Discharge to home after PACU  PATIENT DISPOSITION:  PACU - hemodynamically stable.   Delay start of Pharmacological VTE agent (>24hrs) due to surgical blood loss or risk of bleeding: not applicable  The patient was seen in the preop area and identified as Karen Mercer.  She surgical site was marked as right knee  Patient was taken to the operating room given appropriate Ancef IV placed in supine position where general anesthesia was administered  I did an exam under anesthesia I listed the findings above  After sterile prep and drape and timeout I made a lateral portal and placed  the scope into the joint.  I immediately encountered a chondral lesion on the lateral facet of the patella as well as the medial facet of the patella.  A large amount of subluxation was noted but this corrected with knee flexion.  Through a medial portal I performed a chondroplasty with a Paragon wand and 90 degree wand debriding tissue as needed and using the wand for coagulation as well.  I then evaluated the ACL and PCL which were intact medial lateral menisci were intact.  There was diffuse chondral lesion of the medial femoral condyle as stated above  Lateral femoral condyle had 2 distinct lesions the more proximal lesion was a stellate pattern of chondral injury and then the more distal lesion on the lateral femoral cartilage was a more diffuse lesion.  These were debrided with a rasp and a shaver gently to remove chondral flaps  The knee was irrigated suctioned closed with 3-0 nylon suture and injected with 60 cc of Marcaine with epinephrine  Sterile dressing was applied along with a Cryo/Cuff  Patient extubated taken recovery room in stable condition

## 2019-07-23 NOTE — Transfer of Care (Signed)
Immediate Anesthesia Transfer of Care Note  Patient: Karen Mercer  Procedure(s) Performed: ARTHROSCOPY RIGHT KNEE (Right Knee) CHONDROPLASTY RIGHT PATELLA  AND LATERAL FEMUR (Right Knee)  Patient Location: PACU  Anesthesia Type:General  Level of Consciousness: awake, alert  and oriented  Airway & Oxygen Therapy: Patient Spontanous Breathing and Patient connected to face mask oxygen  Post-op Assessment: Report given to RN and Post -op Vital signs reviewed and stable  Post vital signs: Reviewed and stable  Last Vitals:  Vitals Value Taken Time  BP 180/93   Temp    Pulse 63   Resp 9   SpO2 100%     Last Pain:  Vitals:   07/23/19 1127  TempSrc: Oral  PainSc: 5       Patients Stated Pain Goal: 6 (93/79/02 4097)  Complications: No apparent anesthesia complications

## 2019-07-23 NOTE — Brief Op Note (Signed)
07/23/2019  1:42 PM  PATIENT:  Karen Mercer  49 y.o. female  PRE-OPERATIVE DIAGNOSIS:  chondromalacia right patella  POST-OPERATIVE DIAGNOSIS:  chondromalacia right patella  PROCEDURE:  Procedure(s): ARTHROSCOPY RIGHT KNEE (Right) CHONDROPLASTY RIGHT PATELLA  AND LATERAL FEMUR (Right) - [id number]  Surgical findings: The patient had 20 degrees of hyperextension in both knees under anesthesia.  ACL PCL posterior cruciate ligament collateral ligaments stable under anesthesia.  Intraoperatively we found chondromalacia of the lateral facet of the patella medial facet of the patella central trochlea and lateral femoral condyle and medial femoral condyle the following grades respectively, lateral facet to, medial facet 3, central trochlea 3, lateral femoral condyle 3, diffuse grade 2  The patient has significant subluxation of the patella which corrects with knee flexion  SURGEON:  Surgeon(s) and Role:    Carole Civil, MD - Primary  PHYSICIAN ASSISTANT:   ASSISTANTS: none   ANESTHESIA:   general  EBL:  25 mL   BLOOD ADMINISTERED:none  DRAINS: none   LOCAL MEDICATIONS USED:  NONE  SPECIMEN:  No Specimen  DISPOSITION OF SPECIMEN:  N/A  COUNTS:  YES  TOURNIQUET:  * Missing tourniquet times found for documented tourniquets in log: [id number] *  DICTATION: .Viviann Spare Dictation  PLAN OF CARE: Discharge to home after PACU  PATIENT DISPOSITION:  PACU - hemodynamically stable.   Delay start of Pharmacological VTE agent (>24hrs) due to surgical blood loss or risk of bleeding: not applicable  The patient was seen in the preop area and identified as Karen Mercer.  She surgical site was marked as right knee  Patient was taken to the operating room given appropriate Ancef IV placed in supine position where general anesthesia was administered  I did an exam under anesthesia I listed the findings above  After sterile prep and drape and timeout I made a lateral portal and placed  the scope into the joint.  I immediately encountered a chondral lesion on the lateral facet of the patella as well as the medial facet of the patella.  A large amount of subluxation was noted but this corrected with knee flexion.  Through a medial portal I performed a chondroplasty with a Paragon wand and 90 degree wand debriding tissue as needed and using the wand for coagulation as well.  I then evaluated the ACL and PCL which were intact medial lateral menisci were intact.  There was diffuse chondral lesion of the medial femoral condyle as stated above  Lateral femoral condyle had 2 distinct lesions the more proximal lesion was a stellate pattern of chondral injury and then the more distal lesion on the lateral femoral cartilage was a more diffuse lesion.  These were debrided with a rasp and a shaver gently to remove chondral flaps  The knee was irrigated suctioned closed with 3-0 nylon suture and injected with 60 cc of Marcaine with epinephrine  Sterile dressing was applied along with a Cryo/Cuff  Patient extubated taken recovery room in stable condition

## 2019-07-30 DIAGNOSIS — Z9889 Other specified postprocedural states: Secondary | ICD-10-CM | POA: Insufficient documentation

## 2019-07-31 ENCOUNTER — Other Ambulatory Visit: Payer: Self-pay

## 2019-07-31 ENCOUNTER — Ambulatory Visit (INDEPENDENT_AMBULATORY_CARE_PROVIDER_SITE_OTHER): Payer: Medicare HMO | Admitting: Orthopedic Surgery

## 2019-07-31 ENCOUNTER — Encounter: Payer: Self-pay | Admitting: Orthopedic Surgery

## 2019-07-31 DIAGNOSIS — Z9889 Other specified postprocedural states: Secondary | ICD-10-CM

## 2019-07-31 MED ORDER — OXYCODONE-ACETAMINOPHEN 5-325 MG PO TABS: 1.0000 | ORAL_TABLET | ORAL | 0 refills | Status: DC | PRN

## 2019-07-31 MED ORDER — IBUPROFEN 800 MG PO TABS: 800.0000 mg | ORAL_TABLET | Freq: Three times a day (TID) | ORAL | 0 refills | Status: DC | PRN

## 2019-07-31 NOTE — Progress Notes (Signed)
Chief Complaint  Patient presents with  . Routine Post Op    07/23/19 right knee    07/23/2019 PROCEDURE:  Procedure(s): ARTHROSCOPY RIGHT KNEE (Right) CHONDROPLASTY RIGHT PATELLA  AND LATERAL FEMUR (Right) - [id number]   Surgical findings: The patient had 20 degrees of hyperextension in both knees under anesthesia.  ACL PCL posterior cruciate ligament collateral ligaments stable under anesthesia.   Intraoperatively we found chondromalacia of the lateral facet of the patella medial facet of the patella central trochlea and lateral femoral condyle and medial femoral condyle the following grades respectively, lateral facet to, medial facet 3, central trochlea 3, lateral femoral condyle 3, diffuse grade 2   The patient has significant subluxation of the patella which corrects with knee flexion   Patient complains of pain said he got bad Friday night seems to be well with Percocet and ibuprofen  She does have a large joint effusion wounds look good no signs of infection  Refill medication come back in 2 weeks check range of motion possible aspiration if needed.  Work on Dietitian and range of motion  Meds ordered this encounter  Medications  . ibuprofen (ADVIL) 800 MG tablet    Sig: Take 1 tablet (800 mg total) by mouth every 8 (eight) hours as needed.    Dispense:  30 tablet    Refill:  0  . oxyCODONE-acetaminophen (PERCOCET/ROXICET) 5-325 MG tablet    Sig: Take 1 tablet by mouth every 4 (four) hours as needed for up to 7 days for severe pain.    Dispense:  28 tablet    Refill:  0    Encounter Diagnosis  Name Primary?  . S/P right knee arthroscopy for chondroplasty patella 07/23/19  Yes

## 2019-08-12 ENCOUNTER — Other Ambulatory Visit: Payer: Self-pay | Admitting: Orthopedic Surgery

## 2019-08-12 ENCOUNTER — Other Ambulatory Visit: Payer: Self-pay

## 2019-08-12 NOTE — Telephone Encounter (Signed)
Oxycodone-Acetaminophen 5/325 mg  Qty 28 Tablets  Take 1 tablet by mouth every 6 (six) hours as needed for up to 7 days for severe pain.  PATIENT USES [facility]

## 2019-08-14 ENCOUNTER — Encounter: Payer: Self-pay | Admitting: Orthopedic Surgery

## 2019-08-14 ENCOUNTER — Ambulatory Visit: Payer: Medicare HMO | Admitting: Orthopedic Surgery

## 2019-08-19 ENCOUNTER — Other Ambulatory Visit (HOSPITAL_COMMUNITY): Payer: Self-pay | Admitting: *Deleted

## 2019-08-19 ENCOUNTER — Telehealth: Payer: Self-pay | Admitting: Orthopedic Surgery

## 2019-08-19 ENCOUNTER — Other Ambulatory Visit: Payer: Self-pay

## 2019-08-19 ENCOUNTER — Encounter: Payer: Self-pay | Admitting: Orthopedic Surgery

## 2019-08-19 ENCOUNTER — Ambulatory Visit (INDEPENDENT_AMBULATORY_CARE_PROVIDER_SITE_OTHER): Payer: Medicare HMO | Admitting: Orthopedic Surgery

## 2019-08-19 DIAGNOSIS — Z9889 Other specified postprocedural states: Secondary | ICD-10-CM

## 2019-08-19 DIAGNOSIS — D509 Iron deficiency anemia, unspecified: Secondary | ICD-10-CM

## 2019-08-19 DIAGNOSIS — E559 Vitamin D deficiency, unspecified: Secondary | ICD-10-CM

## 2019-08-19 MED ORDER — HYDROCODONE-ACETAMINOPHEN 10-325 MG PO TABS: 1.0000 | ORAL_TABLET | ORAL | 0 refills | Status: AC | PRN

## 2019-08-19 NOTE — Telephone Encounter (Signed)
Patient called back over weekend via voice message regarding missed appointment Friday, 08/14/19. Relayed it was due to a family emergency (she was very upset at time of message). She also called back 08/18/19 to request to reschedule, then had to hang up quickly. Patient called back today, 08/19/19, and has been scheduled for same day appointment; aware of appointment time.

## 2019-08-19 NOTE — Patient Instructions (Signed)
Ice continue until all swelling gone   Continue the exercises 4 more weeks

## 2019-08-19 NOTE — Progress Notes (Signed)
Chief Complaint  Patient presents with  . Routine Post Op    07/23/19 right knee chondroplasty patella   . Medication Refill    oxycodone    49 years old status post multiple chondroplasties right knee on 318 she has a history of patellar subluxation and proximal and distal patellar realignment as well.  She is 4 weeks out from surgery she is making some progress she is regaining 125 degrees of knee flexion she still has a small joint effusion  She would like a refill on her pain medication.  I recommend we taper the oxycodone starting with hydrocodone 10 then 7.5 then 5 then off of all opioids in 4 weeks when she comes back  Follow-up in 4 weeks continue home exercises and ice to control swelling continue modified activity  Meds ordered this encounter  Medications  . HYDROcodone-acetaminophen (NORCO) 10-325 MG tablet    Sig: Take 1 tablet by mouth every 4 (four) hours as needed for up to 7 days.    Dispense:  28 tablet    Refill:  0

## 2019-08-20 ENCOUNTER — Other Ambulatory Visit (HOSPITAL_COMMUNITY): Payer: Self-pay | Admitting: Nurse Practitioner

## 2019-08-20 ENCOUNTER — Inpatient Hospital Stay (HOSPITAL_COMMUNITY): Payer: Medicare HMO | Attending: Hematology

## 2019-08-20 ENCOUNTER — Ambulatory Visit (HOSPITAL_COMMUNITY): Payer: Medicaid Other | Admitting: Nurse Practitioner

## 2019-08-20 DIAGNOSIS — D509 Iron deficiency anemia, unspecified: Secondary | ICD-10-CM

## 2019-08-20 NOTE — Assessment & Plan Note (Deleted)
1. Microcytic anemia: -Patient reports that she has excessive menstrual bleeding lasting up to 7 days with lots of clots. -She denies any bleeding per rectum or melena. -She is started on iron tablets daily and is tolerating them well. -She does have ice pica and is eating 1 bag of ice per day. -CT of the abdomen several years ago showed normal spleen and liver. -Her iron tablets were increased to 2-3 times a day if she is able to tolerate. -Labs done on -If there is a further drop in her hemoglobin we will consider IV iron therapy.

## 2019-08-20 NOTE — Progress Notes (Deleted)
[place] [employer], [place] [address]   CLINIC:  Medical Oncology/Hematology  PCP:  Jani Gravel, [place] [id number] [phone]   REASON FOR VISIT:  Follow-up for ***  CURRENT THERAPY: ***  BRIEF ONCOLOGIC HISTORY:  Oncology History   No history exists.     CANCER STAGING: Cancer Staging No matching staging information was found for the patient.   INTERVAL HISTORY:  Karen Mercer 49 y.o. female returns for routine follow-up and consideration for next cycle of chemotherapy.   Due for cycle #*** of *** today.   Overall, she tells me she has been feeling pretty well. Energy levels ***; appetite ***.   Overall, she feels ready for next cycle of chemo today.     REVIEW OF SYSTEMS:  Review of Systems - Oncology   PAST MEDICAL/SURGICAL HISTORY:  Past Medical History:  Diagnosis Date  . Chronic back pain    both legs;DDD and stenosis   . Complication of anesthesia    had extreme headache after last back surgery - was in pacu for extended period  . Muscle spasm    takes Valium daily as needed  . Neuromuscular disorder ([place])   . PONV (postoperative nausea and vomiting)   . Rheumatoid arthritis [facility])    Past Surgical History:  Procedure Laterality Date  . BACK SURGERY     at surgical center  . CHONDROPLASTY Right 07/23/2019   Procedure: CHONDROPLASTY RIGHT PATELLA  AND LATERAL FEMUR;  Surgeon: Carole Civil, MD;  Location: [facility];  Service: Orthopedics;  Laterality: Right;  . KNEE ARTHROSCOPY  08/17/2011   Procedure: ARTHROSCOPY KNEE;  Surgeon: Carole Civil, MD;  Location: [facility];  Service: Orthopedics;  Laterality: Right;  with tibia tubercle transfer and proximal realignment  . KNEE ARTHROSCOPY Right 07/23/2019   Procedure: ARTHROSCOPY RIGHT KNEE;  Surgeon: Carole Civil, MD;  Location: [facility];  Service: Orthopedics;  Laterality: Right;  . KNEE ARTHROSCOPY WITH FULKERSON SLIDE Left  08/29/2012   Procedure: Arthroscopic lateral release with chondroplasty left knee;  Surgeon: Carole Civil, MD;  Location: [facility];  Service: Orthopedics;  Laterality: Left;  . KNEE ARTHROSCOPY WITH MEDIAL PATELLAR FEMORAL LIGAMENT RECONSTRUCTION Left 08/29/2012   Procedure: Open proximal realignment fulkerson;  Surgeon: Carole Civil, MD;  Location: [facility];  Service: Orthopedics;  Laterality: Left;  . KNEE SURGERY  2012   right knee-arthroscopy  . LUMBAR LAMINECTOMY/DECOMPRESSION MICRODISCECTOMY Right 06/11/2014   Procedure: Right L4-5 Lumbar diskectomy;  Surgeon: Floyce Stakes, MD;  Location: [place] [facility];  Service: Neurosurgery;  Laterality: Right;  Right L4-5 Lumbar diskectomy  . right arm  2008   rods from fractured arm  . TUBAL LIGATION  1995     SOCIAL HISTORY:  Social History   Socioeconomic History  . Marital status: Single    Spouse name: Not on file  . Number of children: 4  . Years of education: Not on file  . Highest education level: Not on file  Occupational History  . Occupation: Disabled  Tobacco Use  . Smoking status: Current Every Day Smoker    Packs/day: 1.00    Years: 20.00    Pack years: 20.00    Types: Cigarettes  . Smokeless tobacco: Never Used  Substance and Sexual Activity  . Alcohol use: Yes    Comment: OCCASIONALLY   . Drug use: No  . Sexual activity: Yes    Birth control/protection:  Surgical    Comment: tubal  Other Topics Concern  . Not on file  Social History Narrative  . Not on file   Social Determinants of Health   Financial Resource Strain:   . Difficulty of Paying Living Expenses:   Food Insecurity:   . Worried About Charity fundraiser in the Last Year:   . Arboriculturist in the Last Year:   Transportation Needs:   . Film/video editor (Medical):   Marland Kitchen Lack of Transportation (Non-Medical):   Physical Activity:   . Days of Exercise per Week:   . Minutes of Exercise per Session:   Stress:   . Feeling of Stress :     Social Connections:   . Frequency of Communication with Friends and Family:   . Frequency of Social Gatherings with Friends and Family:   . Attends Religious Services:   . Active Member of Clubs or Organizations:   . Attends Archivist Meetings:   Marland Kitchen Marital Status:   Intimate Partner Violence:   . Fear of Current or Ex-Partner:   . Emotionally Abused:   Marland Kitchen Physically Abused:   . Sexually Abused:     FAMILY HISTORY:  Family History  Problem Relation Age of Onset  . Arthritis Other        family history   . Hypertension Mother   . Seizures Son   . Healthy Son   . Healthy Son   . Healthy Daughter   . Healthy Sister   . Hypertension Brother   . Hypertension Maternal Grandmother   . Hypertension Paternal Grandmother   . Healthy Sister   . Healthy Sister   . Healthy Sister     CURRENT MEDICATIONS:  Outpatient Encounter Medications as of 08/20/2019  Medication Sig  . HYDROcodone-acetaminophen (NORCO) 10-325 MG tablet Take 1 tablet by mouth every 4 (four) hours as needed for up to 7 days.  Marland Kitchen ibuprofen (ADVIL) 800 MG tablet Take 1 tablet (800 mg total) by mouth every 8 (eight) hours as needed.  Marland Kitchen oxyCODONE-acetaminophen (PERCOCET/ROXICET) 5-325 MG tablet TAKE (1) TABLET BY MOUTH EVERY FOUR HOURS AS NEEDED.   No facility-administered encounter medications on file as of 08/20/2019.    ALLERGIES:  Allergies  Allergen Reactions  . Tramadol Itching and Nausea Only     PHYSICAL EXAM:  ECOG Performance status: ***  There were no vitals filed for this visit. There were no vitals filed for this visit.  Physical Exam   LABORATORY DATA:  I have reviewed the labs as listed.  CBC    Component Value Date/Time   WBC 6.9 07/22/2019 1034   RBC 4.04 07/22/2019 1034   HGB 9.9 (L) 07/22/2019 1034   HCT 32.3 (L) 07/22/2019 1034   PLT 326 07/22/2019 1034   MCV 80.0 07/22/2019 1034   MCH 24.5 (L) 07/22/2019 1034   MCHC 30.7 07/22/2019 1034   RDW 25.1 (H) 07/22/2019  1034   LYMPHSABS 1.8 07/22/2019 1034   MONOABS 0.8 07/22/2019 1034   EOSABS 0.3 07/22/2019 1034   BASOSABS 0.1 07/22/2019 1034   CMP Latest Ref Rng & Units 07/22/2019 07/09/2019 06/09/2019  Glucose 70 - 99 mg/dL 86 94 71  BUN 6 - 20 mg/dL 11 9 9   Creatinine 0.44 - 1.00 mg/dL 0.62 0.57 0.58  Sodium 135 - 145 mmol/L 139 140 137  Potassium 3.5 - 5.1 mmol/L 3.9 3.9 4.0  Chloride 98 - 111 mmol/L 107 107 102  CO2 22 - 32  mmol/L 23 25 24   Calcium 8.9 - 10.3 mg/dL 9.5 9.3 9.0  Total Protein 6.5 - 8.1 g/dL - 7.3 -  Total Bilirubin 0.3 - 1.2 mg/dL - 0.5 -  Alkaline Phos 38 - 126 U/L - 47 -  AST 15 - 41 U/L - 41 -  ALT 0 - 44 U/L - 43 -       DIAGNOSTIC IMAGING:  I have independently reviewed the scans and discussed with the patient.   I have reviewed Francene Finders, NP's note and agree with the documentation.  I personally performed a face-to-face visit, made revisions and my assessment and plan is as follows.    ASSESSMENT & PLAN:   Microcytic anemia 1. Microcytic anemia: -Patient reports that she has excessive menstrual bleeding lasting up to 7 days with lots of clots. -She denies any bleeding per rectum or melena. -She is started on iron tablets daily and is tolerating them well. -She does have ice pica and is eating 1 bag of ice per day. -CT of the abdomen several years ago showed normal spleen and liver. -Her iron tablets were increased to 2-3 times a day if she is able to tolerate. -Labs done on -If there is a further drop in her hemoglobin we will consider IV iron therapy.      Orders placed this encounter:  No orders of the defined types were placed in this encounter.     Derek Jack, MD [place] [phone]

## 2019-08-26 ENCOUNTER — Other Ambulatory Visit: Payer: Self-pay | Admitting: Orthopedic Surgery

## 2019-08-26 DIAGNOSIS — Z9889 Other specified postprocedural states: Secondary | ICD-10-CM

## 2019-08-26 MED ORDER — HYDROCODONE-ACETAMINOPHEN 7.5-325 MG PO TABS: 1.0000 | ORAL_TABLET | Freq: Four times a day (QID) | ORAL | 0 refills | Status: AC | PRN

## 2019-08-26 NOTE — Telephone Encounter (Signed)
Patient requests refill on Hydrocodone/Acetaminophen 10-325  Mgs.  Qty  28  Sig: Take 1 tablet by mouth every 4 (four) hours as needed for up to 7 days.       Patient states she uses [facility]

## 2019-08-26 NOTE — Progress Notes (Signed)
Meds ordered this encounter  Medications  . HYDROcodone-acetaminophen (NORCO) 7.5-325 MG tablet    Sig: Take 1 tablet by mouth every 6 (six) hours as needed for up to 7 days for moderate pain.    Dispense:  28 tablet    Refill:  0

## 2019-09-03 ENCOUNTER — Other Ambulatory Visit: Payer: Self-pay | Admitting: Orthopedic Surgery

## 2019-09-03 MED ORDER — HYDROCODONE-ACETAMINOPHEN 5-325 MG PO TABS: 1.0000 | ORAL_TABLET | Freq: Four times a day (QID) | ORAL | 0 refills | Status: AC | PRN

## 2019-09-03 NOTE — Telephone Encounter (Signed)
Patient called for refill: HYDROcodone-acetaminophen (NORCO) 7.5-325 MG tablet 28 tablet  -[facility]

## 2019-09-03 NOTE — Progress Notes (Signed)
Meds ordered this encounter  Medications  . HYDROcodone-acetaminophen (NORCO/VICODIN) 5-325 MG tablet    Sig: Take 1 tablet by mouth every 6 (six) hours as needed for up to 5 days for moderate pain.    Dispense:  20 tablet    Refill:  0

## 2019-09-09 ENCOUNTER — Other Ambulatory Visit: Payer: Self-pay | Admitting: Orthopedic Surgery

## 2019-09-09 MED ORDER — HYDROCODONE-ACETAMINOPHEN 5-325 MG PO TABS: 1.0000 | ORAL_TABLET | Freq: Four times a day (QID) | ORAL | 0 refills | Status: DC | PRN

## 2019-09-09 NOTE — Telephone Encounter (Signed)
Patient requests refill on Hydrocodone/Acetaminophen 5-325  Mgs.  Qty  20  Sig: Take 1 tablet by mouth every 6 (six) hours as needed for up to 5 days for moderate pain.  Patient uses [facility]

## 2019-09-15 ENCOUNTER — Other Ambulatory Visit: Payer: Self-pay | Admitting: Orthopedic Surgery

## 2019-09-15 MED ORDER — HYDROCODONE-ACETAMINOPHEN 5-325 MG PO TABS: 1.0000 | ORAL_TABLET | Freq: Four times a day (QID) | ORAL | 0 refills | Status: DC | PRN

## 2019-09-15 NOTE — Telephone Encounter (Signed)
Patient called for refill:  HYDROcodone-acetaminophen (NORCO/VICODIN) 5-325 MG tablet 20 tablet  -[facility]

## 2019-09-16 ENCOUNTER — Ambulatory Visit: Payer: Medicare HMO | Admitting: Orthopedic Surgery

## 2019-09-17 ENCOUNTER — Ambulatory Visit: Payer: Medicare HMO | Admitting: Orthopedic Surgery

## 2019-09-17 DIAGNOSIS — Z9889 Other specified postprocedural states: Secondary | ICD-10-CM

## 2019-09-23 ENCOUNTER — Other Ambulatory Visit: Payer: Self-pay

## 2019-09-23 ENCOUNTER — Ambulatory Visit (INDEPENDENT_AMBULATORY_CARE_PROVIDER_SITE_OTHER): Payer: Medicare HMO | Admitting: Orthopedic Surgery

## 2019-09-23 ENCOUNTER — Encounter: Payer: Self-pay | Admitting: Orthopedic Surgery

## 2019-09-23 DIAGNOSIS — M25561 Pain in right knee: Secondary | ICD-10-CM

## 2019-09-23 DIAGNOSIS — Z9889 Other specified postprocedural states: Secondary | ICD-10-CM

## 2019-09-23 MED ORDER — HYDROCODONE-ACETAMINOPHEN 5-325 MG PO TABS: 1.0000 | ORAL_TABLET | Freq: Four times a day (QID) | ORAL | 0 refills | Status: DC | PRN

## 2019-09-23 MED ORDER — IBUPROFEN 800 MG PO TABS: 800.0000 mg | ORAL_TABLET | Freq: Three times a day (TID) | ORAL | 0 refills | Status: AC | PRN

## 2019-09-23 NOTE — Patient Instructions (Addendum)
You have received an injection of steroids into the joint. 15% of patients will have increased pain within the 24 hours postinjection.   This is transient and will go away.   We recommend that you use ice packs on the injection site for 20 minutes every 2 hours and extra strength Tylenol 2 tablets every 8 as needed until the pain resolves.  If you continue to have pain after taking the Tylenol and using the ice please call the office for further instructions.  Walker all the time

## 2019-09-23 NOTE — Progress Notes (Signed)
Chief Complaint  Patient presents with  . Routine Post Op    07/23/19 right knee arthroscopy chondroplasty patella     Encounter Diagnosis  Name Primary?  . S/P right knee arthroscopy for chondroplasty patella 07/23/19  Yes     PROCEDURE:  Procedure(s): ARTHROSCOPY RIGHT KNEE (Right) CHONDROPLASTY RIGHT PATELLA  AND LATERAL FEMUR (Right) - 11216   Surgical findings: The patient had 20 degrees of hyperextension in both knees under anesthesia.  ACL PCL posterior cruciate ligament collateral ligaments stable under anesthesia.   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  She says it hurts really bad she is on hydrocodone 5 mg and Advil  She came in without any walking assistive devices but says she uses her walker and cane  She has tenderness there she is crying in tears  The knee has a small amount of fluid some soft tissue swelling she can bend it about 110 degrees it is fully extends plus hyperextension which is chronic for her  Unclear cause  Possible avascular necrosis  Possible secondary gain  Patient knee was injected medial compartment with Depo-Medrol  Sterile technique was used to inject the medial gutter of the right knee  She is continue with ice medication refills were done follow-up in 4 weeks

## 2019-10-14 ENCOUNTER — Ambulatory Visit (INDEPENDENT_AMBULATORY_CARE_PROVIDER_SITE_OTHER): Payer: Medicare HMO | Admitting: Orthopedic Surgery

## 2019-10-14 ENCOUNTER — Other Ambulatory Visit: Payer: Self-pay

## 2019-10-14 ENCOUNTER — Encounter: Payer: Self-pay | Admitting: Orthopedic Surgery

## 2019-10-14 DIAGNOSIS — M25461 Effusion, right knee: Secondary | ICD-10-CM

## 2019-10-14 DIAGNOSIS — Z9889 Other specified postprocedural states: Secondary | ICD-10-CM

## 2019-10-14 MED ORDER — HYDROCODONE-ACETAMINOPHEN 5-325 MG PO TABS: 1.0000 | ORAL_TABLET | Freq: Four times a day (QID) | ORAL | 0 refills | Status: DC | PRN

## 2019-10-14 NOTE — Progress Notes (Signed)
Chief Complaint  Patient presents with  . Post-op Problem    swelling right knee s/p chondroplasty patella 07/23/19   Patient has had more swelling in the right knee   Procedure note injection and aspiration right knee joint  Verbal consent was obtained to aspirate and inject the right knee joint   Timeout was completed to confirm the site of aspiration and injection  An 18-gauge needle was used to aspirate the knee joint from a suprapatellar lateral approach.  The medications used were 40 mg of Depo-Medrol and 1% lidocaine 3 cc  Anesthesia was provided by ethyl chloride and the skin was prepped with alcohol.  After cleaning the skin with alcohol an 18-gauge needle was used to aspirate the right knee joint.  We obtained 40  cc of fluid clear yellow   We follow this by injection of 40 mg of Depo-Medrol and 3 cc 1% lidocaine.  There were no complications. A sterile bandage was applied.   Meds ordered this encounter  Medications  . HYDROcodone-acetaminophen (NORCO/VICODIN) 5-325 MG tablet    Sig: Take 1 tablet by mouth every 6 (six) hours as needed for moderate pain.    Dispense:  20 tablet    Refill:  0    Ice 4 x a day   F/u 4 weeks

## 2019-10-22 ENCOUNTER — Other Ambulatory Visit: Payer: Self-pay | Admitting: Orthopedic Surgery

## 2019-10-22 DIAGNOSIS — Z9889 Other specified postprocedural states: Secondary | ICD-10-CM

## 2019-10-22 DIAGNOSIS — M25461 Effusion, right knee: Secondary | ICD-10-CM

## 2019-10-22 MED ORDER — HYDROCODONE-ACETAMINOPHEN 5-325 MG PO TABS: 1.0000 | ORAL_TABLET | Freq: Four times a day (QID) | ORAL | 0 refills | Status: DC | PRN

## 2019-10-22 NOTE — Telephone Encounter (Signed)
Patient called for refill:  HYDROcodone-acetaminophen (NORCO/VICODIN) 5-325 MG tablet 20 tablet  -[facility]

## 2019-10-27 ENCOUNTER — Ambulatory Visit: Payer: Medicare HMO | Admitting: Orthopedic Surgery

## 2019-11-03 ENCOUNTER — Other Ambulatory Visit: Payer: Self-pay | Admitting: Orthopedic Surgery

## 2019-11-03 DIAGNOSIS — M25461 Effusion, right knee: Secondary | ICD-10-CM

## 2019-11-03 DIAGNOSIS — Z9889 Other specified postprocedural states: Secondary | ICD-10-CM

## 2019-11-03 MED ORDER — HYDROCODONE-ACETAMINOPHEN 5-325 MG PO TABS: 1.0000 | ORAL_TABLET | Freq: Four times a day (QID) | ORAL | 0 refills | Status: DC | PRN

## 2019-11-03 NOTE — Telephone Encounter (Signed)
Patient called for refill: HYDROcodone-acetaminophen (NORCO/VICODIN) 5-325 MG tablet 20 tablet  -[facility]

## 2019-11-11 ENCOUNTER — Encounter: Payer: Self-pay | Admitting: Orthopedic Surgery

## 2019-11-11 ENCOUNTER — Ambulatory Visit: Payer: Medicare HMO | Admitting: Orthopedic Surgery

## 2019-11-23 ENCOUNTER — Encounter: Payer: Self-pay | Admitting: Orthopedic Surgery

## 2019-11-23 ENCOUNTER — Other Ambulatory Visit: Payer: Self-pay

## 2019-11-23 ENCOUNTER — Ambulatory Visit (INDEPENDENT_AMBULATORY_CARE_PROVIDER_SITE_OTHER): Payer: Medicare HMO | Admitting: Orthopedic Surgery

## 2019-11-23 VITALS — BP 173/119 | HR 80 | Ht 62.0 in | Wt 130.0 lb

## 2019-11-23 DIAGNOSIS — Z9889 Other specified postprocedural states: Secondary | ICD-10-CM | POA: Diagnosis not present

## 2019-11-23 DIAGNOSIS — M25461 Effusion, right knee: Secondary | ICD-10-CM

## 2019-11-23 DIAGNOSIS — M2241 Chondromalacia patellae, right knee: Secondary | ICD-10-CM

## 2019-11-23 MED ORDER — HYDROCODONE-ACETAMINOPHEN 5-325 MG PO TABS: 1.0000 | ORAL_TABLET | Freq: Four times a day (QID) | ORAL | 0 refills | Status: DC | PRN

## 2019-11-23 NOTE — Progress Notes (Signed)
Progress Note   Patient ID: Karen Mercer, female   DOB: [birth year], 49 y.o.   MRN: [id number]  Body mass index is 23.78 kg/m.  Chief Complaint  Patient presents with  . Knee Pain    right/ has pain and swelling     Encounter Diagnoses  Name Primary?  . Effusion of right knee Yes  . Chondromalacia, patella, right   . S/P right knee arthroscopy for chondroplasty patella 07/23/19      HPI  49 year old female status post Fulkerson procedure 15 years ago presented back with pain and swelling right knee. This was treated with nonoperative measures eventually having arthroscopy of the knee where we found her to have chondromalacia of the patella  She continues to have pain and swelling and giving way episodes right knee despite any ligamentous damage  She does have ligament laxity and a feeling of instability   ROS  Right knee swelling    BP (!) 173/119   Pulse 80   Ht 5' 2"  (1.575 m)   Wt 130 lb (59 kg)   BMI 23.78 kg/m   Physical Exam  Moderate effusion right knee  1 medial scar from the medial reefing 1 scar over the tibial tubercle  Knee is very sensitive  Range of motion she has hyperextension of 10degrees flexion is limited to 95 degrees because of the [place] Encounter Diagnoses  Name Primary?  . Effusion of right knee Yes  . Chondromalacia, patella, right   . S/P right knee arthroscopy for chondroplasty patella 07/23/19      DATA ANALYSED:  IMAGING: Independent interpretation of images: Prior imaging shows that the patella statically looks reduced hardware is intact  Aspiration injection right knee  Procedure note injection and aspiration right knee joint  Verbal consent was obtained to aspirate and inject the right knee joint   Timeout was completed to confirm the site of aspiration and injection  An 18-gauge needle was used to aspirate the knee joint from a suprapatellar lateral approach.  The medications used  were 40 mg of Depo-Medrol and 1% lidocaine 3 cc  Anesthesia was provided by ethyl chloride and the skin was prepped with alcohol.  After cleaning the skin with alcohol an 18-gauge needle was used to aspirate the right knee joint.  We obtained 32 cc of fluid clear yellow  We follow this by injection of 40 mg of Depo-Medrol and 3 cc 1% lidocaine.  There were no complications. A sterile bandage was applied.   Orders: Consult at [facility] for possible patella replacement versus medial patellofemoral ligament reconstruction  Outside records reviewed:   no  C. MANAGEMENT   Aspiration injection medication refill  Meds ordered this encounter  Medications  . HYDROcodone-acetaminophen (NORCO/VICODIN) 5-325 MG tablet    Sig: Take 1 tablet by mouth every 6 (six) hours as needed for moderate pain.    Dispense:  20 tablet    Refill:  0    Arther Abbott, MD 11/23/2019 9:29 AM

## 2019-11-27 ENCOUNTER — Ambulatory Visit: Payer: Medicare HMO | Admitting: Orthopedic Surgery

## 2019-12-02 ENCOUNTER — Other Ambulatory Visit: Payer: Self-pay | Admitting: Orthopedic Surgery

## 2019-12-02 DIAGNOSIS — Z9889 Other specified postprocedural states: Secondary | ICD-10-CM

## 2019-12-02 DIAGNOSIS — M25461 Effusion, right knee: Secondary | ICD-10-CM

## 2019-12-02 NOTE — Telephone Encounter (Signed)
Patient called for refill:  HYDROcodone-acetaminophen (NORCO/VICODIN) 5-325 MG tablet 20 tablet  [facility]

## 2019-12-03 MED ORDER — HYDROCODONE-ACETAMINOPHEN 5-325 MG PO TABS: 1.0000 | ORAL_TABLET | Freq: Four times a day (QID) | ORAL | 0 refills | Status: DC | PRN

## 2019-12-16 ENCOUNTER — Other Ambulatory Visit: Payer: Self-pay | Admitting: Orthopedic Surgery

## 2019-12-16 ENCOUNTER — Telehealth: Payer: Self-pay | Admitting: Radiology

## 2019-12-16 DIAGNOSIS — Z9889 Other specified postprocedural states: Secondary | ICD-10-CM

## 2019-12-16 DIAGNOSIS — M25461 Effusion, right knee: Secondary | ICD-10-CM

## 2019-12-16 NOTE — Telephone Encounter (Signed)
Patient called for refill:  HYDROcodone-acetaminophen (NORCO/VICODIN) 5-325 MG tablet 20 tablet  -[facility]

## 2019-12-16 NOTE — Telephone Encounter (Signed)
I called her to advise Dr Aline Brochure wants her to take a 7 day break from the Hydrocodone/ she voiced understanding states she is having MRI scan today of the knee

## 2019-12-16 NOTE — Telephone Encounter (Signed)
Declined needs 7 days opioid holiday

## 2019-12-17 ENCOUNTER — Telehealth: Payer: Self-pay | Admitting: Orthopedic Surgery

## 2019-12-17 DIAGNOSIS — M25461 Effusion, right knee: Secondary | ICD-10-CM

## 2019-12-17 DIAGNOSIS — Z9889 Other specified postprocedural states: Secondary | ICD-10-CM

## 2019-12-17 NOTE — Telephone Encounter (Signed)
Fluid can be removed but it will come back until she gets surgery

## 2019-12-17 NOTE — Telephone Encounter (Signed)
She is asking if you agree she should proceed with the knee replacement

## 2019-12-17 NOTE — Telephone Encounter (Signed)
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  Fluid is back on the right knee also the doctor at [facility] did not take the fluid off   She wants to know if you agree with her having the knee replacement and if you can take the fluid off her knee

## 2019-12-17 NOTE — Telephone Encounter (Signed)
She said she had MRI and has some questions.  She asks that you give her a call when you have a moment  Thanks

## 2019-12-21 NOTE — Telephone Encounter (Signed)
Yes if the doctor said so, risks of infection stiffness and chronic pain

## 2019-12-22 ENCOUNTER — Other Ambulatory Visit: Payer: Self-pay | Admitting: Orthopedic Surgery

## 2019-12-22 DIAGNOSIS — M25461 Effusion, right knee: Secondary | ICD-10-CM

## 2019-12-22 DIAGNOSIS — Z9889 Other specified postprocedural states: Secondary | ICD-10-CM

## 2019-12-22 MED ORDER — HYDROCODONE-ACETAMINOPHEN 5-325 MG PO TABS: 1.0000 | ORAL_TABLET | Freq: Four times a day (QID) | ORAL | 0 refills | Status: DC | PRN

## 2019-12-22 NOTE — Telephone Encounter (Signed)
She is asking for a refill of the Hydrocodone also, she goes to  The pain management clinic on 01/02/20

## 2019-12-24 ENCOUNTER — Other Ambulatory Visit: Payer: Self-pay

## 2019-12-24 ENCOUNTER — Other Ambulatory Visit: Payer: Medicare HMO

## 2019-12-24 DIAGNOSIS — Z20822 Contact with and (suspected) exposure to covid-19: Secondary | ICD-10-CM

## 2019-12-25 LAB — NOVEL CORONAVIRUS, NAA: SARS-CoV-2, NAA: NOT DETECTED

## 2019-12-25 LAB — SARS-COV-2, NAA 2 DAY TAT

## 2019-12-30 ENCOUNTER — Ambulatory Visit: Payer: Medicare HMO | Admitting: Orthopedic Surgery

## 2020-01-12 ENCOUNTER — Other Ambulatory Visit (HOSPITAL_COMMUNITY): Payer: Self-pay | Admitting: Family Medicine

## 2020-01-12 DIAGNOSIS — Z1231 Encounter for screening mammogram for malignant neoplasm of breast: Secondary | ICD-10-CM

## 2020-01-18 ENCOUNTER — Telehealth: Payer: Self-pay | Admitting: Orthopedic Surgery

## 2020-01-18 NOTE — Telephone Encounter (Signed)
Patient called to relay that her surgery which had been scheduled in [facility] has been cancelled. States her surgeon's office advised that she may want to contact our office in event that she may be able to have her knee aspirated again? Please advise.

## 2020-01-20 ENCOUNTER — Ambulatory Visit (HOSPITAL_COMMUNITY): Payer: Medicare HMO

## 2020-01-25 NOTE — Telephone Encounter (Signed)
Called back to patient; offered next available.

## 2020-01-25 NOTE — Telephone Encounter (Signed)
He can aspirate it but fluid will return quickly

## 2020-01-25 NOTE — Telephone Encounter (Signed)
Patient called back, asking about status of note routed on 01/18/20, as to whether Dr Aline Brochure can see her in the office for her same knee due to her other surgery in [facility] being cancelled. Also would like to speak with Amy.

## 2020-02-02 ENCOUNTER — Telehealth: Payer: Self-pay | Admitting: Orthopedic Surgery

## 2020-02-02 NOTE — Telephone Encounter (Signed)
Patient called; states her surgery in [facility] is now rescheduled; therefore, she will not need the October appointment with Dr Aline Brochure. Wanted to let our office know.

## 2020-02-10 ENCOUNTER — Encounter: Payer: Self-pay | Admitting: Emergency Medicine

## 2020-02-10 ENCOUNTER — Other Ambulatory Visit: Payer: Self-pay

## 2020-02-10 ENCOUNTER — Ambulatory Visit
Admission: EM | Admit: 2020-02-10 | Discharge: 2020-02-10 | Disposition: A | Discharge status: Home / Self Care | Payer: Medicare HMO | Attending: Emergency Medicine | Admitting: Emergency Medicine

## 2020-02-10 DIAGNOSIS — R030 Elevated blood-pressure reading, without diagnosis of hypertension: Secondary | ICD-10-CM

## 2020-02-10 MED ORDER — AMLODIPINE BESYLATE 5 MG PO TABS
5.0000 mg | ORAL_TABLET | Freq: Every day | ORAL | 0 refills | Status: DC
Start: 2020-02-10 — End: 2021-01-24

## 2020-02-10 NOTE — Discharge Instructions (Signed)
Amlodipine prescribed.  Take as directed and to completion Please continue to monitor blood pressure at home and keep a log Eat a well balanced diet of fruits, vegetables and lean meats.  Avoid foods high in fat and salt Drink water.  At least half your body weight in ounces Exercise for at least 30 minutes daily Follow up with PCP for further evaluation and management for elevated blood pressure Return or go to the ED if you have any new or worsening symptoms such as vision changes, fatigue, dizziness, chest pain, shortness of breath, nausea, swelling in your hands or feet, urinary symptoms, etc..Marland Kitchen

## 2020-02-10 NOTE — ED Triage Notes (Signed)
Pt has mild headache. bp was 118/103 at home today

## 2020-02-10 NOTE — ED Provider Notes (Signed)
[place]   [id number] 02/10/20 Arrival Time: 0786  CC: Elevated blood pressure  SUBJECTIVE:  Karen Mercer is a 49 y.o. female who presents with elevated blood pressure.  Denies hx of HTN.  Blood pressure this morning 118/103.  163/105 in office.  Currently does not take blood pressure.  Was instructed by PCP to come here for evaluation of elevated blood pressure and to be started on blood pressure medication.  Reports HA.  Denies vision changes, dizziness, lightheadedness, chest pain, shortness of breath, numbness or tingling in extremities, abdominal pain, changes in bowel or bladder habits.    ROS: As per HPI.  All other pertinent ROS negative.     Past Medical History:  Diagnosis Date  . Chronic back pain    both legs;DDD and stenosis   . Complication of anesthesia    had extreme headache after last back surgery - was in pacu for extended period  . Muscle spasm    takes Valium daily as needed  . Neuromuscular disorder ([place])   . PONV (postoperative nausea and vomiting)   . Rheumatoid arthritis [facility])    Past Surgical History:  Procedure Laterality Date  . BACK SURGERY     at surgical center  . CHONDROPLASTY Right 07/23/2019   Procedure: CHONDROPLASTY RIGHT PATELLA  AND LATERAL FEMUR;  Surgeon: Carole Civil, MD;  Location: [facility];  Service: Orthopedics;  Laterality: Right;  . KNEE ARTHROSCOPY  08/17/2011   Procedure: ARTHROSCOPY KNEE;  Surgeon: Carole Civil, MD;  Location: [facility];  Service: Orthopedics;  Laterality: Right;  with tibia tubercle transfer and proximal realignment  . KNEE ARTHROSCOPY Right 07/23/2019   Procedure: ARTHROSCOPY RIGHT KNEE;  Surgeon: Carole Civil, MD;  Location: [facility];  Service: Orthopedics;  Laterality: Right;  . KNEE ARTHROSCOPY WITH FULKERSON SLIDE Left 08/29/2012   Procedure: Arthroscopic lateral release with chondroplasty left knee;  Surgeon: Carole Civil, MD;  Location: [facility];  Service: Orthopedics;   Laterality: Left;  . KNEE ARTHROSCOPY WITH MEDIAL PATELLAR FEMORAL LIGAMENT RECONSTRUCTION Left 08/29/2012   Procedure: Open proximal realignment fulkerson;  Surgeon: Carole Civil, MD;  Location: [facility];  Service: Orthopedics;  Laterality: Left;  . KNEE SURGERY  2012   right knee-arthroscopy  . LUMBAR LAMINECTOMY/DECOMPRESSION MICRODISCECTOMY Right 06/11/2014   Procedure: Right L4-5 Lumbar diskectomy;  Surgeon: Floyce Stakes, MD;  Location: [facility];  Service: Neurosurgery;  Laterality: Right;  Right L4-5 Lumbar diskectomy  . right arm  2008   rods from fractured arm  . TUBAL LIGATION  1995   Allergies  Allergen Reactions  . Tramadol Itching and Nausea Only   No current facility-administered medications on file prior to encounter.   Current Outpatient Medications on File Prior to Encounter  Medication Sig Dispense Refill  . ibuprofen (ADVIL) 800 MG tablet Take 1 tablet (800 mg total) by mouth every 8 (eight) hours as needed. 30 tablet 0  . Vitamin D, Ergocalciferol, (DRISDOL) 1.25 MG (50000 UNIT) CAPS capsule Take 50,000 Units by mouth once a week.     Social History   Socioeconomic History  . Marital status: Single    Spouse name: Not on file  . Number of children: 4  . Years of education: Not on file  . Highest education level: Not on file  Occupational History  . Occupation: Disabled  Tobacco Use  . Smoking status: Current Every Day Smoker    Packs/day: 1.00    Years: 20.00  Pack years: 20.00    Types: Cigarettes  . Smokeless tobacco: Never Used  Vaping Use  . Vaping Use: Never used  Substance and Sexual Activity  . Alcohol use: Yes    Comment: OCCASIONALLY   . Drug use: No  . Sexual activity: Yes    Birth control/protection: Surgical    Comment: tubal  Other Topics Concern  . Not on file  Social History Narrative  . Not on file   Social Determinants of Health   Financial Resource Strain:   . Difficulty of Paying Living Expenses: Not on file    Food Insecurity:   . Worried About Charity fundraiser in the Last Year: Not on file  . Ran Out of Food in the Last Year: Not on file  Transportation Needs:   . Lack of Transportation (Medical): Not on file  . Lack of Transportation (Non-Medical): Not on file  Physical Activity:   . Days of Exercise per Week: Not on file  . Minutes of Exercise per Session: Not on file  Stress:   . Feeling of Stress : Not on file  Social Connections:   . Frequency of Communication with Friends and Family: Not on file  . Frequency of Social Gatherings with Friends and Family: Not on file  . Attends Religious Services: Not on file  . Active Member of Clubs or Organizations: Not on file  . Attends Archivist Meetings: Not on file  . Marital Status: Not on file  Intimate Partner Violence:   . Fear of Current or Ex-Partner: Not on file  . Emotionally Abused: Not on file  . Physically Abused: Not on file  . Sexually Abused: Not on file   Family History  Problem Relation Age of Onset  . Arthritis Other        family history   . Hypertension Mother   . Seizures Son   . Healthy Son   . Healthy Son   . Healthy Daughter   . Healthy Sister   . Hypertension Brother   . Hypertension Maternal Grandmother   . Hypertension Paternal Grandmother   . Healthy Sister   . Healthy Sister   . Healthy Sister     OBJECTIVE:  Vitals:   02/10/20 1718  BP: (!) 163/105  Pulse: 62  Resp: 17  Temp: 98.1 F (36.7 C)  TempSrc: Oral  SpO2: 99%    General appearance: alert; no distress Eyes: PERRLA; EOMI HENT: normocephalic; atraumatic; oropharynx clear Neck: supple with FROM Lungs: clear to auscultation bilaterally Heart: regular rate and rhythm.  Radial pulses 2+ symmetrical bilaterally Extremities: no edema; symmetrical with no gross deformities Skin: warm and dry Neurologic: CN 2-12 grossly intact; normal gait Psychological: alert and cooperative; normal mood and affect   ASSESSMENT &  PLAN:  1. Elevated blood pressure reading     Meds ordered this encounter  Medications  . amLODipine (NORVASC) 5 MG tablet    Sig: Take 1 tablet (5 mg total) by mouth daily.    Dispense:  30 tablet    Refill:  0    Order Specific Question:   Supervising Provider    Answer:   Raylene Everts [[id number]]   Amlodipine prescribed.  Take as directed and to completion Please continue to monitor blood pressure at home and keep a log Eat a well balanced diet of fruits, vegetables and lean meats.  Avoid foods high in fat and salt Drink water.  At least half your body weight  in ounces Exercise for at least 30 minutes daily Follow up with PCP for further evaluation and management for elevated blood pressure Return or go to the ED if you have any new or worsening symptoms such as vision changes, fatigue, dizziness, chest pain, shortness of breath, nausea, swelling in your hands or feet, urinary symptoms, etc...  Reviewed expectations re: course of current medical issues. Questions answered. Outlined signs and symptoms indicating need for more acute intervention. Patient verbalized understanding. After Visit Summary given.   Lestine Box, PA-C 02/10/20 1724

## 2020-02-11 DIAGNOSIS — I160 Hypertensive urgency: Secondary | ICD-10-CM | POA: Insufficient documentation

## 2020-02-17 ENCOUNTER — Ambulatory Visit: Payer: Medicare HMO | Admitting: Orthopedic Surgery

## 2020-02-19 ENCOUNTER — Other Ambulatory Visit: Payer: Medicare HMO

## 2020-02-20 ENCOUNTER — Other Ambulatory Visit: Payer: Medicare HMO

## 2020-02-23 ENCOUNTER — Other Ambulatory Visit: Payer: Self-pay

## 2020-02-23 ENCOUNTER — Other Ambulatory Visit: Payer: Medicare HMO

## 2020-02-23 DIAGNOSIS — Z20822 Contact with and (suspected) exposure to covid-19: Secondary | ICD-10-CM

## 2020-02-24 LAB — NOVEL CORONAVIRUS, NAA: SARS-CoV-2, NAA: NOT DETECTED

## 2020-02-24 LAB — SARS-COV-2, NAA 2 DAY TAT

## 2020-03-01 ENCOUNTER — Ambulatory Visit (HOSPITAL_COMMUNITY): Payer: Medicare HMO | Admitting: Physical Therapy

## 2020-03-01 ENCOUNTER — Encounter (HOSPITAL_COMMUNITY): Payer: Self-pay

## 2020-03-01 ENCOUNTER — Telehealth (HOSPITAL_COMMUNITY): Payer: Self-pay | Admitting: Physical Therapy

## 2020-03-01 NOTE — Telephone Encounter (Signed)
Nurse called (kim Museum/gallery curator from [employer]) stating pt was d/c today and will get home health for 5 visits. Kim r/s eval for 11/12 and will notifiy patient

## 2020-03-03 ENCOUNTER — Ambulatory Visit (HOSPITAL_COMMUNITY): Payer: Medicare HMO | Admitting: Physical Therapy

## 2020-03-07 ENCOUNTER — Ambulatory Visit (HOSPITAL_COMMUNITY): Payer: Medicare HMO | Admitting: Physical Therapy

## 2020-03-09 ENCOUNTER — Ambulatory Visit (HOSPITAL_COMMUNITY): Payer: Medicare HMO | Admitting: Physical Therapy

## 2020-03-11 ENCOUNTER — Encounter (HOSPITAL_COMMUNITY): Payer: Medicare HMO

## 2020-03-14 ENCOUNTER — Encounter (HOSPITAL_COMMUNITY): Payer: Medicare HMO | Admitting: Physical Therapy

## 2020-03-16 ENCOUNTER — Encounter (HOSPITAL_COMMUNITY): Payer: Medicare HMO

## 2020-03-18 ENCOUNTER — Telehealth (HOSPITAL_COMMUNITY): Payer: Self-pay | Admitting: Physical Therapy

## 2020-03-18 ENCOUNTER — Ambulatory Visit (HOSPITAL_COMMUNITY): Payer: Medicare HMO | Admitting: Physical Therapy

## 2020-03-18 NOTE — Telephone Encounter (Signed)
Pt called re no show.  PT states she is out of town with a family situation and will be here for [facility] appointment.  [name], [place] CLT [phone]

## 2020-03-18 NOTE — Telephone Encounter (Signed)
Pt was called and said she had a family emergency and r/s for 03/21/20 per CR.

## 2020-03-21 ENCOUNTER — Ambulatory Visit (HOSPITAL_COMMUNITY): Payer: Medicare HMO | Attending: Orthopedic Surgery | Admitting: Physical Therapy

## 2020-03-23 ENCOUNTER — Ambulatory Visit (HOSPITAL_COMMUNITY): Payer: Medicare HMO

## 2020-03-25 ENCOUNTER — Ambulatory Visit (HOSPITAL_COMMUNITY): Payer: Medicare HMO

## 2020-03-28 ENCOUNTER — Ambulatory Visit (HOSPITAL_COMMUNITY): Payer: Medicare HMO | Admitting: Physical Therapy

## 2020-03-28 ENCOUNTER — Other Ambulatory Visit: Payer: Self-pay

## 2020-03-30 ENCOUNTER — Ambulatory Visit (HOSPITAL_COMMUNITY): Payer: Medicare HMO | Admitting: Physical Therapy

## 2020-04-04 ENCOUNTER — Ambulatory Visit (HOSPITAL_COMMUNITY): Payer: Medicare HMO | Admitting: Physical Therapy

## 2020-04-06 ENCOUNTER — Ambulatory Visit (HOSPITAL_COMMUNITY): Payer: Medicare HMO

## 2020-04-08 ENCOUNTER — Encounter (HOSPITAL_COMMUNITY): Payer: Medicare HMO

## 2020-04-11 ENCOUNTER — Ambulatory Visit (HOSPITAL_COMMUNITY): Payer: Medicare HMO | Admitting: Physical Therapy

## 2020-04-13 ENCOUNTER — Encounter (HOSPITAL_COMMUNITY): Payer: Medicare HMO | Admitting: Physical Therapy

## 2020-04-15 ENCOUNTER — Encounter (HOSPITAL_COMMUNITY): Payer: Medicare HMO | Admitting: Physical Therapy

## 2020-04-21 ENCOUNTER — Encounter (HOSPITAL_COMMUNITY): Payer: Self-pay | Admitting: Physical Therapy

## 2020-04-21 ENCOUNTER — Other Ambulatory Visit: Payer: Self-pay

## 2020-04-21 ENCOUNTER — Ambulatory Visit (HOSPITAL_COMMUNITY): Payer: Medicare HMO | Attending: Orthopedic Surgery | Admitting: Physical Therapy

## 2020-04-21 DIAGNOSIS — M25661 Stiffness of right knee, not elsewhere classified: Secondary | ICD-10-CM | POA: Insufficient documentation

## 2020-04-21 DIAGNOSIS — R29898 Other symptoms and signs involving the musculoskeletal system: Secondary | ICD-10-CM | POA: Insufficient documentation

## 2020-04-21 DIAGNOSIS — M25561 Pain in right knee: Secondary | ICD-10-CM | POA: Diagnosis not present

## 2020-04-21 DIAGNOSIS — M6281 Muscle weakness (generalized): Secondary | ICD-10-CM | POA: Diagnosis present

## 2020-04-21 DIAGNOSIS — R2689 Other abnormalities of gait and mobility: Secondary | ICD-10-CM | POA: Diagnosis present

## 2020-04-21 NOTE — Therapy (Signed)
[place] [address] [place], [place], [address] Phone: [phone]   Fax:  [phone]  Physical Therapy Evaluation  Patient Details  Name: Karen Mercer MRN: [id number] Date of Birth: 1970-07-30 Referring Provider (PT): Mayer Camel   Encounter Date: 04/21/2020   PT End of Session - 04/21/20 1127    Visit Number 1    Number of Visits 12    Date for PT Re-Evaluation 06/02/20    Authorization Type Primary [employer] Medicare Secondary Medicaid    Authorization Time Period requesting 12 visits 04/21/20-06/02/20 - check auth    Authorization - Visit Number 0    Authorization - Number of Visits 12    Progress Note Due on Visit 10    PT Start Time 1048    PT Stop Time 1125    PT Time Calculation (min) 37 min    Activity Tolerance Patient tolerated treatment well    Behavior During Therapy [facility] for tasks assessed/performed           Past Medical History:  Diagnosis Date   Chronic back pain    both legs;DDD and stenosis    Complication of anesthesia    had extreme headache after last back surgery - was in pacu for extended period   Muscle spasm    takes Valium daily as needed   Neuromuscular disorder ([facility])    PONV (postoperative nausea and vomiting)    Rheumatoid arthritis ([place])     Past Surgical History:  Procedure Laterality Date   BACK SURGERY     at surgical center   CHONDROPLASTY Right 07/23/2019   Procedure: CHONDROPLASTY RIGHT PATELLA  AND LATERAL FEMUR;  Surgeon: Carole Civil, MD;  Location: [facility];  Service: Orthopedics;  Laterality: Right;   KNEE ARTHROSCOPY  08/17/2011   Procedure: ARTHROSCOPY KNEE;  Surgeon: Carole Civil, MD;  Location: [facility];  Service: Orthopedics;  Laterality: Right;  with tibia tubercle transfer and proximal realignment   KNEE ARTHROSCOPY Right 07/23/2019   Procedure: ARTHROSCOPY RIGHT KNEE;  Surgeon: Carole Civil, MD;  Location: [facility];  Service: Orthopedics;   Laterality: Right;   KNEE ARTHROSCOPY WITH FULKERSON SLIDE Left 08/29/2012   Procedure: Arthroscopic lateral release with chondroplasty left knee;  Surgeon: Carole Civil, MD;  Location: [facility];  Service: Orthopedics;  Laterality: Left;   KNEE ARTHROSCOPY WITH MEDIAL PATELLAR FEMORAL LIGAMENT RECONSTRUCTION Left 08/29/2012   Procedure: Open proximal realignment fulkerson;  Surgeon: Carole Civil, MD;  Location: [facility];  Service: Orthopedics;  Laterality: Left;   KNEE SURGERY  2012   right knee-arthroscopy   LUMBAR LAMINECTOMY/DECOMPRESSION MICRODISCECTOMY Right 06/11/2014   Procedure: Right L4-5 Lumbar diskectomy;  Surgeon: Floyce Stakes, MD;  Location: [facility];  Service: Neurosurgery;  Laterality: Right;  Right L4-5 Lumbar diskectomy   right arm  2008   rods from fractured arm   TUBAL LIGATION  1995    There were no vitals filed for this visit.    Subjective Assessment - 04/21/20 1046    Subjective Patient is a 49 y.o. female who presents to physical therapy s/p R TKA on 02/26/20. Patient states her knee is doing better. Her knee gets stiff with sitting. She had home health PT and she is still doing her basic HHPT exercises every day. Patient states difficulty with stairs, walking, getting up from seated. Patient states her main goal is to get back to moving on her own.    Limitations Walking;House  hold activities;Standing    How long can you walk comfortably? 3-4  minutes    Patient Stated Goals to get back to moving on her own    Currently in Pain? No/denies   worst 6/ 10 following transfer to standing             [facility] PT Assessment - 04/21/20 0001      Assessment   Medical Diagnosis R TKA    Referring Provider (PT) Mayer Camel    Onset Date/Surgical Date 02/26/20    Next MD Visit Feb. 2nd    Prior Therapy Yes      Precautions   Precautions None      Restrictions   Weight Bearing Restrictions No      Balance Screen   Has the patient fallen in the  past 6 months No    Has the patient had a decrease in activity level because of a fear of falling?  No    Is the patient reluctant to leave their home because of a fear of falling?  No      Prior Function   Level of Independence Independent      Cognition   Overall Cognitive Status Within Functional Limits for tasks assessed      Observation/Other Assessments   Observations Ambulates without AD, antalgic gait    Focus on Therapeutic Outcomes (FOTO)  66% limited      ROM / Strength   AROM / PROM / Strength AROM;Strength      AROM   AROM Assessment Site Knee    Right/Left Knee Right;Left    Right Knee Extension 10   lacking   Right Knee Flexion 116    Left Knee Extension 9   hyperextension   Left Knee Flexion 146      Strength   Strength Assessment Site Hip;Knee;Ankle    Right/Left Hip Right;Left    Right Hip Flexion 3+/5   pain in quads limited   Left Hip Flexion 4+/5    Right/Left Knee Right;Left    Right Knee Flexion 3+/5    Right Knee Extension 3+/5    Left Knee Flexion 5/5    Left Knee Extension 5/5    Right/Left Ankle Right;Left    Right Ankle Dorsiflexion 4-/5    Left Ankle Dorsiflexion 5/5      Palpation   Patella mobility WFL medial and lateral    Palpation comment TTP R quads, scar tissue around incision      Transfers   Comments labored, extends RLE using mostly LLE      Ambulation/Gait   Ambulation/Gait Yes    Ambulation/Gait Assistance 7: Independent    Ambulation Distance (Feet) 250 Feet    Assistive device None    Gait Pattern Decreased hip/knee flexion - right;Antalgic    Ambulation Surface Level;Indoor    Gait velocity decreased    Stairs Yes    Stairs Assistance 4: Min guard    Stair Management Technique Step to pattern;One rail Right;One rail Left    Number of Stairs 6    Height of Stairs 4    Gait Comments 2MWT                      Objective measurements completed on examination: See above findings.       [facility] Adult PT  Treatment/Exercise - 04/21/20 0001      Exercises   Exercises Knee/Hip      Knee/Hip Exercises: Seated  [employer] 1 set;Right;10 reps    [employer] Limitations 10 second holds      Knee/Hip Exercises: Supine   Quad Sets Right;1 set;10 reps    [employer] Limitations 5-10 second hold    Heel Slides Right;5 reps    Heel Slides Limitations 5 second holds                  PT Education - 04/21/20 1045    Education Details Patient educated on exam findings, POC, scope of PT, exercise mechanics, HEP    Person(s) Educated Patient    Methods Explanation;Demonstration    Comprehension Verbalized understanding;Returned demonstration            PT Short Term Goals - 04/21/20 1136      PT SHORT TERM GOAL #1   Title Patient will be independent with HEP in order to improve functional outcomes.    Time 3    Period Weeks    Status New    Target Date 05/12/20      PT SHORT TERM GOAL #2   Title Patient will report at least 25% improvement in symptoms for improved quality of life.    Time 3    Period Weeks    Status New    Target Date 05/12/20             PT Long Term Goals - 04/21/20 1137      PT LONG TERM GOAL #1   Title Patient will report at least 75% improvement in symptoms for improved quality of life.    Time 6    Period Weeks    Status New    Target Date 06/02/20      PT LONG TERM GOAL #2   Title Patient will improve FOTO score by at least 20 points in order to indicate improved tolerance to activity.    Time 6    Period Weeks    Status New    Target Date 06/02/20      PT LONG TERM GOAL #3   Title Patient will be able to navigate stairs with reciprocal pattern without compensation in order to demonstrate improved LE strength.    Time 6    Period Weeks    Status New    Target Date 06/02/20      PT LONG TERM GOAL #4   Title Patient will be able to ambulate at least 450 feet in 2MWT in order to demonstrate improved gait speed for community  ambulation.    Time 6    Period Weeks    Status New    Target Date 06/02/20                  Plan - 04/21/20 1133    Clinical Impression Statement Patient is a 49 y.o. female who presents to physical therapy s/p R TKA on 02/26/20. She presents with pain limited deficits in R knee strength, ROM, endurance, gait, balance, and functional mobility with ADL. She is having to modify and restrict ADL as indicated by FOTO score as well as subjective information and objective measures which is affecting overall participation. Patient will benefit from skilled physical therapy in order to improve function and reduce impairment.    Personal Factors and Comorbidities Fitness;Behavior Pattern;Past/Current Experience;Comorbidity 3+;Time since onset of injury/illness/exacerbation    Comorbidities : Arthritis, Back pain, Osteoporosis, chronic knee pain    Examination-Activity Limitations Locomotion Level;Transfers;Bend;Stand;Stairs;Squat;Lift;Carry    Examination-Participation Restrictions Church;Meal Prep;Cleaning;Yard Work;Volunteer;Shop  Stability/Clinical Decision Making Stable/Uncomplicated    Clinical Decision Making Low    Rehab Potential Good    PT Frequency 2x / week    PT Duration 6 weeks    PT Treatment/Interventions ADLs/Self Care Home Management;Aquatic Therapy;Cryotherapy;Scientist, product/process development;Iontophoresis 65m/ml Dexamethasone;Moist Heat;Traction;Contrast Bath;Gait training;Stair training;Functional mobility training;Therapeutic activities;Therapeutic exercise;Balance training;Neuromuscular re-education;Patient/family education;Manual techniques;Dry needling;Energy conservation;Splinting;Taping;Passive range of motion;Scar mobilization;Compression bandaging;Orthotic Fit/Training;Spinal Manipulations;Joint Manipulations    PT Next Visit Plan continue R knee mobility and strengthening exercises    PT Home Exercise Plan 12/16 LAQ, quad set, heel slides    Consulted and  Agree with Plan of Care Patient           Patient will benefit from skilled therapeutic intervention in order to improve the following deficits and impairments:  Abnormal gait,Decreased range of motion,Difficulty walking,Pain,Increased muscle spasms,Decreased endurance,Decreased activity tolerance,Decreased balance,Impaired flexibility,Improper body mechanics,Decreased strength,Decreased mobility  Visit Diagnosis: Right knee pain, unspecified chronicity  Muscle weakness (generalized)  Other abnormalities of gait and mobility  Other symptoms and signs involving the musculoskeletal system  Stiffness of right knee, not elsewhere classified     Problem List Patient Active Problem List   Diagnosis Date Noted   S/P right knee arthroscopy for chondroplasty patella 07/23/19  07/30/2019   Microcytic anemia 07/09/2019   Lumbar degenerative disc disease 09/22/2014   Lumbar herniated disc 06/11/2014   Chronic pain 05/14/2013   HNP (herniated nucleus pulposus), lumbar 05/14/2013   Leg weakness, bilateral 04/21/2013   Radicular pain of left lower extremity 03/31/2013   Retrolisthesis 03/31/2013   Effusion of knee joint 10/21/2012   Status post knee surgery 10/21/2012   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   Patellofemoral instability with pain 05/22/2012   Dislocation of patella 01/16/2012   Knee pain, bilateral 01/16/2012   Difficulty in walking 10/31/2011   Pain in right knee 10/31/2011   Weakness of right leg 10/31/2011   Knee pain 09/11/2011   Stiffness of joint, not elsewhere classified, lower leg 09/11/2011   Gait abnormality 09/11/2011   Muscle weakness (generalized) 09/11/2011   Knee instability 07/12/2011   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

## 2020-04-26 ENCOUNTER — Ambulatory Visit (HOSPITAL_COMMUNITY): Payer: Medicare HMO | Admitting: Physical Therapy

## 2020-04-26 ENCOUNTER — Encounter (HOSPITAL_COMMUNITY): Payer: Self-pay | Admitting: Physical Therapy

## 2020-04-26 ENCOUNTER — Other Ambulatory Visit: Payer: Self-pay

## 2020-04-26 DIAGNOSIS — M25661 Stiffness of right knee, not elsewhere classified: Secondary | ICD-10-CM

## 2020-04-26 DIAGNOSIS — R29898 Other symptoms and signs involving the musculoskeletal system: Secondary | ICD-10-CM

## 2020-04-26 DIAGNOSIS — R2689 Other abnormalities of gait and mobility: Secondary | ICD-10-CM

## 2020-04-26 DIAGNOSIS — M6281 Muscle weakness (generalized): Secondary | ICD-10-CM

## 2020-04-26 DIAGNOSIS — M25561 Pain in right knee: Secondary | ICD-10-CM | POA: Diagnosis not present

## 2020-04-26 NOTE — Therapy (Signed)
Karen Mercer, [place], [address] Phone: [phone]   Fax:  [phone]  Physical Therapy Treatment  Patient Details  Name: Karen Mercer MRN: [id number] Date of Birth: 1970-12-30 Referring Provider (PT): Mayer Camel   Encounter Date: 04/26/2020   PT End of Session - 04/26/20 1046    Number of Visits 12    Date for PT Re-Evaluation 06/02/20    Authorization Type Primary [employer] Medicare Secondary Medicaid    Authorization Time Period requesting 12 visits 04/21/20-06/02/20    Authorization - Visit Number 1    Authorization - Number of Visits 12    Progress Note Due on Visit 10    PT Start Time 1696    PT Stop Time 1125    PT Time Calculation (min) 38 min    Activity Tolerance Patient tolerated treatment well    Behavior During Therapy [facility] for tasks assessed/performed           Past Medical History:  Diagnosis Date   Chronic back pain    both legs;DDD and stenosis    Complication of anesthesia    had extreme headache after last back surgery - was in pacu for extended period   Muscle spasm    takes Valium daily as needed   Neuromuscular disorder (HCC)    PONV (postoperative nausea and vomiting)    Rheumatoid arthritis ([place])     Past Surgical History:  Procedure Laterality Date   BACK SURGERY     at surgical center   CHONDROPLASTY Right 07/23/2019   Procedure: CHONDROPLASTY RIGHT PATELLA  AND LATERAL FEMUR;  Surgeon: Carole Civil, MD;  Location: [facility];  Service: Orthopedics;  Laterality: Right;   KNEE ARTHROSCOPY  08/17/2011   Procedure: ARTHROSCOPY KNEE;  Surgeon: Carole Civil, MD;  Location: [facility];  Service: Orthopedics;  Laterality: Right;  with tibia tubercle transfer and proximal realignment   KNEE ARTHROSCOPY Right 07/23/2019   Procedure: ARTHROSCOPY RIGHT KNEE;  Surgeon: Carole Civil, MD;  Location: [facility];  Service: Orthopedics;  Laterality: Right;   KNEE  ARTHROSCOPY WITH FULKERSON SLIDE Left 08/29/2012   Procedure: Arthroscopic lateral release with chondroplasty left knee;  Surgeon: Carole Civil, MD;  Location: [facility];  Service: Orthopedics;  Laterality: Left;   KNEE ARTHROSCOPY WITH MEDIAL PATELLAR FEMORAL LIGAMENT RECONSTRUCTION Left 08/29/2012   Procedure: Open proximal realignment fulkerson;  Surgeon: Carole Civil, MD;  Location: [facility];  Service: Orthopedics;  Laterality: Left;   KNEE SURGERY  2012   right knee-arthroscopy   LUMBAR LAMINECTOMY/DECOMPRESSION MICRODISCECTOMY Right 06/11/2014   Procedure: Right L4-5 Lumbar diskectomy;  Surgeon: Floyce Stakes, MD;  Location: [place] [facility];  Service: Neurosurgery;  Laterality: Right;  Right L4-5 Lumbar diskectomy   right arm  2008   rods from fractured arm   TUBAL LIGATION  1995    There were no vitals filed for this visit.   Subjective Assessment - 04/26/20 1047    Subjective Patient stated she had a rough day yesterday. Her home exercises are going well.    Limitations Walking;House hold activities;Standing    How long can you walk comfortably? 3-4  minutes    Patient Stated Goals to get back to moving on her own    Currently in Pain? No/denies                             [facility] Adult  PT Treatment/Exercise - 04/26/20 0001      Knee/Hip Exercises: Stretches   Gastroc Stretch Both;3 reps;30 seconds      Knee/Hip Exercises: Aerobic   Recumbent Bike 3 minutes for dynamic warm up      Knee/Hip Exercises: Standing   Heel Raises Both;1 set;20 reps    Forward Step Up Right;2 sets;10 reps;Hand Hold: 2;Step Height: 4"    Functional Squat 2 sets;10 reps    Other Standing Knee Exercises lateral stepping 6x 16 feet    Other Standing Knee Exercises tandem stance 2x 30 second holds bilateral      Knee/Hip Exercises: Seated   Sit to Sand 2 sets;10 reps      Knee/Hip Exercises: Supine   Straight Leg Raises Right;2 sets;15 reps    Straight Leg Raises  Limitations with quad set    Knee Extension AROM    Knee Extension Limitations lacking 3    Knee Flexion AROM    Knee Flexion Limitations 122      Knee/Hip Exercises: Sidelying   Hip ABduction Left;1 set;15 reps      Knee/Hip Exercises: Prone   Hip Extension Left;1 set;15 reps                  PT Education - 04/26/20 1045    Education Details Patient educated on HEP, exercise mechanics    Person(s) Educated Patient    Methods Explanation;Demonstration    Comprehension Verbalized understanding;Returned demonstration            PT Short Term Goals - 04/21/20 1136      PT SHORT TERM GOAL #1   Title Patient will be independent with HEP in order to improve functional outcomes.    Time 3    Period Weeks    Status New    Target Date 05/12/20      PT SHORT TERM GOAL #2   Title Patient will report at least 25% improvement in symptoms for improved quality of life.    Time 3    Period Weeks    Status New    Target Date 05/12/20             PT Long Term Goals - 04/21/20 1137      PT LONG TERM GOAL #1   Title Patient will report at least 75% improvement in symptoms for improved quality of life.    Time 6    Period Weeks    Status New    Target Date 06/02/20      PT LONG TERM GOAL #2   Title Patient will improve FOTO score by at least 20 points in order to indicate improved tolerance to activity.    Time 6    Period Weeks    Status New    Target Date 06/02/20      PT LONG TERM GOAL #3   Title Patient will be able to navigate stairs with reciprocal pattern without compensation in order to demonstrate improved LE strength.    Time 6    Period Weeks    Status New    Target Date 06/02/20      PT LONG TERM GOAL #4   Title Patient will be able to ambulate at least 450 feet in 2MWT in order to demonstrate improved gait speed for community ambulation.    Time 6    Period Weeks    Status New    Target Date 06/02/20  Plan - 04/26/20  1046    Clinical Impression Statement Patient completes bike at beginning of session for dynamic warm up. Patient continues to ambulate with limited knee flexion extension ROM with antalgic gait. Patient showing improving ROM today from lacking 3 to 122. Patient completes quad sets without extensor lag. Patient requires cueing to limit excessive trunk movement with table hip exercises. Patient tends to perform squats with excessive anterior translation and mechanics improve with intermittent cueing and demonstration. Patient apprehensive with step up initially with limited knee flexion ROM on eccentric phase. Patient will continue to benefit from skilled physical therapy in order to reduce impairment and improve function.    Personal Factors and Comorbidities Fitness;Behavior Pattern;Past/Current Experience;Comorbidity 3+;Time since onset of injury/illness/exacerbation    Comorbidities : Arthritis, Back pain, Osteoporosis, chronic knee pain    Examination-Activity Limitations Locomotion Level;Transfers;Bend;Stand;Stairs;Squat;Lift;Carry    Examination-Participation Restrictions Church;Meal Prep;Cleaning;Yard Work;Volunteer;Shop    Stability/Clinical Decision Making Stable/Uncomplicated    Rehab Potential Good    PT Frequency 2x / week    PT Duration 6 weeks    PT Treatment/Interventions ADLs/Self Care Home Management;Aquatic Therapy;Cryotherapy;Scientist, product/process development;Iontophoresis 63m/ml Dexamethasone;Moist Heat;Traction;Contrast Bath;Gait training;Stair training;Functional mobility training;Therapeutic activities;Therapeutic exercise;Balance training;Neuromuscular re-education;Patient/family education;Manual techniques;Dry needling;Energy conservation;Splinting;Taping;Passive range of motion;Scar mobilization;Compression bandaging;Orthotic Fit/Training;Spinal Manipulations;Joint Manipulations    PT Next Visit Plan continue R knee mobility and strengthening exercises    PT Home Exercise  Plan 12/16 LAQ, quad set, heel slides 12/21 squat, SLR    Consulted and Agree with Plan of Care Patient           Patient will benefit from skilled therapeutic intervention in order to improve the following deficits and impairments:  Abnormal gait,Decreased range of motion,Difficulty walking,Pain,Increased muscle spasms,Decreased endurance,Decreased activity tolerance,Decreased balance,Impaired flexibility,Improper body mechanics,Decreased strength,Decreased mobility  Visit Diagnosis: Right knee pain, unspecified chronicity  Muscle weakness (generalized)  Other abnormalities of gait and mobility  Other symptoms and signs involving the musculoskeletal system  Stiffness of right knee, not elsewhere classified     Problem List Patient Active Problem List   Diagnosis Date Noted   S/P right knee arthroscopy for chondroplasty patella 07/23/19  07/30/2019   Microcytic anemia 07/09/2019   Lumbar degenerative disc disease 09/22/2014   Lumbar herniated disc 06/11/2014   Chronic pain 05/14/2013   HNP (herniated nucleus pulposus), lumbar 05/14/2013   Leg weakness, bilateral 04/21/2013   Radicular pain of left lower extremity 03/31/2013   Retrolisthesis 03/31/2013   Effusion of knee joint 10/21/2012   Status post knee surgery 10/21/2012   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   Patellofemoral instability with pain 05/22/2012   Dislocation of patella 01/16/2012   Knee pain, bilateral 01/16/2012   Difficulty in walking 10/31/2011   Pain in right knee 10/31/2011   Weakness of right leg 10/31/2011   Knee pain 09/11/2011   Stiffness of joint, not elsewhere classified, lower leg 09/11/2011   Gait abnormality 09/11/2011   Muscle weakness (generalized) 09/11/2011   Knee instability 07/12/2011   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

## 2020-04-28 ENCOUNTER — Telehealth (HOSPITAL_COMMUNITY): Payer: Self-pay

## 2020-04-28 ENCOUNTER — Ambulatory Visit (HOSPITAL_COMMUNITY): Payer: Medicare HMO

## 2020-04-28 NOTE — Telephone Encounter (Signed)
No show, called and spoke to pt who stated an emergency happened at her home today and unable to make it.  Reminded next apt date and time and encouraged pt to call and cancel/reschedule if unable to make it to future apts.  Ihor Austin, LPTA/CLT; Delana Meyer [phone]

## 2020-05-03 ENCOUNTER — Telehealth (HOSPITAL_COMMUNITY): Payer: Self-pay

## 2020-05-03 ENCOUNTER — Ambulatory Visit (HOSPITAL_COMMUNITY): Payer: Medicare HMO

## 2020-05-03 NOTE — Telephone Encounter (Signed)
Called left message on her personal voicemail and reminder of next appointment date/time.  11:26 AM, 05/03/20 M. Sherlyn Lees, PT, DPT Physical Therapist- [place] Office Number: [phone]

## 2020-05-05 ENCOUNTER — Other Ambulatory Visit: Payer: Self-pay

## 2020-05-05 ENCOUNTER — Ambulatory Visit (HOSPITAL_COMMUNITY): Payer: Medicare HMO | Admitting: Physical Therapy

## 2020-05-05 ENCOUNTER — Encounter (HOSPITAL_COMMUNITY): Payer: Self-pay | Admitting: Physical Therapy

## 2020-05-05 DIAGNOSIS — M25661 Stiffness of right knee, not elsewhere classified: Secondary | ICD-10-CM

## 2020-05-05 DIAGNOSIS — R29898 Other symptoms and signs involving the musculoskeletal system: Secondary | ICD-10-CM

## 2020-05-05 DIAGNOSIS — M25561 Pain in right knee: Secondary | ICD-10-CM | POA: Diagnosis not present

## 2020-05-05 DIAGNOSIS — M6281 Muscle weakness (generalized): Secondary | ICD-10-CM

## 2020-05-05 DIAGNOSIS — R2689 Other abnormalities of gait and mobility: Secondary | ICD-10-CM

## 2020-05-05 NOTE — Therapy (Signed)
[facility] [address] [place], [place], [address] Phone: [phone]   Fax:  [phone]  Physical Therapy Treatment  Patient Details  Name: Karen Mercer MRN: [id number] Date of Birth: 1970-11-25 Referring Provider (PT): Mayer Camel  Progress Note Reporting Period 04/11/2020  to 05/05/2020  See note below for Objective Data and Assessment of Progress/Goals.      Encounter Date: 05/05/2020   PT End of Session - 05/05/20 1109    Visit Number 3    Number of Visits 12    Date for PT Re-Evaluation 06/02/20    Authorization Type Primary [employer] Medicare Secondary Medicaid    Authorization Time Period requesting 12 visits 04/21/20-06/02/20    Authorization - Visit Number 1    Authorization - Number of Visits 12    Progress Note Due on Visit 10    Activity Tolerance Patient tolerated treatment well    Behavior During Therapy [facility] for tasks assessed/performed           Past Medical History:  Diagnosis Date   Chronic back pain    both legs;DDD and stenosis    Complication of anesthesia    had extreme headache after last back surgery - was in pacu for extended period   Muscle spasm    takes Valium daily as needed   Neuromuscular disorder (HCC)    PONV (postoperative nausea and vomiting)    Rheumatoid arthritis ([place])     Past Surgical History:  Procedure Laterality Date   BACK SURGERY     at surgical center   CHONDROPLASTY Right 07/23/2019   Procedure: CHONDROPLASTY RIGHT PATELLA  AND LATERAL FEMUR;  Surgeon: Carole Civil, MD;  Location: [facility];  Service: Orthopedics;  Laterality: Right;   KNEE ARTHROSCOPY  08/17/2011   Procedure: ARTHROSCOPY KNEE;  Surgeon: Carole Civil, MD;  Location: [facility];  Service: Orthopedics;  Laterality: Right;  with tibia tubercle transfer and proximal realignment   KNEE ARTHROSCOPY Right 07/23/2019   Procedure: ARTHROSCOPY RIGHT KNEE;  Surgeon: Carole Civil, MD;   Location: [facility];  Service: Orthopedics;  Laterality: Right;   KNEE ARTHROSCOPY WITH FULKERSON SLIDE Left 08/29/2012   Procedure: Arthroscopic lateral release with chondroplasty left knee;  Surgeon: Carole Civil, MD;  Location: [facility];  Service: Orthopedics;  Laterality: Left;   KNEE ARTHROSCOPY WITH MEDIAL PATELLAR FEMORAL LIGAMENT RECONSTRUCTION Left 08/29/2012   Procedure: Open proximal realignment fulkerson;  Surgeon: Carole Civil, MD;  Location: [facility];  Service: Orthopedics;  Laterality: Left;   KNEE SURGERY  2012   right knee-arthroscopy   LUMBAR LAMINECTOMY/DECOMPRESSION MICRODISCECTOMY Right 06/11/2014   Procedure: Right L4-5 Lumbar diskectomy;  Surgeon: Floyce Stakes, MD;  Location: [place] [facility];  Service: Neurosurgery;  Laterality: Right;  Right L4-5 Lumbar diskectomy   right arm  2008   rods from fractured arm   TUBAL LIGATION  1995    There were no vitals filed for this visit.   Subjective Assessment - 05/05/20 1111    Subjective PT states that sometimes when she is walking her knee feels like it just buckles out of jt.  Last week she noted that she was having increased heat and pain in her knee.    Limitations Walking;House hold activities;Standing    How long can you walk comfortably? 5 minutes was 3-4  minutes    Patient Stated Goals to get back to moving on her own    Currently in Pain? Yes  Pain Score 6     Pain Location Knee    Pain Orientation Right;Medial    Pain Descriptors / Indicators Sharp    Pain Type Acute pain    Pain Onset 1 to 4 weeks ago    Pain Frequency Intermittent    Aggravating Factors  random    Pain Relieving Factors ice    Effect of Pain on Daily Activities limits              [facility] PT Assessment - 05/05/20 0001      Assessment   Medical Diagnosis R TKA    Referring Provider (PT) Mayer Camel    Onset Date/Surgical Date 02/26/20    Next MD Visit Feb. 2nd    Prior Therapy Yes      Precautions   Precautions None       Restrictions   Weight Bearing Restrictions No      Prior Function   Level of Independence Independent      Cognition   Overall Cognitive Status Within Functional Limits for tasks assessed      Observation/Other Assessments   Observations Ambulates without AD, antalgic gait    Focus on Therapeutic Outcomes (FOTO)  65  limited was 66% limited      AROM   Right Knee Extension 3   was 10 lacking   Right Knee Flexion 124   was 116   Left Knee Extension 9   hyperextension   Left Knee Flexion 146      Strength   Right Hip Flexion 4+/5   was 3+   Right Hip Extension 3/5    Right Hip ABduction 5/5    Left Hip Flexion 4+/5    Right Knee Flexion 4/5   was 3+   Right Knee Extension 3+/5   was 3+   Left Knee Flexion 5/5    Left Knee Extension 5/5    Right Ankle Dorsiflexion 3+/5   limited by pain was 4-   Left Ankle Dorsiflexion 5/5      Palpation   Patella mobility WFL medial and lateral    Palpation comment TTP R quads, scar tissue around incision      Transfers   Comments labored, extends RLE using mostly LLE      Ambulation/Gait   Ambulation/Gait Yes    Ambulation/Gait Assistance 7: Independent    Assistive device None    Gait Pattern Decreased hip/knee flexion - right;Antalgic    Gait velocity decreased    Gait Comments 2MWT significant antalgic gait                         [facility] Adult PT Treatment/Exercise - 05/05/20 0001      Ambulation/Gait   Ambulation Distance (Feet) 272 Feet   was 250     Knee/Hip Exercises: Seated   Sit to Sand 1 set;5 reps      Knee/Hip Exercises: Supine   Quad Sets Both;5 reps    Heel Slides Right;1 set;5 reps    Straight Leg Raises Right;1 set      Knee/Hip Exercises: Sidelying   Hip ABduction Right;1 set                    PT Short Term Goals - 05/05/20 1135      PT SHORT TERM GOAL #1   Title Patient will be independent with HEP in order to improve functional outcomes.    Time 3  Period Weeks     Status On-going    Target Date 05/12/20      PT SHORT TERM GOAL #2   Title Patient will report at least 25% improvement in symptoms for improved quality of life.    Time 3    Period Weeks    Status On-going    Target Date 05/12/20             PT Long Term Goals - 05/05/20 1123      PT LONG TERM GOAL #1   Title Patient will report at least 75% improvement in symptoms for improved quality of life.    Time 6    Period Weeks    Status On-going      PT LONG TERM GOAL #2   Title Patient will improve FOTO score by at least 20 points in order to indicate improved tolerance to activity.    Time 6    Period Weeks    Status On-going      PT LONG TERM GOAL #3   Title Patient will be able to navigate stairs with reciprocal pattern without compensation in order to demonstrate improved LE strength.    Time 6    Period Weeks    Status On-going      PT LONG TERM GOAL #4   Title Patient will be able to ambulate at least 450 feet in 2MWT in order to demonstrate improved gait speed for community ambulation.    Time 6    Period Weeks    Status On-going                 Plan - 05/05/20 1131    Clinical Impression Statement Pt did not start her therapy due to other issues until 04/11/2020.  She has only had three visits due to cancellations in this time period.  She has not met her goals although she has improved in ROM and strength as the pt states that even though she did not come to therapy she continued to do her exercises.  Ms. Mccarrell continues to have decreased strength, increased pain, decreased activity tolerance and decreased balance.  She will continue to benefit from skilled PT to address these issues and improve her functioning level.    Personal Factors and Comorbidities Fitness;Behavior Pattern;Past/Current Experience;Comorbidity 3+;Time since onset of injury/illness/exacerbation    Comorbidities : Arthritis, Back pain, Osteoporosis, chronic knee pain     Examination-Activity Limitations Locomotion Level;Transfers;Bend;Stand;Stairs;Squat;Lift;Carry    Examination-Participation Restrictions Church;Meal Prep;Cleaning;Yard Work;Volunteer;Shop    Stability/Clinical Decision Making Stable/Uncomplicated    Rehab Potential Good    PT Frequency 2x / week    PT Duration 6 weeks    PT Treatment/Interventions ADLs/Self Care Home Management;Aquatic Therapy;Cryotherapy;Scientist, product/process development;Iontophoresis 89m/ml Dexamethasone;Moist Heat;Traction;Contrast Bath;Gait training;Stair training;Functional mobility training;Therapeutic activities;Therapeutic exercise;Balance training;Neuromuscular re-education;Patient/family education;Manual techniques;Dry needling;Energy conservation;Splinting;Taping;Passive range of motion;Scar mobilization;Compression bandaging;Orthotic Fit/Training;Spinal Manipulations;Joint Manipulations    PT Next Visit Plan Focus on  improving gait which is most likely part of her increased pain, mm strengthening of both hip and knee as well as balance.  Update HEP    PT Home Exercise Plan 12/16 LAQ, quad set, heel slides 12/21 squat, SLR    Consulted and Agree with Plan of Care Patient           Patient will benefit from skilled therapeutic intervention in order to improve the following deficits and impairments:  Abnormal gait,Decreased range of motion,Difficulty walking,Pain,Increased muscle spasms,Decreased endurance,Decreased activity tolerance,Decreased balance,Impaired flexibility,Improper body mechanics,Decreased strength,Decreased mobility  Visit Diagnosis: Right knee pain, unspecified chronicity  Muscle weakness (generalized)  Other abnormalities of gait and mobility  Other symptoms and signs involving the musculoskeletal system  Stiffness of right knee, not elsewhere classified     Problem List Patient Active Problem List   Diagnosis Date Noted   S/P right knee arthroscopy for chondroplasty patella  07/23/19  07/30/2019   Microcytic anemia 07/09/2019   Lumbar degenerative disc disease 09/22/2014   Lumbar herniated disc 06/11/2014   Chronic pain 05/14/2013   HNP (herniated nucleus pulposus), lumbar 05/14/2013   Leg weakness, bilateral 04/21/2013   Radicular pain of left lower extremity 03/31/2013   Retrolisthesis 03/31/2013   Effusion of knee joint 10/21/2012   Status post knee surgery 10/21/2012   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   Patellofemoral instability with pain 05/22/2012   Dislocation of patella 01/16/2012   Knee pain, bilateral 01/16/2012   Difficulty in walking 10/31/2011   Pain in right knee 10/31/2011   Weakness of right leg 10/31/2011   Knee pain 09/11/2011   Stiffness of joint, not elsewhere classified, lower leg 09/11/2011   Gait abnormality 09/11/2011   Muscle weakness (generalized) 09/11/2011   Knee instability 07/12/2011   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 [place], [place], [address] Phone: [phone]   Fax:  [phone]  Name: Karen Mercer MRN: [id number] Date of Birth: 09-08-1970

## 2020-05-10 ENCOUNTER — Other Ambulatory Visit: Payer: Self-pay

## 2020-05-10 ENCOUNTER — Ambulatory Visit (HOSPITAL_COMMUNITY): Payer: Medicare HMO | Attending: Orthopedic Surgery | Admitting: Physical Therapy

## 2020-05-10 DIAGNOSIS — M6281 Muscle weakness (generalized): Secondary | ICD-10-CM | POA: Diagnosis present

## 2020-05-10 DIAGNOSIS — R29898 Other symptoms and signs involving the musculoskeletal system: Secondary | ICD-10-CM | POA: Insufficient documentation

## 2020-05-10 DIAGNOSIS — M25661 Stiffness of right knee, not elsewhere classified: Secondary | ICD-10-CM | POA: Insufficient documentation

## 2020-05-10 DIAGNOSIS — R2689 Other abnormalities of gait and mobility: Secondary | ICD-10-CM | POA: Diagnosis present

## 2020-05-10 DIAGNOSIS — M25561 Pain in right knee: Secondary | ICD-10-CM | POA: Insufficient documentation

## 2020-05-10 NOTE — Therapy (Signed)
[place], [place], [address] Phone: [phone]   Fax:  [phone]  Physical Therapy Treatment  Patient Details  Name: JAMONICA SCHOFF MRN: [id number] Date of Birth: 17-Jun-1970 Referring Provider (PT): Mayer Camel   Encounter Date: 05/10/2020   PT End of Session - 05/10/20 1216    Visit Number 4    Number of Visits 12    Date for PT Re-Evaluation 06/02/20    Authorization Type Primary [employer] Secondary Medicaid    Authorization Time Period requesting 12 visits 04/21/20-06/02/20    Authorization - Visit Number 1    Authorization - Number of Visits 12    Progress Note Due on Visit 10    PT Start Time 1135    PT Stop Time 1215    PT Time Calculation (min) 40 min    Activity Tolerance Patient tolerated treatment well    Behavior During Therapy [facility] for tasks assessed/performed           Past Medical History:  Diagnosis Date  . Chronic back pain    both legs;DDD and stenosis   . Complication of anesthesia    had extreme headache after last back surgery - was in pacu for extended period  . Muscle spasm    takes Valium daily as needed  . Neuromuscular disorder ([place])   . PONV (postoperative nausea and vomiting)   . Rheumatoid arthritis [facility])     Past Surgical History:  Procedure Laterality Date  . BACK SURGERY     at surgical center  . CHONDROPLASTY Right 07/23/2019   Procedure: CHONDROPLASTY RIGHT PATELLA  AND LATERAL FEMUR;  Surgeon: Carole Civil, MD;  Location: [facility];  Service: Orthopedics;  Laterality: Right;  . KNEE ARTHROSCOPY  08/17/2011   Procedure: ARTHROSCOPY KNEE;  Surgeon: Carole Civil, MD;  Location: [facility];  Service: Orthopedics;  Laterality: Right;  with tibia tubercle transfer and proximal realignment  . KNEE ARTHROSCOPY Right 07/23/2019   Procedure: ARTHROSCOPY RIGHT KNEE;  Surgeon: Carole Civil, MD;  Location: [facility];  Service: Orthopedics;  Laterality: Right;   . KNEE ARTHROSCOPY WITH FULKERSON SLIDE Left 08/29/2012   Procedure: Arthroscopic lateral release with chondroplasty left knee;  Surgeon: Carole Civil, MD;  Location: [facility];  Service: Orthopedics;  Laterality: Left;  . KNEE ARTHROSCOPY WITH MEDIAL PATELLAR FEMORAL LIGAMENT RECONSTRUCTION Left 08/29/2012   Procedure: Open proximal realignment fulkerson;  Surgeon: Carole Civil, MD;  Location: [facility];  Service: Orthopedics;  Laterality: Left;  . KNEE SURGERY  2012   right knee-arthroscopy  . LUMBAR LAMINECTOMY/DECOMPRESSION MICRODISCECTOMY Right 06/11/2014   Procedure: Right L4-5 Lumbar diskectomy;  Surgeon: Floyce Stakes, MD;  Location: [place] [facility];  Service: Neurosurgery;  Laterality: Right;  Right L4-5 Lumbar diskectomy  . right arm  2008   rods from fractured arm  . TUBAL LIGATION  1995    There were no vitals filed for this visit.   Subjective Assessment - 05/10/20 1139    Subjective pt states she is having approx 5/10 pain in her medial/lateral Rt knee.    Currently in Pain? Yes    Pain Score 5     Pain Location Knee    Pain Orientation Right    Pain Descriptors / Indicators Aching;Sore    Pain Type Acute pain  [facility] Adult PT Treatment/Exercise - 05/10/20 0001      Knee/Hip Exercises: Aerobic   Recumbent Bike 3 minutes for dynamic warm up      Knee/Hip Exercises: Standing   Heel Raises Both;20 reps    Forward Lunges Right;15 reps;Limitations    Forward Lunges Limitations onto 4" step without UE assist    Lateral Step Up Right;2 sets;10 reps;Hand Hold: 1;Step Height: 4"    Lateral Step Up Limitations eccentric lowering    Forward Step Up Right;2 sets;10 reps;Step Height: 4";Hand Hold: 1    Step Down Right;2 sets;10 reps;Hand Hold: 1;Step Height: 4"    Step Down Limitations eccentric lowering    SLS with Vectors Rt LE 10X5" holds with 1 HHA    Gait Training 2RT focusing on equilalteral, heel toe, keeping Rt LE into  neutral and bending knee with swing through.                    PT Short Term Goals - 05/05/20 1135      PT SHORT TERM GOAL #1   Title Patient will be independent with HEP in order to improve functional outcomes.    Time 3    Period Weeks    Status On-going    Target Date 05/12/20      PT SHORT TERM GOAL #2   Title Patient will report at least 25% improvement in symptoms for improved quality of life.    Time 3    Period Weeks    Status On-going    Target Date 05/12/20             PT Long Term Goals - 05/05/20 1123      PT LONG TERM GOAL #1   Title Patient will report at least 75% improvement in symptoms for improved quality of life.    Time 6    Period Weeks    Status On-going      PT LONG TERM GOAL #2   Title Patient will improve FOTO score by at least 20 points in order to indicate improved tolerance to activity.    Time 6    Period Weeks    Status On-going      PT LONG TERM GOAL #3   Title Patient will be able to navigate stairs with reciprocal pattern without compensation in order to demonstrate improved LE strength.    Time 6    Period Weeks    Status On-going      PT LONG TERM GOAL #4   Title Patient will be able to ambulate at least 450 feet in 2MWT in order to demonstrate improved gait speed for community ambulation.    Time 6    Period Weeks    Status On-going                 Plan - 05/10/20 1220    Clinical Impression Statement Focused this session on improvingLE strength and improving gait quality.  Continues to ambulate with antalgic gait; worked on improving heel toe, increased knee flexion with swing through and increased Rt LE stance time.  Pt able to improve with increased gait speed and practice.  Began vectors to improve LE stablilty.  Completed step ups /downs utilizing eccentric control and proper alignment.  Some popping in Rt knee during lateral step ups but no pain voiced.  No other complaints or issues during session  today.    Personal Factors and Comorbidities Fitness;Behavior Pattern;Past/Current Experience;Comorbidity 3+;Time since onset of  injury/illness/exacerbation    Comorbidities : Arthritis, Back pain, Osteoporosis, chronic knee pain    Examination-Activity Limitations Locomotion Level;Transfers;Bend;Stand;Stairs;Squat;Lift;Carry    Examination-Participation Restrictions Church;Meal Prep;Cleaning;Yard Work;Volunteer;Shop    Stability/Clinical Decision Making Stable/Uncomplicated    Rehab Potential Good    PT Frequency 2x / week    PT Duration 6 weeks    PT Treatment/Interventions ADLs/Self Care Home Management;Aquatic Therapy;Cryotherapy;Scientist, product/process development;Iontophoresis 7m/ml Dexamethasone;Moist Heat;Traction;Contrast Bath;Gait training;Stair training;Functional mobility training;Therapeutic activities;Therapeutic exercise;Balance training;Neuromuscular re-education;Patient/family education;Manual techniques;Dry needling;Energy conservation;Splinting;Taping;Passive range of motion;Scar mobilization;Compression bandaging;Orthotic Fit/Training;Spinal Manipulations;Joint Manipulations    PT Next Visit Plan Continue with focus on  improving gait (which is most likely part of her increased pain), mm strengthening of both hip and knee as well as balance.  Update HEP    PT Home Exercise Plan 12/16 LAQ, quad set, heel slides 12/21 squat, SLR    Consulted and Agree with Plan of Care Patient           Patient will benefit from skilled therapeutic intervention in order to improve the following deficits and impairments:  Abnormal gait,Decreased range of motion,Difficulty walking,Pain,Increased muscle spasms,Decreased endurance,Decreased activity tolerance,Decreased balance,Impaired flexibility,Improper body mechanics,Decreased strength,Decreased mobility  Visit Diagnosis: Right knee pain, unspecified chronicity  Muscle weakness (generalized)  Other abnormalities of gait and  mobility  Other symptoms and signs involving the musculoskeletal system     Problem List Patient Active Problem List   Diagnosis Date Noted  . S/P right knee arthroscopy for chondroplasty patella 07/23/19  07/30/2019  . Microcytic anemia 07/09/2019  . Lumbar degenerative disc disease 09/22/2014  . Lumbar herniated disc 06/11/2014  . Chronic pain 05/14/2013  . HNP (herniated nucleus pulposus), lumbar 05/14/2013  . Leg weakness, bilateral 04/21/2013  . Radicular pain of left lower extremity 03/31/2013  . Retrolisthesis 03/31/2013  . Effusion of knee joint 10/21/2012  . Status post knee surgery 10/21/2012  . 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  . Patellofemoral instability with pain 05/22/2012  . Dislocation of patella 01/16/2012  . Knee pain, bilateral 01/16/2012  . Difficulty in walking 10/31/2011  . Pain in right knee 10/31/2011  . Weakness of right leg 10/31/2011  . Knee pain 09/11/2011  . Stiffness of joint, not elsewhere classified, lower leg 09/11/2011  . Gait abnormality 09/11/2011  . Muscle weakness (generalized) 09/11/2011  . Knee instability 07/12/2011  . 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 Patellar instability 09/14/2010  . HEMARTHROSIS 04/04/2010  . SUBLUXATION PATELLAR (MALALIGNMENT) 05/04/2009  . 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   ATeena Irani PTA/CLT [phone] FTeena Irani1/08/2020, 12:26 PM  [place] [address]Phone: [phone]  Fax:  [phone] Name: YJATZIRI GOFFREDOMRN: [id number] of Birth: [birth year]

## 2020-05-12 ENCOUNTER — Other Ambulatory Visit: Payer: Self-pay

## 2020-05-12 ENCOUNTER — Encounter (HOSPITAL_COMMUNITY): Payer: Self-pay | Admitting: Physical Therapy

## 2020-05-12 ENCOUNTER — Ambulatory Visit (HOSPITAL_COMMUNITY): Payer: Medicare HMO | Admitting: Physical Therapy

## 2020-05-12 DIAGNOSIS — M6281 Muscle weakness (generalized): Secondary | ICD-10-CM

## 2020-05-12 DIAGNOSIS — M25561 Pain in right knee: Secondary | ICD-10-CM

## 2020-05-12 DIAGNOSIS — M25661 Stiffness of right knee, not elsewhere classified: Secondary | ICD-10-CM

## 2020-05-12 DIAGNOSIS — R2689 Other abnormalities of gait and mobility: Secondary | ICD-10-CM

## 2020-05-12 DIAGNOSIS — R29898 Other symptoms and signs involving the musculoskeletal system: Secondary | ICD-10-CM

## 2020-05-12 NOTE — Therapy (Signed)
[place], [place], [address] Phone: [phone]   Fax:  [phone]  Physical Therapy Treatment  Patient Details  Name: Karen Mercer MRN: [id number] Date of Birth: 15-Oct-1970 Referring Provider (PT): Mayer Camel   Encounter Date: 05/12/2020   PT End of Session - 05/12/20 1139    Visit Number 5    Number of Visits 12    Date for PT Re-Evaluation 06/02/20    Authorization Type Primary [employer] Medicare Secondary Medicaid    Authorization Time Period requesting 12 visits 04/21/20-06/02/20    Authorization - Visit Number 5    Authorization - Number of Visits 12    Progress Note Due on Visit 10    PT Start Time 1137    PT Stop Time 1215    PT Time Calculation (min) 38 min    Activity Tolerance Patient tolerated treatment well    Behavior During Therapy [facility] for tasks assessed/performed           Past Medical History:  Diagnosis Date  . Chronic back pain    both legs;DDD and stenosis   . Complication of anesthesia    had extreme headache after last back surgery - was in pacu for extended period  . Muscle spasm    takes Valium daily as needed  . Neuromuscular disorder ([place])   . PONV (postoperative nausea and vomiting)   . Rheumatoid arthritis [facility])     Past Surgical History:  Procedure Laterality Date  . BACK SURGERY     at surgical center  . CHONDROPLASTY Right 07/23/2019   Procedure: CHONDROPLASTY RIGHT PATELLA  AND LATERAL FEMUR;  Surgeon: Carole Civil, MD;  [facility];  Service: Orthopedics;  Laterality: Right;  . KNEE ARTHROSCOPY  08/17/2011   Procedure: ARTHROSCOPY KNEE;  Surgeon: Carole Civil, MD;  Location: [facility];  Service: Orthopedics;  Laterality: Right;  with tibia tubercle transfer and proximal realignment  . KNEE ARTHROSCOPY Right 07/23/2019   Procedure: ARTHROSCOPY RIGHT KNEE;  Surgeon: Carole Civil, MD;  Location: [facility];  Service: Orthopedics;  Laterality: Right;   . KNEE ARTHROSCOPY WITH FULKERSON SLIDE Left 08/29/2012   Procedure: Arthroscopic lateral release with chondroplasty left knee;  Surgeon: Carole Civil, MD;  Location: [facility];  Service: Orthopedics;  Laterality: Left;  . KNEE ARTHROSCOPY WITH MEDIAL PATELLAR FEMORAL LIGAMENT RECONSTRUCTION Left 08/29/2012   Procedure: Open proximal realignment fulkerson;  Surgeon: Carole Civil, MD;  Location: [facility];  Service: Orthopedics;  Laterality: Left;  . KNEE SURGERY  2012   right knee-arthroscopy  . LUMBAR LAMINECTOMY/DECOMPRESSION MICRODISCECTOMY Right 06/11/2014   Procedure: Right L4-5 Lumbar diskectomy;  Surgeon: Floyce Stakes, MD;  Location: [place] [facility];  Service: Neurosurgery;  Laterality: Right;  Right L4-5 Lumbar diskectomy  . right arm  2008   rods from fractured arm  . TUBAL LIGATION  1995    There were no vitals filed for this visit.   Subjective Assessment - 05/12/20 1142    Subjective Her knee is feeling about the same. No pain today.    Currently in Pain? No/denies                             [facility] Adult PT Treatment/Exercise - 05/12/20 0001      Knee/Hip Exercises: Stretches   Gastroc Stretch Both;3 reps;30 seconds      Knee/Hip Exercises: Aerobic   Recumbent  Bike 5 minutes for dynamic warm up      Knee/Hip Exercises: Standing   Heel Raises Both;20 reps    Heel Raises Limitations on edge of step    Lateral Step Up Right;2 sets;10 reps;Hand Hold: 1;Step Height: 6"    Lateral Step Up Limitations eccentric lowering    Forward Step Up Right;2 sets;10 reps;Hand Hold: 1;Step Height: 6"    Step Down Right;2 sets;10 reps;Hand Hold: 1;Step Height: 4"    Step Down Limitations eccentric lowering    SLS with Vectors bilateral 10X5" holds with 1 HHA    Gait Training 2RT focusing on equilalteral, heel toe, keeping Rt LE into neutral and bending knee with swing through.    Other Standing Knee Exercises TKE with thick blue band 10x 5 second holds                   PT Education - 05/12/20 1139    Education Details Patient educated on HEP, exercise mechanics    Person(s) Educated Patient    Methods Explanation;Demonstration    Comprehension Verbalized understanding;Returned demonstration            PT Short Term Goals - 05/05/20 1135      PT SHORT TERM GOAL #1   Title Patient will be independent with HEP in order to improve functional outcomes.    Time 3    Period Weeks    Status On-going    Target Date 05/12/20      PT SHORT TERM GOAL #2   Title Patient will report at least 25% improvement in symptoms for improved quality of life.    Time 3    Period Weeks    Status On-going    Target Date 05/12/20             PT Long Term Goals - 05/05/20 1123      PT LONG TERM GOAL #1   Title Patient will report at least 75% improvement in symptoms for improved quality of life.    Time 6    Period Weeks    Status On-going      PT LONG TERM GOAL #2   Title Patient will improve FOTO score by at least 20 points in order to indicate improved tolerance to activity.    Time 6    Period Weeks    Status On-going      PT LONG TERM GOAL #3   Title Patient will be able to navigate stairs with reciprocal pattern without compensation in order to demonstrate improved LE strength.    Time 6    Period Weeks    Status On-going      PT LONG TERM GOAL #4   Title Patient will be able to ambulate at least 450 feet in 2MWT in order to demonstrate improved gait speed for community ambulation.    Time 6    Period Weeks    Status On-going                 Plan - 05/12/20 1140    Clinical Impression Statement Patient completes bike at beginning of session for dynamic warm up. Patient able to progress to increased step height today without c/o pain. She continues to lack eccentric strength but is showing improving eccentric motor control with lateral step down. Patient requires cueing for rest breaks due to tendency to keep performing  exercises despite being given number of reps. Patient intitially able to complete SLS with vectors without UE  support but requires HHA with fatigue. Patient will continue to benefit skilled physical therapy in order to reduce impairment and improve function.    Personal Factors and Comorbidities Fitness;Behavior Pattern;Past/Current Experience;Comorbidity 3+;Time since onset of injury/illness/exacerbation    Comorbidities : Arthritis, Back pain, Osteoporosis, chronic knee pain    Examination-Activity Limitations Locomotion Level;Transfers;Bend;Stand;Stairs;Squat;Lift;Carry    Examination-Participation Restrictions Church;Meal Prep;Cleaning;Yard Work;Volunteer;Shop    Stability/Clinical Decision Making Stable/Uncomplicated    Rehab Potential Good    PT Frequency 2x / week    PT Duration 6 weeks    PT Treatment/Interventions ADLs/Self Care Home Management;Aquatic Therapy;Cryotherapy;Scientist, product/process development;Iontophoresis 42m/ml Dexamethasone;Moist Heat;Traction;Contrast Bath;Gait training;Stair training;Functional mobility training;Therapeutic activities;Therapeutic exercise;Balance training;Neuromuscular re-education;Patient/family education;Manual techniques;Dry needling;Energy conservation;Splinting;Taping;Passive range of motion;Scar mobilization;Compression bandaging;Orthotic Fit/Training;Spinal Manipulations;Joint Manipulations    PT Next Visit Plan Continue with focus on  improving gait (which is most likely part of her increased pain), mm strengthening of both hip and knee as well as balance.  Update HEP    PT Home Exercise Plan 12/16 LAQ, quad set, heel slides 12/21 squat, SLR    Consulted and Agree with Plan of Care Patient           Patient will benefit from skilled therapeutic intervention in order to improve the following deficits and impairments:  Abnormal gait,Decreased range of motion,Difficulty walking,Pain,Increased muscle spasms,Decreased endurance,Decreased activity  tolerance,Decreased balance,Impaired flexibility,Improper body mechanics,Decreased strength,Decreased mobility  Visit Diagnosis: Right knee pain, unspecified chronicity  Muscle weakness (generalized)  Other abnormalities of gait and mobility  Other symptoms and signs involving the musculoskeletal system  Stiffness of right knee, not elsewhere classified     Problem List Patient Active Problem List   Diagnosis Date Noted  . S/P right knee arthroscopy for chondroplasty patella 07/23/19  07/30/2019  . Microcytic anemia 07/09/2019  . Lumbar degenerative disc disease 09/22/2014  . Lumbar herniated disc 06/11/2014  . Chronic pain 05/14/2013  . HNP (herniated nucleus pulposus), lumbar 05/14/2013  . Leg weakness, bilateral 04/21/2013  . Radicular pain of left lower extremity 03/31/2013  . Retrolisthesis 03/31/2013  . Effusion of knee joint 10/21/2012  . Status post knee surgery 10/21/2012  . 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  . Patellofemoral instability with pain 05/22/2012  . Dislocation of patella 01/16/2012  . Knee pain, bilateral 01/16/2012  . Difficulty in walking 10/31/2011  . Pain in right knee 10/31/2011  . Weakness of right leg 10/31/2011  . Knee pain 09/11/2011  . Stiffness of joint, not elsewhere classified, lower leg 09/11/2011  . Gait abnormality 09/11/2011  . Muscle weakness (generalized) 09/11/2011  . Knee instability 07/12/2011  . 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 Patellar instability 09/14/2010  . HEMARTHROSIS 04/04/2010  . SUBLUXATION PATELLAR (MALALIGNMENT) 05/04/2009  . 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   12:15 PM, 05/12/20 AMearl LatinPT, DPT Physical Therapist at [place] [address]Phone: [phone]  Fax:  [phone] Name: YBRUNETTA NEWINGHAMMRN: [id number] of Birth: 524-Aug-1972

## 2020-05-13 ENCOUNTER — Other Ambulatory Visit: Payer: Medicare HMO

## 2020-05-13 ENCOUNTER — Other Ambulatory Visit: Payer: Self-pay

## 2020-05-13 DIAGNOSIS — Z20822 Contact with and (suspected) exposure to covid-19: Secondary | ICD-10-CM

## 2020-05-17 ENCOUNTER — Ambulatory Visit: Payer: Self-pay | Admitting: *Deleted

## 2020-05-17 LAB — NOVEL CORONAVIRUS, NAA: SARS-CoV-2, NAA: DETECTED — AB

## 2020-05-17 NOTE — Telephone Encounter (Signed)
Patient notified of positive COVID-19 test results. Pt verbalized understanding. Pt reports symptoms of no symptoms. Criteria for self-isolation:  -Please quarantine and isolate at home  for at least 10 days since symptoms started AND - At least 24 hours fever free without the use of fever reducing medications such as Tylenol or Ibuprofen AND - Improvement in respiratory symptoms Use over-the-counter medications for symptoms.If you develop respiratory issues/distress, seek medical care in the Emergency Department.  If you must leave home or if you have to be around others please wear a mask. Please limit contact with immediate family members in the home, practice social distancing, frequent handwashing and clean hard surfaces touched frequently with household cleaning products. Members of your household will also need to quarantine and test. Pt informed that the health department will likely follow up and may have additional recommendations.

## 2020-05-19 ENCOUNTER — Ambulatory Visit (HOSPITAL_COMMUNITY): Payer: Medicare HMO | Admitting: Physical Therapy

## 2020-05-19 ENCOUNTER — Other Ambulatory Visit: Payer: Self-pay

## 2020-05-19 ENCOUNTER — Other Ambulatory Visit: Payer: Medicare HMO

## 2020-05-19 DIAGNOSIS — Z20822 Contact with and (suspected) exposure to covid-19: Secondary | ICD-10-CM

## 2020-05-22 LAB — NOVEL CORONAVIRUS, NAA: SARS-CoV-2, NAA: NOT DETECTED

## 2020-05-24 ENCOUNTER — Ambulatory Visit (HOSPITAL_COMMUNITY): Payer: Medicare HMO | Admitting: Physical Therapy

## 2020-05-24 ENCOUNTER — Telehealth (HOSPITAL_COMMUNITY): Payer: Self-pay | Admitting: Physical Therapy

## 2020-05-24 NOTE — Telephone Encounter (Signed)
L/m request return ph call to confirm cx todya and r/s.

## 2020-05-26 ENCOUNTER — Encounter (HOSPITAL_COMMUNITY): Payer: Self-pay | Admitting: Physical Therapy

## 2020-05-26 ENCOUNTER — Other Ambulatory Visit: Payer: Self-pay

## 2020-05-26 ENCOUNTER — Ambulatory Visit (HOSPITAL_COMMUNITY): Payer: Medicare HMO | Admitting: Physical Therapy

## 2020-05-26 DIAGNOSIS — R29898 Other symptoms and signs involving the musculoskeletal system: Secondary | ICD-10-CM

## 2020-05-26 DIAGNOSIS — M25561 Pain in right knee: Secondary | ICD-10-CM

## 2020-05-26 DIAGNOSIS — R2689 Other abnormalities of gait and mobility: Secondary | ICD-10-CM

## 2020-05-26 DIAGNOSIS — M6281 Muscle weakness (generalized): Secondary | ICD-10-CM

## 2020-05-26 DIAGNOSIS — M25661 Stiffness of right knee, not elsewhere classified: Secondary | ICD-10-CM

## 2020-05-26 NOTE — Therapy (Signed)
[place], [place], [address] Phone: [phone]   Fax:  [phone]  Physical Therapy Treatment  Patient Details  Name: Karen Mercer MRN: [id number] Date of Birth: [birth year] Referring Provider (PT): Mayer Camel   Encounter Date: 05/26/2020   PT End of Session - 05/26/20 1127    Visit Number 6    Number of Visits 12    Date for PT Re-Evaluation 06/02/20    Authorization Type Primary [employer] Secondary Medicaid    Authorization Time Period 04/21/20-06/02/20    Authorization - Visit Number 6    Authorization - Number of Visits 12    Progress Note Due on Visit 10    PT Start Time 1122    PT Stop Time 1200    PT Time Calculation (min) 38 min    Activity Tolerance Patient tolerated treatment well;Patient limited by pain    Behavior During Therapy [facility] for tasks assessed/performed           Past Medical History:  Diagnosis Date  . Chronic back pain    both legs;DDD and stenosis   . Complication of anesthesia    had extreme headache after last back surgery - was in pacu for extended period  . Muscle spasm    takes Valium daily as needed  . Neuromuscular disorder ([place])   . PONV (postoperative nausea and vomiting)   . Rheumatoid arthritis [facility])     Past Surgical History:  Procedure Laterality Date  . BACK SURGERY     at surgical center  . CHONDROPLASTY Right 07/23/2019   Procedure: CHONDROPLASTY RIGHT PATELLA  AND LATERAL FEMUR;  Surgeon: Carole Civil, MD;  Location: [facility];  Service: Orthopedics;  Laterality: Right;  . KNEE ARTHROSCOPY  08/17/2011   Procedure: ARTHROSCOPY KNEE;  Surgeon: Carole Civil, MD;  Location: [facility];  Service: Orthopedics;  Laterality: Right;  with tibia tubercle transfer and proximal realignment  . KNEE ARTHROSCOPY Right 07/23/2019   Procedure: ARTHROSCOPY RIGHT KNEE;  Surgeon: Carole Civil, MD;  Location: [facility];  Service: Orthopedics;  Laterality:  Right;  . KNEE ARTHROSCOPY WITH FULKERSON SLIDE Left 08/29/2012   Procedure: Arthroscopic lateral release with chondroplasty left knee;  Surgeon: Carole Civil, MD;  Location: [facility];  Service: Orthopedics;  Laterality: Left;  . KNEE ARTHROSCOPY WITH MEDIAL PATELLAR FEMORAL LIGAMENT RECONSTRUCTION Left 08/29/2012   Procedure: Open proximal realignment fulkerson;  Surgeon: Carole Civil, MD;  Location: [facility];  Service: Orthopedics;  Laterality: Left;  . KNEE SURGERY  2012   right knee-arthroscopy  . LUMBAR LAMINECTOMY/DECOMPRESSION MICRODISCECTOMY Right 06/11/2014   Procedure: Right L4-5 Lumbar diskectomy;  Surgeon: Floyce Stakes, MD;  Location: [place] [facility];  Service: Neurosurgery;  Laterality: Right;  Right L4-5 Lumbar diskectomy  . right arm  2008   rods from fractured arm  . TUBAL LIGATION  1995    There were no vitals filed for this visit.   Subjective Assessment - 05/26/20 1126    Subjective Patient  says her knee hurts today, gave her a fit yesterday. About a 5    Currently in Pain? Yes    Pain Score 5     Pain Location Knee    Pain Orientation Right    Pain Descriptors / Indicators Aching;Throbbing;Dull    Pain Type Acute pain    Pain Onset More than a month ago    Pain Frequency Intermittent  [place] Adult PT Treatment/Exercise - 05/26/20 0001      Knee/Hip Exercises: Stretches   Gastroc Stretch Both;3 reps;30 seconds    Gastroc Stretch Limitations slant board      Knee/Hip Exercises: Aerobic   Recumbent Bike 4 minutes dynamic warm up      Knee/Hip Exercises: Field seismologist for Information systems manager walkouts 40# x10      Knee/Hip Exercises: Standing   Heel Raises Both;20 reps    Heel Raises Limitations on edge of step    Terminal Knee Extension Right;1 set;15 reps;Theraband    Theraband Level (Terminal Knee Extension) Level 3 (Green)    Hip Abduction Both;2 sets;10 reps    Abduction Limitations GTB     Hip Extension Both;2 sets;10 reps    Extension Limitations GTB    Lateral Step Up Both;1 set;15 reps;Hand Hold: 1;Step Height: 6"    Forward Step Up Both;1 set;15 reps;Hand Hold: 1;Step Height: 6"    SLS 3 x 15" solid floor      Knee/Hip Exercises: Seated   Sit to Sand 1 set;10 reps;without UE support                    PT Short Term Goals - 05/05/20 1135      PT SHORT TERM GOAL #1   Title Patient will be independent with HEP in order to improve functional outcomes.    Time 3    Period Weeks    Status On-going    Target Date 05/12/20      PT SHORT TERM GOAL #2   Title Patient will report at least 25% improvement in symptoms for improved quality of life.    Time 3    Period Weeks    Status On-going    Target Date 05/12/20             PT Long Term Goals - 05/05/20 1123      PT LONG TERM GOAL #1   Title Patient will report at least 75% improvement in symptoms for improved quality of life.    Time 6    Period Weeks    Status On-going      PT LONG TERM GOAL #2   Title Patient will improve FOTO score by at least 20 points in order to indicate improved tolerance to activity.    Time 6    Period Weeks    Status On-going      PT LONG TERM GOAL #3   Title Patient will be able to navigate stairs with reciprocal pattern without compensation in order to demonstrate improved LE strength.    Time 6    Period Weeks    Status On-going      PT LONG TERM GOAL #4   Title Patient will be able to ambulate at least 450 feet in 2MWT in order to demonstrate improved gait speed for community ambulation.    Time 6    Period Weeks    Status On-going                 Plan - 05/26/20 1210    Clinical Impression Statement Patient with mild knee pain throughout session. Activity graded per patient tolerance. Progressed LE strength with focus on added exercise for hip and quad strengthening exercises. Patient cued on proper form and educated on purpose of all added  activity. Patient shows good knee ROM but continues to be limited by pain and decreased RT knee stability with functional  activity. Patient will continue to benefit from skilled therapy services to address ongoing deficits to reduce pain and improve functional mobility.    Personal Factors and Comorbidities Fitness;Behavior Pattern;Past/Current Experience;Comorbidity 3+;Time since onset of injury/illness/exacerbation    Comorbidities : Arthritis, Back pain, Osteoporosis, chronic knee pain    Examination-Activity Limitations Locomotion Level;Transfers;Bend;Stand;Stairs;Squat;Lift;Carry    Examination-Participation Restrictions Church;Meal Prep;Cleaning;Yard Work;Volunteer;Shop    Stability/Clinical Decision Making Stable/Uncomplicated    Rehab Potential Good    PT Frequency 2x / week    PT Duration 6 weeks    PT Treatment/Interventions ADLs/Self Care Home Management;Aquatic Therapy;Cryotherapy;Scientist, product/process development;Iontophoresis 19m/ml Dexamethasone;Moist Heat;Traction;Contrast Bath;Gait training;Stair training;Functional mobility training;Therapeutic activities;Therapeutic exercise;Balance training;Neuromuscular re-education;Patient/family education;Manual techniques;Dry needling;Energy conservation;Splinting;Taping;Passive range of motion;Scar mobilization;Compression bandaging;Orthotic Fit/Training;Spinal Manipulations;Joint Manipulations    PT Next Visit Plan Continue with focus on  improving gait (which is most likely part of her increased pain), mm strengthening of both hip and knee as well as balance.    PT Home Exercise Plan 12/16 LAQ, quad set, heel slides 12/21 squat, SLR 05/26/20: heel raise, hip abd/ ext, single leg balance    Consulted and Agree with Plan of Care Patient           Patient will benefit from skilled therapeutic intervention in order to improve the following deficits and impairments:  Abnormal gait,Decreased range of motion,Difficulty walking,Pain,Increased  muscle spasms,Decreased endurance,Decreased activity tolerance,Decreased balance,Impaired flexibility,Improper body mechanics,Decreased strength,Decreased mobility  Visit Diagnosis: Right knee pain, unspecified chronicity  Muscle weakness (generalized)  Other abnormalities of gait and mobility  Other symptoms and signs involving the musculoskeletal system  Stiffness of right knee, not elsewhere classified     Problem List Patient Active Problem List   Diagnosis Date Noted  . S/P right knee arthroscopy for chondroplasty patella 07/23/19  07/30/2019  . Microcytic anemia 07/09/2019  . Lumbar degenerative disc disease 09/22/2014  . Lumbar herniated disc 06/11/2014  . Chronic pain 05/14/2013  . HNP (herniated nucleus pulposus), lumbar 05/14/2013  . Leg weakness, bilateral 04/21/2013  . Radicular pain of left lower extremity 03/31/2013  . Retrolisthesis 03/31/2013  . Effusion of knee joint 10/21/2012  . Status post knee surgery 10/21/2012  . 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  . Patellofemoral instability with pain 05/22/2012  . Dislocation of patella 01/16/2012  . Knee pain, bilateral 01/16/2012  . Difficulty in walking 10/31/2011  . Pain in right knee 10/31/2011  . Weakness of right leg 10/31/2011  . Knee pain 09/11/2011  . Stiffness of joint, not elsewhere classified, lower leg 09/11/2011  . Gait abnormality 09/11/2011  . Muscle weakness (generalized) 09/11/2011  . Knee instability 07/12/2011  . 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 Patellar instability 09/14/2010  . HEMARTHROSIS 04/04/2010  . SUBLUXATION PATELLAR (MALALIGNMENT) 05/04/2009  . 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    12:12 PM, 05/26/20 CJosue HectorPT DPT  Physical Therapist [facility] ([phone]  [facility]SPlat[address] [place] [address]Phone: [phone]  Fax:  [phone] Name: Karen KANTORMRN: [id number] of Birth: [birth year]

## 2020-05-31 ENCOUNTER — Ambulatory Visit (HOSPITAL_COMMUNITY): Payer: Medicare HMO | Admitting: Physical Therapy

## 2020-05-31 ENCOUNTER — Encounter (HOSPITAL_COMMUNITY): Payer: Self-pay | Admitting: Physical Therapy

## 2020-05-31 ENCOUNTER — Other Ambulatory Visit: Payer: Self-pay

## 2020-05-31 DIAGNOSIS — R29898 Other symptoms and signs involving the musculoskeletal system: Secondary | ICD-10-CM

## 2020-05-31 DIAGNOSIS — M25561 Pain in right knee: Secondary | ICD-10-CM | POA: Diagnosis not present

## 2020-05-31 DIAGNOSIS — R2689 Other abnormalities of gait and mobility: Secondary | ICD-10-CM

## 2020-05-31 DIAGNOSIS — M6281 Muscle weakness (generalized): Secondary | ICD-10-CM

## 2020-05-31 NOTE — Therapy (Signed)
[place], [place], [address] Phone: [phone]   Fax:  [phone]  Physical Therapy Treatment  Patient Details  Name: Karen Mercer MRN: [id number] Date of Birth: 1970-10-31 Referring Provider (PT): Mayer Camel   Encounter Date: 05/31/2020   PT End of Session - 05/31/20 1129    Visit Number 7    Number of Visits 12    Date for PT Re-Evaluation 06/02/20    Authorization Type Primary [employer] Secondary Medicaid    Authorization Time Period 04/21/20-06/02/20    Authorization - Visit Number 7    Authorization - Number of Visits 12    Progress Note Due on Visit 10    PT Start Time 1130    PT Stop Time 1210    PT Time Calculation (min) 40 min    Activity Tolerance Patient tolerated treatment well;Patient limited by pain    Behavior During Therapy [facility] for tasks assessed/performed           Past Medical History:  Diagnosis Date  . Chronic back pain    both legs;DDD and stenosis   . Complication of anesthesia    had extreme headache after last back surgery - was in pacu for extended period  . Muscle spasm    takes Valium daily as needed  . Neuromuscular disorder ([place])   . PONV (postoperative nausea and vomiting)   . Rheumatoid arthritis [facility])     Past Surgical History:  Procedure Laterality Date  . BACK SURGERY     at surgical center  . CHONDROPLASTY Right 07/23/2019   Procedure: CHONDROPLASTY RIGHT PATELLA  AND LATERAL FEMUR;  Surgeon: Carole Civil, MD;  Location: [facility];  Service: Orthopedics;  Laterality: Right;  . KNEE ARTHROSCOPY  08/17/2011   Procedure: ARTHROSCOPY KNEE;  Surgeon: Carole Civil, MD;  Location: [facility];  Service: Orthopedics;  Laterality: Right;  with tibia tubercle transfer and proximal realignment  . KNEE ARTHROSCOPY Right 07/23/2019   Procedure: ARTHROSCOPY RIGHT KNEE;  Surgeon: Carole Civil, MD;  Location: [facility];  Service: Orthopedics;  Laterality:  Right;  . KNEE ARTHROSCOPY WITH FULKERSON SLIDE Left 08/29/2012   Procedure: Arthroscopic lateral release with chondroplasty left knee;  Surgeon: Carole Civil, MD;  Location[facility];  Service: Orthopedics;  Laterality: Left;  . KNEE ARTHROSCOPY WITH MEDIAL PATELLAR FEMORAL LIGAMENT RECONSTRUCTION Left 08/29/2012   Procedure: Open proximal realignment fulkerson;  Surgeon: Carole Civil, MD;  Location: [facility];  Service: Orthopedics;  Laterality: Left;  . KNEE SURGERY  2012   right knee-arthroscopy  . LUMBAR LAMINECTOMY/DECOMPRESSION MICRODISCECTOMY Right 06/11/2014   Procedure: Right L4-5 Lumbar diskectomy;  Surgeon: Floyce Stakes, MD;  Location: [place] [facility];  Service: Neurosurgery;  Laterality: Right;  Right L4-5 Lumbar diskectomy  . right arm  2008   rods from fractured arm  . TUBAL LIGATION  1995    There were no vitals filed for this visit.   Subjective Assessment - 05/31/20 1131    Subjective Patient states her knee has been swelling. She continues to have pain on the inside of her knee. She remains limited by stiffness.    Currently in Pain? Yes    Pain Score 4     Pain Location Knee    Pain Type Surgical pain    Pain Onset More than a month ago  [place] Adult PT Treatment/Exercise - 05/31/20 0001      Knee/Hip Exercises: Stretches   Gastroc Stretch Both;3 reps;30 seconds    Gastroc Stretch Limitations slant board      Knee/Hip Exercises: Aerobic   Recumbent Bike 5 minutes dynamic warm up      Knee/Hip Exercises: Field seismologist for Information systems manager walkouts 40# x10      Knee/Hip Exercises: Standing   Heel Raises Both;20 reps    Heel Raises Limitations on edge of step    Hip Flexion Both;2 sets;10 reps    Hip Flexion Limitations GTB    Hip Abduction Both;2 sets;10 reps    Abduction Limitations GTB    Hip Extension Both;2 sets;10 reps    Extension Limitations GTB    Lateral Step Up Right;2 sets;10  reps;Hand Hold: 1;Step Height: 6"    Lateral Step Up Limitations eccentric lowering    Forward Step Up 15 reps;Hand Hold: 1;Step Height: 6";Right;2 sets    Step Down Right;2 sets;10 reps;Hand Hold: 1;Step Height: 6"    Step Down Limitations eccentric lowering    SLS with Vectors 5x with mini squat      Knee/Hip Exercises: Supine   Knee Extension AROM    Knee Extension Limitations lacking 1    Knee Flexion AROM    Knee Flexion Limitations 126                  PT Education - 05/31/20 1131    Education Details Patient educated on HEP, exercise mechanics    Person(s) Educated Patient    Methods Explanation;Demonstration    Comprehension Verbalized understanding;Returned demonstration            PT Short Term Goals - 05/05/20 1135      PT SHORT TERM GOAL #1   Title Patient will be independent with HEP in order to improve functional outcomes.    Time 3    Period Weeks    Status On-going    Target Date 05/12/20      PT SHORT TERM GOAL #2   Title Patient will report at least 25% improvement in symptoms for improved quality of life.    Time 3    Period Weeks    Status On-going    Target Date 05/12/20             PT Long Term Goals - 05/05/20 1123      PT LONG TERM GOAL #1   Title Patient will report at least 75% improvement in symptoms for improved quality of life.    Time 6    Period Weeks    Status On-going      PT LONG TERM GOAL #2   Title Patient will improve FOTO score by at least 20 points in order to indicate improved tolerance to activity.    Time 6    Period Weeks    Status On-going      PT LONG TERM GOAL #3   Title Patient will be able to navigate stairs with reciprocal pattern without compensation in order to demonstrate improved LE strength.    Time 6    Period Weeks    Status On-going      PT LONG TERM GOAL #4   Title Patient will be able to ambulate at least 450 feet in 2MWT in order to demonstrate improved gait speed for community  ambulation.    Time 6    Period Weeks    Status On-going  Plan - 05/31/20 1130    Clinical Impression Statement Patient showing good ROM from lacking 1 to 126 today. Patient continues to ambulate lacking TKE with antalgic gait despite cueing for heel toe gait pattern likely due to impaired quad strength and pain. Patient showing improving eccentric strength but requires UE support for balance and continued strength deficits especially with forward step down. Patient fatigues quickly with hip resistance exercises requiring cueing for controlled movements. Patient gait improves at end of session with cueing. Will reassess patient next session with probable recert. Patient will continue to benefit from skilled physical therapy in order to improve function and reduce impairment.    Personal Factors and Comorbidities Fitness;Behavior Pattern;Past/Current Experience;Comorbidity 3+;Time since onset of injury/illness/exacerbation    Comorbidities : Arthritis, Back pain, Osteoporosis, chronic knee pain    Examination-Activity Limitations Locomotion Level;Transfers;Bend;Stand;Stairs;Squat;Lift;Carry    Examination-Participation Restrictions Church;Meal Prep;Cleaning;Yard Work;Volunteer;Shop    Stability/Clinical Decision Making Stable/Uncomplicated    Rehab Potential Good    PT Frequency 2x / week    PT Duration 6 weeks    PT Treatment/Interventions ADLs/Self Care Home Management;Aquatic Therapy;Cryotherapy;Scientist, product/process development;Iontophoresis 57m/ml Dexamethasone;Moist Heat;Traction;Contrast Bath;Gait training;Stair training;Functional mobility training;Therapeutic activities;Therapeutic exercise;Balance training;Neuromuscular re-education;Patient/family education;Manual techniques;Dry needling;Energy conservation;Splinting;Taping;Passive range of motion;Scar mobilization;Compression bandaging;Orthotic Fit/Training;Spinal Manipulations;Joint Manipulations    PT Next  Visit Plan Continue with focus on  improving gait (which is most likely part of her increased pain), mm strengthening of both hip and knee as well as balance.    PT Home Exercise Plan 12/16 LAQ, quad set, heel slides 12/21 squat, SLR 05/26/20: heel raise, hip abd/ ext, single leg balance    Consulted and Agree with Plan of Care Patient           Patient will benefit from skilled therapeutic intervention in order to improve the following deficits and impairments:  Abnormal gait,Decreased range of motion,Difficulty walking,Pain,Increased muscle spasms,Decreased endurance,Decreased activity tolerance,Decreased balance,Impaired flexibility,Improper body mechanics,Decreased strength,Decreased mobility  Visit Diagnosis: Right knee pain, unspecified chronicity  Muscle weakness (generalized)  Other abnormalities of gait and mobility  Other symptoms and signs involving the musculoskeletal system     Problem List Patient Active Problem List   Diagnosis Date Noted  . S/P right knee arthroscopy for chondroplasty patella 07/23/19  07/30/2019  . Microcytic anemia 07/09/2019  . Lumbar degenerative disc disease 09/22/2014  . Lumbar herniated disc 06/11/2014  . Chronic pain 05/14/2013  . HNP (herniated nucleus pulposus), lumbar 05/14/2013  . Leg weakness, bilateral 04/21/2013  . Radicular pain of left lower extremity 03/31/2013  . Retrolisthesis 03/31/2013  . Effusion of knee joint 10/21/2012  . Status post knee surgery 10/21/2012  . 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  . Patellofemoral instability with pain 05/22/2012  . Dislocation of patella 01/16/2012  . Knee pain, bilateral 01/16/2012  . Difficulty in walking 10/31/2011  . Pain in right knee 10/31/2011  . Weakness of right leg 10/31/2011  . Knee pain 09/11/2011  . Stiffness of joint, not elsewhere classified, lower leg 09/11/2011  . Gait  abnormality 09/11/2011  . Muscle weakness (generalized) 09/11/2011  . Knee instability 07/12/2011  . 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 Patellar instability 09/14/2010  . HEMARTHROSIS 04/04/2010  . SUBLUXATION PATELLAR (MALALIGNMENT) 05/04/2009  . 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 [place], [place], [address] Phone: [phone]   Fax:  [phone]  Name: Karen Mercer MRN: [id number] Date of Birth: 01-15-1971

## 2020-06-02 ENCOUNTER — Ambulatory Visit (HOSPITAL_COMMUNITY): Payer: Medicare HMO | Admitting: Physical Therapy

## 2020-06-02 ENCOUNTER — Other Ambulatory Visit: Payer: Self-pay

## 2020-06-02 ENCOUNTER — Encounter (HOSPITAL_COMMUNITY): Payer: Self-pay | Admitting: Physical Therapy

## 2020-06-02 DIAGNOSIS — M25661 Stiffness of right knee, not elsewhere classified: Secondary | ICD-10-CM

## 2020-06-02 DIAGNOSIS — R29898 Other symptoms and signs involving the musculoskeletal system: Secondary | ICD-10-CM

## 2020-06-02 DIAGNOSIS — R2689 Other abnormalities of gait and mobility: Secondary | ICD-10-CM

## 2020-06-02 DIAGNOSIS — M6281 Muscle weakness (generalized): Secondary | ICD-10-CM

## 2020-06-02 DIAGNOSIS — M25561 Pain in right knee: Secondary | ICD-10-CM | POA: Diagnosis not present

## 2020-06-02 NOTE — Therapy (Addendum)
[place], [place], [address] Phone: [phone]   Fax:  [phone]  Physical Therapy Treatment/ Progress note/ Recert  Patient Details  Name: Karen Mercer MRN: [id number] Date of Birth: 12-Jun-1970 Referring Provider (PT): Mayer Camel   Encounter Date: 06/02/2020   Progress Note   Reporting Period 04/21/21 to 06/02/20   See note below for Objective Data and Assessment of Progress/Goals   PT End of Session - 06/02/20 1131    Visit Number 8    Number of Visits 20    Date for PT Re-Evaluation 06/30/20    Authorization Type Primary [employer] Secondary Medicaid    Authorization Time Period 04/21/20-06/02/20; 8 visits requested 06/02/20-06/30/20 - check auth    Authorization - Visit Number 8    Authorization - Number of Visits 12    Progress Note Due on Visit 18    PT Start Time 6962    PT Stop Time 1210    PT Time Calculation (min) 39 min    Activity Tolerance Patient tolerated treatment well;Patient limited by pain    Behavior During Therapy [facility] for tasks assessed/performed           Past Medical History:  Diagnosis Date  . Chronic back pain    both legs;DDD and stenosis   . Complication of anesthesia    had extreme headache after last back surgery - was in pacu for extended period  . Muscle spasm    takes Valium daily as needed  . Neuromuscular disorder ([place])   . PONV (postoperative nausea and vomiting)   . Rheumatoid arthritis [facility])     Past Surgical History:  Procedure Laterality Date  . BACK SURGERY     at surgical center  . CHONDROPLASTY Right 07/23/2019   Procedure: CHONDROPLASTY RIGHT PATELLA  AND LATERAL FEMUR;  Surgeon: Carole Civil, MD;  Location: [facility];  Service: Orthopedics;  Laterality: Right;  . KNEE ARTHROSCOPY  08/17/2011   Procedure: ARTHROSCOPY KNEE;  Surgeon: Carole Civil, MD;  Location: [facility];  Service: Orthopedics;  Laterality: Right;  with tibia tubercle  transfer and proximal realignment  . KNEE ARTHROSCOPY Right 07/23/2019   Procedure: ARTHROSCOPY RIGHT KNEE;  Surgeon: Carole Civil, MD;  Location: [facility];  Service: Orthopedics;  Laterality: Right;  . KNEE ARTHROSCOPY WITH FULKERSON SLIDE Left 08/29/2012   Procedure: Arthroscopic lateral release with chondroplasty left knee;  Surgeon: Carole Civil, MD;  Location: [facility];  Service: Orthopedics;  Laterality: Left;  . KNEE ARTHROSCOPY WITH MEDIAL PATELLAR FEMORAL LIGAMENT RECONSTRUCTION Left 08/29/2012   Procedure: Open proximal realignment fulkerson;  Surgeon: Carole Civil, MD;  Location: [facility];  Service: Orthopedics;  Laterality: Left;  . KNEE SURGERY  2012   right knee-arthroscopy  . LUMBAR LAMINECTOMY/DECOMPRESSION MICRODISCECTOMY Right 06/11/2014   Procedure: Right L4-5 Lumbar diskectomy;  Surgeon: Floyce Stakes, MD;  Location: [place] [facility];  Service: Neurosurgery;  Laterality: Right;  Right L4-5 Lumbar diskectomy  . right arm  2008   rods from fractured arm  . TUBAL LIGATION  1995    There were no vitals filed for this visit.   Subjective Assessment - 06/02/20 1132    Subjective Patient states her home exercises are going well. Patient states 65% improvement with physical therapy intervention. Patient states her balance  feels off still and she has trouble getting up from sitting.    Currently in Pain? Yes    Pain Score 5  Pain Location Knee    Pain Orientation Right    Pain Descriptors / Indicators Aching    Pain Type Surgical pain    Pain Onset More than a month ago    Pain Frequency Intermittent              [facility] PT Assessment - 06/02/20 0001      Assessment   Medical Diagnosis R TKA    Referring Provider (PT) Mayer Camel    Onset Date/Surgical Date 02/26/20    Next MD Visit Tomorrow    Prior Therapy Yes      Precautions   Precautions None      Restrictions   Weight Bearing Restrictions No      Prior Function   Level of Independence  Independent      Cognition   Overall Cognitive Status Within Functional Limits for tasks assessed      Observation/Other Assessments   Observations ambulates without AD with minimal antalgic gait immediatly following sitting due to stiffness    Focus on Therapeutic Outcomes (FOTO)  59% function      AROM   Right Knee Extension 0    Right Knee Flexion 128      Strength   Right Knee Flexion 4+/5    Right Knee Extension 5/5    Right Ankle Dorsiflexion 5/5      Ambulation/Gait   Ambulation/Gait Yes    Ambulation/Gait Assistance 7: Independent    Ambulation Distance (Feet) 360 Feet    Assistive device None    Gait Pattern Decreased hip/knee flexion - right;Antalgic    Gait velocity decreased    Gait Comments 2MWT; stairs able to navigate with alternating pattern but decreased eccentric strength and control noted on RLE                         [facility] Adult PT Treatment/Exercise - 06/02/20 0001      Knee/Hip Exercises: Aerobic   Recumbent Bike 5 minutes dynamic warm up      Knee/Hip Exercises: Standing   Heel Raises Both;20 reps    Heel Raises Limitations on edge of step    Knee Flexion 2 sets;15 reps;Right    Knee Flexion Limitations 5#    Step Down Right;2 sets;10 reps;Hand Hold: 1;Step Height: 4"    Step Down Limitations eccentric lowering    SLS with Vectors 5x with mini squat 2 sets    Other Standing Knee Exercises hip hike 3x 10 on edge of step      Knee/Hip Exercises: Supine   Knee Extension AROM    Knee Extension Limitations 0    Knee Flexion AROM    Knee Flexion Limitations 128                  PT Education - 06/02/20 1132    Education Details Patient educated on HEP, exercise mechanics, POC, progress made    Person(s) Educated Patient    Methods Explanation;Demonstration    Comprehension Verbalized understanding;Returned demonstration            PT Short Term Goals - 06/02/20 1155      PT SHORT TERM GOAL #1   Title Patient will  be independent with HEP in order to improve functional outcomes.    Time 3    Period Weeks    Status Achieved    Target Date 05/12/20      PT SHORT TERM GOAL #2   Title Patient  will report at least 25% improvement in symptoms for improved quality of life.    Time 3    Period Weeks    Status Achieved    Target Date 05/12/20             PT Long Term Goals - 06/02/20 1155      PT LONG TERM GOAL #1   Title Patient will report at least 75% improvement in symptoms for improved quality of life.    Time 6    Period Weeks    Status On-going      PT LONG TERM GOAL #2   Title Patient will improve FOTO score by at least 20 points in order to indicate improved tolerance to activity.    Time 6    Period Weeks    Status Achieved      PT LONG TERM GOAL #3   Title Patient will be able to navigate stairs with reciprocal pattern without compensation in order to demonstrate improved LE strength.    Time 6    Period Weeks    Status On-going      PT LONG TERM GOAL #4   Title Patient will be able to ambulate at least 450 feet in 2MWT in order to demonstrate improved gait speed for community ambulation.    Time 6    Period Weeks    Status On-going                 Plan - 06/02/20 1132    Clinical Impression Statement Patient has met 2/2 short term goals and  long term goals with ability to complete HEP and improvement in symptoms and activity tolerance. Patient continues to remain limited by symptoms, strength, gait, stairs, and functional mobility. Patient continues to ambulate with antalgic gait pattern with L truncal lean with limited knee ROM. Minimal improvement with cueing for mechanics. Added hip hike for improved hip abductor strengthening. She shows improving strength overall but continues to lack eccentric quad and hip strength as evidence by performance on stairs/step exercises. Patient will continue to benefit from skilled physical therapy in order to reduce impairment and  improve function.    Personal Factors and Comorbidities Fitness;Behavior Pattern;Past/Current Experience;Comorbidity 3+;Time since onset of injury/illness/exacerbation    Comorbidities : Arthritis, Back pain, Osteoporosis, chronic knee pain    Examination-Activity Limitations Locomotion Level;Transfers;Bend;Stand;Stairs;Squat;Lift;Carry    Examination-Participation Restrictions Church;Meal Prep;Cleaning;Yard Work;Volunteer;Shop    Stability/Clinical Decision Making Stable/Uncomplicated    Rehab Potential Good    PT Frequency 2x / week    PT Duration 6 weeks    PT Treatment/Interventions ADLs/Self Care Home Management;Aquatic Therapy;Cryotherapy;Scientist, product/process development;Iontophoresis 9m/ml Dexamethasone;Moist Heat;Traction;Contrast Bath;Gait training;Stair training;Functional mobility training;Therapeutic activities;Therapeutic exercise;Balance training;Neuromuscular re-education;Patient/family education;Manual techniques;Dry needling;Energy conservation;Splinting;Taping;Passive range of motion;Scar mobilization;Compression bandaging;Orthotic Fit/Training;Spinal Manipulations;Joint Manipulations    PT Next Visit Plan Continue with focus on  improving gait (which is most likely part of her increased pain), mm strengthening of both hip and knee as well as balance.    PT Home Exercise Plan 12/16 LAQ, quad set, heel slides 12/21 squat, SLR 05/26/20: heel raise, hip abd/ ext, single leg balance    Consulted and Agree with Plan of Care Patient           Patient will benefit from skilled therapeutic intervention in order to improve the following deficits and impairments:  Abnormal gait,Decreased range of motion,Difficulty walking,Pain,Increased muscle spasms,Decreased endurance,Decreased activity tolerance,Decreased balance,Impaired flexibility,Improper body mechanics,Decreased strength,Decreased mobility  Visit Diagnosis: Right knee pain, unspecified chronicity  Muscle  weakness  (generalized)  Other abnormalities of gait and mobility  Other symptoms and signs involving the musculoskeletal system  Stiffness of right knee, not elsewhere classified     Problem List Patient Active Problem List   Diagnosis Date Noted  . S/P right knee arthroscopy for chondroplasty patella 07/23/19  07/30/2019  . Microcytic anemia 07/09/2019  . Lumbar degenerative disc disease 09/22/2014  . Lumbar herniated disc 06/11/2014  . Chronic pain 05/14/2013  . HNP (herniated nucleus pulposus), lumbar 05/14/2013  . Leg weakness, bilateral 04/21/2013  . Radicular pain of left lower extremity 03/31/2013  . Retrolisthesis 03/31/2013  . Effusion of knee joint 10/21/2012  . Status post knee surgery 10/21/2012  . 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  . Patellofemoral instability with pain 05/22/2012  . Dislocation of patella 01/16/2012  . Knee pain, bilateral 01/16/2012  . Difficulty in walking 10/31/2011  . Pain in right knee 10/31/2011  . Weakness of right leg 10/31/2011  . Knee pain 09/11/2011  . Stiffness of joint, not elsewhere classified, lower leg 09/11/2011  . Gait abnormality 09/11/2011  . Muscle weakness (generalized) 09/11/2011  . Knee instability 07/12/2011  . 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 Patellar instability 09/14/2010  . HEMARTHROSIS 04/04/2010  . SUBLUXATION PATELLAR (MALALIGNMENT) 05/04/2009  . 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    Vianne Bulls Bulah Lurie 06/02/2020, 12:22 PM  [place] [address] [place], [place], [address] Phone: [phone]   Fax:  [phone]  Name: BRIEANNE MIGNONE MRN: [id number] Date of Birth: 1970-06-26

## 2020-06-07 ENCOUNTER — Telehealth (HOSPITAL_COMMUNITY): Payer: Self-pay | Admitting: Physical Therapy

## 2020-06-07 ENCOUNTER — Ambulatory Visit (HOSPITAL_COMMUNITY): Payer: Medicare HMO | Admitting: Physical Therapy

## 2020-06-07 NOTE — Telephone Encounter (Signed)
Pt called to cx and her knee is really hurting and she will not be here today.

## 2020-06-09 ENCOUNTER — Other Ambulatory Visit: Payer: Self-pay

## 2020-06-09 ENCOUNTER — Ambulatory Visit (HOSPITAL_COMMUNITY): Payer: Medicare HMO | Attending: Orthopedic Surgery | Admitting: Physical Therapy

## 2020-06-09 ENCOUNTER — Encounter (HOSPITAL_COMMUNITY): Payer: Self-pay | Admitting: Physical Therapy

## 2020-06-09 DIAGNOSIS — R29898 Other symptoms and signs involving the musculoskeletal system: Secondary | ICD-10-CM | POA: Insufficient documentation

## 2020-06-09 DIAGNOSIS — R2689 Other abnormalities of gait and mobility: Secondary | ICD-10-CM | POA: Diagnosis present

## 2020-06-09 DIAGNOSIS — M25561 Pain in right knee: Secondary | ICD-10-CM | POA: Diagnosis not present

## 2020-06-09 DIAGNOSIS — M6281 Muscle weakness (generalized): Secondary | ICD-10-CM | POA: Insufficient documentation

## 2020-06-09 DIAGNOSIS — M25661 Stiffness of right knee, not elsewhere classified: Secondary | ICD-10-CM | POA: Insufficient documentation

## 2020-06-09 NOTE — Therapy (Signed)
[place], [place], [address] Phone: [phone]   Fax:  [phone]  Physical Therapy Treatment  Patient Details  Name: Karen Mercer MRN: [id number] Date of Birth: 03-22-1971 Referring Provider (PT): Mayer Camel   Encounter Date: 06/09/2020   PT End of Session - 06/09/20 1530    Visit Number 9    Number of Visits 20    Date for PT Re-Evaluation 06/30/20    Authorization Type Primary [employer] Secondary Medicaid    Authorization Time Period 04/21/20-06/02/20; 8 visits approved 06/02/20-06/30/20    Authorization - Visit Number 1    Authorization - Number of Visits 8    Progress Note Due on Visit 18    PT Start Time 6962    PT Stop Time 1610    PT Time Calculation (min) 40 min    Activity Tolerance Patient tolerated treatment well;Patient limited by pain    Behavior During Therapy [facility] for tasks assessed/performed           Past Medical History:  Diagnosis Date  . Chronic back pain    both legs;DDD and stenosis   . Complication of anesthesia    had extreme headache after last back surgery - was in pacu for extended period  . Muscle spasm    takes Valium daily as needed  . Neuromuscular disorder ([place])   . PONV (postoperative nausea and vomiting)   . Rheumatoid arthritis [facility])     Past Surgical History:  Procedure Laterality Date  . BACK SURGERY     at surgical center  . CHONDROPLASTY Right 07/23/2019   Procedure: CHONDROPLASTY RIGHT PATELLA  AND LATERAL FEMUR;  Surgeon: Carole Civil, MD;  Location: [facility];  Service: Orthopedics;  Laterality: Right;  . KNEE ARTHROSCOPY  08/17/2011   Procedure: ARTHROSCOPY KNEE;  Surgeon: Carole Civil, MD;  Location: [facility];  Service: Orthopedics;  Laterality: Right;  with tibia tubercle transfer and proximal realignment  . KNEE ARTHROSCOPY Right 07/23/2019   Procedure: ARTHROSCOPY RIGHT KNEE;  Surgeon: Carole Civil, MD;  Location: [facility];   Service: Orthopedics;  Laterality: Right;  . KNEE ARTHROSCOPY WITH FULKERSON SLIDE Left 08/29/2012   Procedure: Arthroscopic lateral release with chondroplasty left knee;  Surgeon: Carole Civil, MD;  Location: [facility];  Service: Orthopedics;  Laterality: Left;  . KNEE ARTHROSCOPY WITH MEDIAL PATELLAR FEMORAL LIGAMENT RECONSTRUCTION Left 08/29/2012   Procedure: Open proximal realignment fulkerson;  Surgeon: Carole Civil, MD;  Location: [facility];  Service: Orthopedics;  Laterality: Left;  . KNEE SURGERY  2012   right knee-arthroscopy  . LUMBAR LAMINECTOMY/DECOMPRESSION MICRODISCECTOMY Right 06/11/2014   Procedure: Right L4-5 Lumbar diskectomy;  Surgeon: Floyce Stakes, MD;  Location: [place] [facility];  Service: Neurosurgery;  Laterality: Right;  Right L4-5 Lumbar diskectomy  . right arm  2008   rods from fractured arm  . TUBAL LIGATION  1995    There were no vitals filed for this visit.   Subjective Assessment - 06/09/20 1532    Subjective Patient states her knee was really sore and aching the other day. She is doing better today. Still doing her home exericses. She didnt do anything different.    Patient Stated Goals to get back to moving on her own    Currently in Pain? Yes    Pain Score 5     Pain Location Knee    Pain Orientation Right    Pain Descriptors / Indicators  Aching    Pain Type Surgical pain    Pain Onset More than a month ago                             [facility] Adult PT Treatment/Exercise - 06/09/20 0001      Ambulation/Gait   Ambulation/Gait Yes    Ambulation/Gait Assistance 7: Independent    Ambulation Distance (Feet) 450 Feet    Assistive device None    Gait Pattern Decreased hip/knee flexion - right;Antalgic      Knee/Hip Exercises: Stretches   Gastroc Stretch Both;3 reps;30 seconds    Gastroc Stretch Limitations slant board      Knee/Hip Exercises: Aerobic   Recumbent Bike 5 minutes dynamic warm up level 3 for cardiovascular fitness and  endurance      Knee/Hip Exercises: Machines for Information systems manager walkouts 40# x10      Knee/Hip Exercises: Standing   Heel Raises Both;20 reps    Heel Raises Limitations on edge of step    Forward Lunges Both;2 sets;10 reps    Lateral Step Up Right;2 sets;10 reps;Hand Hold: 1;Step Height: 6"    Lateral Step Up Limitations eccentric lowering    Step Down Right;2 sets;10 reps;Hand Hold: 1;Step Height: 6"    Step Down Limitations eccentric lowering    Other Standing Knee Exercises hip slide outs 3x 10 bilateral                  PT Education - 06/09/20 1532    Education Details Patient educated on HEP, exercise mechanics    Person(s) Educated Patient    Methods Explanation;Demonstration    Comprehension Verbalized understanding;Returned demonstration            PT Short Term Goals - 06/02/20 1155      PT SHORT TERM GOAL #1   Title Patient will be independent with HEP in order to improve functional outcomes.    Time 3    Period Weeks    Status Achieved    Target Date 05/12/20      PT SHORT TERM GOAL #2   Title Patient will report at least 25% improvement in symptoms for improved quality of life.    Time 3    Period Weeks    Status Achieved    Target Date 05/12/20             PT Long Term Goals - 06/02/20 1155      PT LONG TERM GOAL #1   Title Patient will report at least 75% improvement in symptoms for improved quality of life.    Time 6    Period Weeks    Status On-going      PT LONG TERM GOAL #2   Title Patient will improve FOTO score by at least 20 points in order to indicate improved tolerance to activity.    Time 6    Period Weeks    Status Achieved      PT LONG TERM GOAL #3   Title Patient will be able to navigate stairs with reciprocal pattern without compensation in order to demonstrate improved LE strength.    Time 6    Period Weeks    Status On-going      PT LONG TERM GOAL #4   Title Patient will be able to  ambulate at least 450 feet in 2MWT in order to demonstrate improved gait speed for community ambulation.  Time 6    Period Weeks    Status On-going                 Plan - 06/09/20 1532    Clinical Impression Statement Patient showing improving but limited eccentric control with stair exercises. She continues to ambulate with antalgic gait lacking TKE despite having TKE ROM. Patient given cueing for mechanics with forward step down due to tendency to compensate with fair carry over but is limited due to weakness. Patient compensates with fatigue with momentum with hip slide out exercise. Patient requires cueing for step distance and LE mechanics with resisted retro gait. Patient will continue to benefit from skilled physical therapy in order to reduce impairment and improve function.    Personal Factors and Comorbidities Fitness;Behavior Pattern;Past/Current Experience;Comorbidity 3+;Time since onset of injury/illness/exacerbation    Comorbidities : Arthritis, Back pain, Osteoporosis, chronic knee pain    Examination-Activity Limitations Locomotion Level;Transfers;Bend;Stand;Stairs;Squat;Lift;Carry    Examination-Participation Restrictions Church;Meal Prep;Cleaning;Yard Work;Volunteer;Shop    Stability/Clinical Decision Making Stable/Uncomplicated    Rehab Potential Good    PT Frequency 2x / week    PT Duration 6 weeks    PT Treatment/Interventions ADLs/Self Care Home Management;Aquatic Therapy;Cryotherapy;Scientist, product/process development;Iontophoresis 25m/ml Dexamethasone;Moist Heat;Traction;Contrast Bath;Gait training;Stair training;Functional mobility training;Therapeutic activities;Therapeutic exercise;Balance training;Neuromuscular re-education;Patient/family education;Manual techniques;Dry needling;Energy conservation;Splinting;Taping;Passive range of motion;Scar mobilization;Compression bandaging;Orthotic Fit/Training;Spinal Manipulations;Joint Manipulations    PT Next Visit  Plan Continue with focus on  improving gait (which is most likely part of her increased pain), mm strengthening of both hip and knee as well as balance.    PT Home Exercise Plan 12/16 LAQ, quad set, heel slides 12/21 squat, SLR 05/26/20: heel raise, hip abd/ ext, single leg balance 2/3 hip slide outs    Consulted and Agree with Plan of Care Patient           Patient will benefit from skilled therapeutic intervention in order to improve the following deficits and impairments:  Abnormal gait,Decreased range of motion,Difficulty walking,Pain,Increased muscle spasms,Decreased endurance,Decreased activity tolerance,Decreased balance,Impaired flexibility,Improper body mechanics,Decreased strength,Decreased mobility  Visit Diagnosis: Right knee pain, unspecified chronicity  Muscle weakness (generalized)  Other abnormalities of gait and mobility  Other symptoms and signs involving the musculoskeletal system  Stiffness of right knee, not elsewhere classified     Problem List Patient Active Problem List   Diagnosis Date Noted  . S/P right knee arthroscopy for chondroplasty patella 07/23/19  07/30/2019  . Microcytic anemia 07/09/2019  . Lumbar degenerative disc disease 09/22/2014  . Lumbar herniated disc 06/11/2014  . Chronic pain 05/14/2013  . HNP (herniated nucleus pulposus), lumbar 05/14/2013  . Leg weakness, bilateral 04/21/2013  . Radicular pain of left lower extremity 03/31/2013  . Retrolisthesis 03/31/2013  . Effusion of knee joint 10/21/2012  . Status post knee surgery 10/21/2012  . 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  . Patellofemoral instability with pain 05/22/2012  . Dislocation of patella 01/16/2012  . Knee pain, bilateral 01/16/2012  . Difficulty in walking 10/31/2011  . Pain in right knee 10/31/2011  . Weakness of right leg 10/31/2011  . Knee pain 09/11/2011  . Stiffness of  joint, not elsewhere classified, lower leg 09/11/2011  . Gait abnormality 09/11/2011  . Muscle weakness (generalized) 09/11/2011  . Knee instability 07/12/2011  . 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 Patellar instability 09/14/2010  . HEMARTHROSIS 04/04/2010  . SUBLUXATION PATELLAR (MALALIGNMENT) 05/04/2009  .  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

## 2020-06-14 ENCOUNTER — Encounter (HOSPITAL_COMMUNITY): Payer: Self-pay | Admitting: Physical Therapy

## 2020-06-14 ENCOUNTER — Ambulatory Visit (HOSPITAL_COMMUNITY): Payer: Medicare HMO | Admitting: Physical Therapy

## 2020-06-14 ENCOUNTER — Other Ambulatory Visit: Payer: Self-pay

## 2020-06-14 DIAGNOSIS — M25561 Pain in right knee: Secondary | ICD-10-CM | POA: Diagnosis not present

## 2020-06-14 DIAGNOSIS — R2689 Other abnormalities of gait and mobility: Secondary | ICD-10-CM

## 2020-06-14 DIAGNOSIS — M25661 Stiffness of right knee, not elsewhere classified: Secondary | ICD-10-CM

## 2020-06-14 DIAGNOSIS — M6281 Muscle weakness (generalized): Secondary | ICD-10-CM

## 2020-06-14 DIAGNOSIS — R29898 Other symptoms and signs involving the musculoskeletal system: Secondary | ICD-10-CM

## 2020-06-14 NOTE — Therapy (Signed)
[place], [place], [address] Phone: [phone]   Fax:  [phone]  Physical Therapy Treatment  Patient Details  Name: Karen Mercer MRN: [id number] Date of Birth: 12-Jun-1970 Referring Provider (PT): Mayer Camel   Encounter Date: 06/14/2020   PT End of Session - 06/14/20 1314    Visit Number 10    Number of Visits 20    Date for PT Re-Evaluation 06/30/20    Authorization Type Primary [employer] Medicare Secondary Medicaid    Authorization Time Period 04/21/20-06/02/20; 8 visits approved 06/02/20-06/30/20    Authorization - Visit Number 2    Authorization - Number of Visits 8    Progress Note Due on Visit 18    PT Start Time 1315    PT Stop Time 1355    PT Time Calculation (min) 40 min    Activity Tolerance Patient tolerated treatment well    Behavior During Therapy [facility] for tasks assessed/performed           Past Medical History:  Diagnosis Date  . Chronic back pain    both legs;DDD and stenosis   . Complication of anesthesia    had extreme headache after last back surgery - was in pacu for extended period  . Muscle spasm    takes Valium daily as needed  . Neuromuscular disorder ([place])   . PONV (postoperative nausea and vomiting)   . Rheumatoid arthritis [facility])     Past Surgical History:  Procedure Laterality Date  . BACK SURGERY     at surgical center  . CHONDROPLASTY Right 07/23/2019   Procedure: CHONDROPLASTY RIGHT PATELLA  AND LATERAL FEMUR;  Surgeon: Carole Civil, MD;  Location: [facility];  Service: Orthopedics;  Laterality: Right;  . KNEE ARTHROSCOPY  08/17/2011   Procedure: ARTHROSCOPY KNEE;  Surgeon: Carole Civil, MD;  Location: [facility];  Service: Orthopedics;  Laterality: Right;  with tibia tubercle transfer and proximal realignment  . KNEE ARTHROSCOPY Right 07/23/2019   Procedure: ARTHROSCOPY RIGHT KNEE;  Surgeon: Carole Civil, MD;  Location: [facility];  Service: Orthopedics;   Laterality: Right;  . KNEE ARTHROSCOPY WITH FULKERSON SLIDE Left 08/29/2012   Procedure: Arthroscopic lateral release with chondroplasty left knee;  Surgeon: Carole Civil, MD;  Location: [facility];  Service: Orthopedics;  Laterality: Left;  . KNEE ARTHROSCOPY WITH MEDIAL PATELLAR FEMORAL LIGAMENT RECONSTRUCTION Left 08/29/2012   Procedure: Open proximal realignment fulkerson;  Surgeon: Carole Civil, MD;  Location: [facility];  Service: Orthopedics;  Laterality: Left;  . KNEE SURGERY  2012   right knee-arthroscopy  . LUMBAR LAMINECTOMY/DECOMPRESSION MICRODISCECTOMY Right 06/11/2014   Procedure: Right L4-5 Lumbar diskectomy;  Surgeon: Floyce Stakes, MD;  Location: [place] [facility];  Service: Neurosurgery;  Laterality: Right;  Right L4-5 Lumbar diskectomy  . right arm  2008   rods from fractured arm  . TUBAL LIGATION  1995    There were no vitals filed for this visit.   Subjective Assessment - 06/14/20 1315    Subjective Patient states her knee is doing good. Her home exercises are going well. She continues to have stiffness after sitting.    Patient Stated Goals to get back to moving on her own    Currently in Pain? No/denies    Pain Onset More than a month ago  [facility] Adult PT Treatment/Exercise - 06/14/20 0001      Ambulation/Gait   Ambulation/Gait Yes    Ambulation/Gait Assistance 7: Independent    Ambulation Distance (Feet) 450 Feet    Assistive device None    Gait Comments cueing for mechanics, and heel toe gait      Knee/Hip Exercises: Aerobic   Recumbent Bike 5 minutes dynamic warm up level 3 for cardiovascular fitness and endurance      Knee/Hip Exercises: Machines for Strengthening   Cybex Leg Press 3 plates 2 x 10    Other Machine machine walkouts 50#  x10      Knee/Hip Exercises: Standing   Heel Raises Both;20 reps    Heel Raises Limitations on edge of step    Knee Flexion 2 sets;15 reps;Both    Knee Flexion Limitations  5#    Lateral Step Up Right;2 sets;10 reps;Hand Hold: 1;Step Height: 6"    Lateral Step Up Limitations eccentric lowering    Forward Step Up 15 reps;Hand Hold: 1;Step Height: 6";Right;2 sets    Forward Step Up Limitations with contralateral knee drive    Step Down Right;2 sets;10 reps;Hand Hold: 1;Step Height: 6"    Step Down Limitations eccentric lowering    Other Standing Knee Exercises hip slide outs 3x 15 bilateral                  PT Education - 06/14/20 1315    Education Details Patient educated on HEP, exercise mechanics    Person(s) Educated Patient    Methods Explanation;Demonstration    Comprehension Verbalized understanding;Returned demonstration            PT Short Term Goals - 06/02/20 1155      PT SHORT TERM GOAL #1   Title Patient will be independent with HEP in order to improve functional outcomes.    Time 3    Period Weeks    Status Achieved    Target Date 05/12/20      PT SHORT TERM GOAL #2   Title Patient will report at least 25% improvement in symptoms for improved quality of life.    Time 3    Period Weeks    Status Achieved    Target Date 05/12/20             PT Long Term Goals - 06/02/20 1155      PT LONG TERM GOAL #1   Title Patient will report at least 75% improvement in symptoms for improved quality of life.    Time 6    Period Weeks    Status On-going      PT LONG TERM GOAL #2   Title Patient will improve FOTO score by at least 20 points in order to indicate improved tolerance to activity.    Time 6    Period Weeks    Status Achieved      PT LONG TERM GOAL #3   Title Patient will be able to navigate stairs with reciprocal pattern without compensation in order to demonstrate improved LE strength.    Time 6    Period Weeks    Status On-going      PT LONG TERM GOAL #4   Title Patient will be able to ambulate at least 450 feet in 2MWT in order to demonstrate improved gait speed for community ambulation.    Time 6     Period Weeks    Status On-going  Plan - 06/14/20 1314    Clinical Impression Statement Began session with bike for dynamic warm up. Patient completes step up with contralateral knee drive lacking TKE requiring unilateral UE support for balance. Patient completes stair exercises from stairs vs. step today quickly is slightly higher. She continues to show improving eccentric strength and motor but requires increased UE support with fatigue. Patient with fatigue noted at end of session. Patient will continue to benefit from skilled physical therapy in order to reduce impairment and improve function.    Personal Factors and Comorbidities Fitness;Behavior Pattern;Past/Current Experience;Comorbidity 3+;Time since onset of injury/illness/exacerbation    Comorbidities : Arthritis, Back pain, Osteoporosis, chronic knee pain    Examination-Activity Limitations Locomotion Level;Transfers;Bend;Stand;Stairs;Squat;Lift;Carry    Examination-Participation Restrictions Church;Meal Prep;Cleaning;Yard Work;Volunteer;Shop    Stability/Clinical Decision Making Stable/Uncomplicated    Rehab Potential Good    PT Frequency 2x / week    PT Duration 6 weeks    PT Treatment/Interventions ADLs/Self Care Home Management;Aquatic Therapy;Cryotherapy;Scientist, product/process development;Iontophoresis 44m/ml Dexamethasone;Moist Heat;Traction;Contrast Bath;Gait training;Stair training;Functional mobility training;Therapeutic activities;Therapeutic exercise;Balance training;Neuromuscular re-education;Patient/family education;Manual techniques;Dry needling;Energy conservation;Splinting;Taping;Passive range of motion;Scar mobilization;Compression bandaging;Orthotic Fit/Training;Spinal Manipulations;Joint Manipulations    PT Next Visit Plan Continue with focus on  improving gait (which is most likely part of her increased pain), mm strengthening of both hip and knee as well as balance.    PT Home Exercise Plan  12/16 LAQ, quad set, heel slides 12/21 squat, SLR 05/26/20: heel raise, hip abd/ ext, single leg balance 2/3 hip slide outs    Consulted and Agree with Plan of Care Patient           Patient will benefit from skilled therapeutic intervention in order to improve the following deficits and impairments:  Abnormal gait,Decreased range of motion,Difficulty walking,Pain,Increased muscle spasms,Decreased endurance,Decreased activity tolerance,Decreased balance,Impaired flexibility,Improper body mechanics,Decreased strength,Decreased mobility  Visit Diagnosis: Right knee pain, unspecified chronicity  Muscle weakness (generalized)  Other abnormalities of gait and mobility  Other symptoms and signs involving the musculoskeletal system  Stiffness of right knee, not elsewhere classified     Problem List Patient Active Problem List   Diagnosis Date Noted  . S/P right knee arthroscopy for chondroplasty patella 07/23/19  07/30/2019  . Microcytic anemia 07/09/2019  . Lumbar degenerative disc disease 09/22/2014  . Lumbar herniated disc 06/11/2014  . Chronic pain 05/14/2013  . HNP (herniated nucleus pulposus), lumbar 05/14/2013  . Leg weakness, bilateral 04/21/2013  . Radicular pain of left lower extremity 03/31/2013  . Retrolisthesis 03/31/2013  . Effusion of knee joint 10/21/2012  . Status post knee surgery 10/21/2012  . 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  . Patellofemoral instability with pain 05/22/2012  . Dislocation of patella 01/16/2012  . Knee pain, bilateral 01/16/2012  . Difficulty in walking 10/31/2011  . Pain in right knee 10/31/2011  . Weakness of right leg 10/31/2011  . Knee pain 09/11/2011  . Stiffness of joint, not elsewhere classified, lower leg 09/11/2011  . Gait abnormality 09/11/2011  . Muscle weakness (generalized) 09/11/2011  . Knee instability 07/12/2011  . Recurrent  dislocation of right patella 10/19/2010  . 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 Patellar instability 09/14/2010  . HEMARTHROSIS 04/04/2010  . SUBLUXATION PATELLAR (MALALIGNMENT) 05/04/2009  . 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    1:53 PM, 06/14/20 AMearl LatinPT, DPT Physical Therapist [place]  [place], [address] Phone: [phone]   Fax:  [phone]  Name: TRICIA PLEDGER MRN: [id number] Date of Birth: [birth year]

## 2020-06-16 ENCOUNTER — Other Ambulatory Visit: Payer: Self-pay

## 2020-06-16 ENCOUNTER — Encounter (HOSPITAL_COMMUNITY): Payer: Self-pay | Admitting: Physical Therapy

## 2020-06-16 ENCOUNTER — Ambulatory Visit (HOSPITAL_COMMUNITY): Payer: Medicare HMO | Admitting: Physical Therapy

## 2020-06-16 DIAGNOSIS — M25661 Stiffness of right knee, not elsewhere classified: Secondary | ICD-10-CM

## 2020-06-16 DIAGNOSIS — R2689 Other abnormalities of gait and mobility: Secondary | ICD-10-CM

## 2020-06-16 DIAGNOSIS — M25561 Pain in right knee: Secondary | ICD-10-CM

## 2020-06-16 DIAGNOSIS — R29898 Other symptoms and signs involving the musculoskeletal system: Secondary | ICD-10-CM

## 2020-06-16 DIAGNOSIS — M6281 Muscle weakness (generalized): Secondary | ICD-10-CM

## 2020-06-16 NOTE — Therapy (Signed)
[facility] [address] [place], [place], [address] Phone: [phone]   Fax:  [phone]  Physical Therapy Treatment/ Discharge Summary  Patient Details  Name: Karen Mercer MRN: [id number] Date of Birth: Dec 10, 1970 Referring Provider (PT): Mayer Camel   Encounter Date: 06/16/2020   PHYSICAL THERAPY DISCHARGE SUMMARY  Visits from Start of Care: 11  Current functional level related to goals / functional outcomes: See below   Remaining deficits: See below   Education / Equipment: See below  Plan: Patient agrees to discharge.  Patient goals were partially met. Patient is being discharged due to being pleased with the current functional level.  ?????        PT End of Session - 06/16/20 1315    Visit Number 11    Number of Visits 20    Date for PT Re-Evaluation 06/30/20    Authorization Type Primary [employer] Medicare Secondary Medicaid    Authorization Time Period 04/21/20-06/02/20; 8 visits approved 06/02/20-06/30/20    Authorization - Visit Number 3    Authorization - Number of Visits 8    Progress Note Due on Visit 18    PT Start Time 1315    PT Stop Time 1339    PT Time Calculation (min) 24 min    Activity Tolerance Patient tolerated treatment well    Behavior During Therapy WFL for tasks assessed/performed           Past Medical History:  Diagnosis Date  . Chronic back pain    both legs;DDD and stenosis   . Complication of anesthesia    had extreme headache after last back surgery - was in pacu for extended period  . Muscle spasm    takes Valium daily as needed  . Neuromuscular disorder ([place])   . PONV (postoperative nausea and vomiting)   . Rheumatoid arthritis [facility])     Past Surgical History:  Procedure Laterality Date  . BACK SURGERY     at surgical center  . CHONDROPLASTY Right 07/23/2019   Procedure: CHONDROPLASTY RIGHT PATELLA  AND LATERAL FEMUR;  Surgeon: Carole Civil, MD;  Location: [facility];   Service: Orthopedics;  Laterality: Right;  . KNEE ARTHROSCOPY  08/17/2011   Procedure: ARTHROSCOPY KNEE;  Surgeon: Carole Civil, MD;  Location: [facility];  Service: Orthopedics;  Laterality: Right;  with tibia tubercle transfer and proximal realignment  . KNEE ARTHROSCOPY Right 07/23/2019   Procedure: ARTHROSCOPY RIGHT KNEE;  Surgeon: Carole Civil, MD;  Location: [facility];  Service: Orthopedics;  Laterality: Right;  . KNEE ARTHROSCOPY WITH FULKERSON SLIDE Left 08/29/2012   Procedure: Arthroscopic lateral release with chondroplasty left knee;  Surgeon: Carole Civil, MD;  Location: [facility];  Service: Orthopedics;  Laterality: Left;  . KNEE ARTHROSCOPY WITH MEDIAL PATELLAR FEMORAL LIGAMENT RECONSTRUCTION Left 08/29/2012   Procedure: Open proximal realignment fulkerson;  Surgeon: Carole Civil, MD;  Location: [facility];  Service: Orthopedics;  Laterality: Left;  . KNEE SURGERY  2012   right knee-arthroscopy  . LUMBAR LAMINECTOMY/DECOMPRESSION MICRODISCECTOMY Right 06/11/2014   Procedure: Right L4-5 Lumbar diskectomy;  Surgeon: Floyce Stakes, MD;  Location: [place] [facility];  Service: Neurosurgery;  Laterality: Right;  Right L4-5 Lumbar diskectomy  . right arm  2008   rods from fractured arm  . TUBAL LIGATION  1995    There were no vitals filed for this visit.   Subjective Assessment - 06/16/20 1317    Subjective Patient states 85% improvement with  PT intervention. Her home exercises are going well and she is not having any issues with anything besides stiffness with transitions to standing.    Patient Stated Goals to get back to moving on her own    Currently in Pain? No/denies    Pain Onset More than a month ago              [facility] PT Assessment - 06/16/20 0001      Assessment   Medical Diagnosis R TKA    Referring Provider (PT) Mayer Camel    Onset Date/Surgical Date 02/26/20    Next MD Visit Tomorrow    Prior Therapy Yes      Precautions   Precautions None       Restrictions   Weight Bearing Restrictions No      Prior Function   Level of Independence Independent      Cognition   Overall Cognitive Status Within Functional Limits for tasks assessed      Observation/Other Assessments   Observations ambulates without AD with minimal antalgic gait immediatly following sitting due to stiffness    Focus on Therapeutic Outcomes (FOTO)  99% function      AROM   Right Knee Extension 0    Right Knee Flexion 129      Strength   Right Knee Flexion 4+/5    Right Knee Extension 5/5      Ambulation/Gait   Ambulation/Gait Yes    Ambulation/Gait Assistance 7: Independent    Ambulation Distance (Feet) 430 Feet    Assistive device None    Stairs Yes    Stairs Assistance 7: Independent    Stair Management Technique Alternating pattern    Number of Stairs 8    Height of Stairs 7    Gait Comments 2MWT; stairs: requires use of handrail, slight lack of eccentric control                                 PT Education - 06/16/20 1316    Education Details Patient educated on HEP, exercise mechanics    Person(s) Educated Patient    Methods Explanation;Demonstration    Comprehension Verbalized understanding;Returned demonstration            PT Short Term Goals - 06/02/20 1155      PT SHORT TERM GOAL #1   Title Patient will be independent with HEP in order to improve functional outcomes.    Time 3    Period Weeks    Status Achieved    Target Date 05/12/20      PT SHORT TERM GOAL #2   Title Patient will report at least 25% improvement in symptoms for improved quality of life.    Time 3    Period Weeks    Status Achieved    Target Date 05/12/20             PT Long Term Goals - 06/16/20 1508      PT LONG TERM GOAL #1   Title Patient will report at least 75% improvement in symptoms for improved quality of life.    Time 6    Period Weeks    Status Achieved      PT LONG TERM GOAL #2   Title Patient will improve FOTO  score by at least 20 points in order to indicate improved tolerance to activity.    Time 6    Period Weeks  Status Achieved      PT LONG TERM GOAL #3   Title Patient will be able to navigate stairs with reciprocal pattern without compensation in order to demonstrate improved LE strength.    Time 6    Period Weeks    Status Partially Met      PT LONG TERM GOAL #4   Title Patient will be able to ambulate at least 450 feet in 2MWT in order to demonstrate improved gait speed for community ambulation.    Time 6    Period Weeks    Status Partially Met                 Plan - 06/16/20 1315    Clinical Impression Statement Patient has met 2/2 short term goals and 2/4 long term goals with ability to complete HEP and improvement in symptoms, activity tolerance, stairs, gait speed, ROM, strength. Patient continues to ambulate with minimally antalgic gait and has minimally decreased eccentric control with stairs which leads to remaining goals not met at this time. Patient ROM from 0-129 with c/o stiffness after prolonged sitting. Patient given additional HEP handouts. Patient discharged from physical therapy at this time.    Personal Factors and Comorbidities Fitness;Behavior Pattern;Past/Current Experience;Comorbidity 3+;Time since onset of injury/illness/exacerbation    Comorbidities : Arthritis, Back pain, Osteoporosis, chronic knee pain    Examination-Activity Limitations Locomotion Level;Transfers;Bend;Stand;Stairs;Squat;Lift;Carry    Examination-Participation Restrictions Church;Meal Prep;Cleaning;Yard Work;Volunteer;Shop    Stability/Clinical Decision Making Stable/Uncomplicated    Rehab Potential Good    PT Frequency --    PT Duration --    PT Treatment/Interventions ADLs/Self Care Home Management;Aquatic Therapy;Cryotherapy;Scientist, product/process development;Iontophoresis 34m/ml Dexamethasone;Moist Heat;Traction;Contrast Bath;Gait training;Stair training;Functional mobility  training;Therapeutic activities;Therapeutic exercise;Balance training;Neuromuscular re-education;Patient/family education;Manual techniques;Dry needling;Energy conservation;Splinting;Taping;Passive range of motion;Scar mobilization;Compression bandaging;Orthotic Fit/Training;Spinal Manipulations;Joint Manipulations    PT Next Visit Plan n/a    PT Home Exercise Plan 12/16 LAQ, quad set, heel slides 12/21 squat, SLR 05/26/20: heel raise, hip abd/ ext, single leg balance 2/3 hip slide outs 2/10 forward and lateral step downs, SLS with vectors    Consulted and Agree with Plan of Care Patient           Patient will benefit from skilled therapeutic intervention in order to improve the following deficits and impairments:  Abnormal gait,Decreased range of motion,Difficulty walking,Pain,Increased muscle spasms,Decreased endurance,Decreased activity tolerance,Decreased balance,Impaired flexibility,Improper body mechanics,Decreased strength,Decreased mobility  Visit Diagnosis: Right knee pain, unspecified chronicity  Muscle weakness (generalized)  Other abnormalities of gait and mobility  Other symptoms and signs involving the musculoskeletal system  Stiffness of right knee, not elsewhere classified     Problem List Patient Active Problem List   Diagnosis Date Noted  . S/P right knee arthroscopy for chondroplasty patella 07/23/19  07/30/2019  . Microcytic anemia 07/09/2019  . Lumbar degenerative disc disease 09/22/2014  . Lumbar herniated disc 06/11/2014  . Chronic pain 05/14/2013  . HNP (herniated nucleus pulposus), lumbar 05/14/2013  . Leg weakness, bilateral 04/21/2013  . Radicular pain of left lower extremity 03/31/2013  . Retrolisthesis 03/31/2013  . Effusion of knee joint 10/21/2012  . Status post knee surgery 10/21/2012  . 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  . Patellofemoral instability  with pain 05/22/2012  . Dislocation of patella 01/16/2012  . Knee pain, bilateral 01/16/2012  . Difficulty in walking 10/31/2011  . Pain in right knee 10/31/2011  . Weakness of right leg 10/31/2011  . Knee  pain 09/11/2011  . Stiffness of joint, not elsewhere classified, lower leg 09/11/2011  . Gait abnormality 09/11/2011  . Muscle weakness (generalized) 09/11/2011  . Knee instability 07/12/2011  . 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 Patellar instability 09/14/2010  . HEMARTHROSIS 04/04/2010  . SUBLUXATION PATELLAR (MALALIGNMENT) 05/04/2009  . 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    Karen Mercer 06/16/2020, 3:29 PM  [facility] [address] [place], [place], [address] Phone: [phone]   Fax:  [phone]  Name: Karen Mercer MRN: [id number] Date of Birth: 1970/11/10

## 2020-06-21 ENCOUNTER — Ambulatory Visit (HOSPITAL_COMMUNITY): Payer: Medicare HMO | Admitting: Physical Therapy

## 2020-06-23 ENCOUNTER — Ambulatory Visit (HOSPITAL_COMMUNITY): Payer: Medicare HMO | Admitting: Physical Therapy

## 2020-06-28 ENCOUNTER — Encounter (HOSPITAL_COMMUNITY): Payer: Medicare HMO | Admitting: Physical Therapy

## 2020-06-30 ENCOUNTER — Encounter (HOSPITAL_COMMUNITY): Payer: Medicare HMO | Admitting: Physical Therapy

## 2020-09-19 ENCOUNTER — Telehealth: Payer: Self-pay | Admitting: Orthopedic Surgery

## 2020-09-19 NOTE — Telephone Encounter (Signed)
I need to see her notes

## 2020-09-19 NOTE — Telephone Encounter (Signed)
Patient: Karen Mercer  DOB: 07/20/1970 MRN: [id number]  Date of Visit: @TODAYSDATE @  Attending Physician: Drue Novel MD.  Reason for Visit: Status post RIGHT TKA  Subjective History of Present Illness: Karen Mercer is here for regularly scheduled follow-up s/p TKA. Reports they are doing well, occasional aches and pains are controlled with over the counter medications. . They do not report fevers, chills or drainage from the incision site  She continues to complain of pain around the knee particularly along the medial sleeve the pes quadriceps tendon as well as iliotibial band.  Physical Examination??: ? General: Patient is alert and oriented x 3 and appears comfortable. Incision is well-approximated, no erythema, fluctuance Able to perform SLR ROM 0-115  SILT throughout LE  The incision has healed at this point   Radiographs: Multiple views of operative knee with a well-positioned total knee arthroplasty. No evidence of fracture subluxation or dislocation.   Assessment and Plan?: The patient is progressing slowly approximately 4 months s/p TKA  1. A thorough discussion was had with the patient concerning the ?postoperative course and the patient is in agreement with the plan.  3. I had a discussion with her about this continue discomfort status post total knee and that the recovery. Is really a 45-monthprocess and that she is making excellent progress however the knee ligaments and tendons are having to work to control stability of the knee and this is led to some tendinitis issues. 4. I explained this will continue to improve she will take anti-inflammatories with Tylenol in the meantime, I will provide her prescription for meloxicam _______________________  EDrue NovelMD

## 2020-09-19 NOTE — Telephone Encounter (Signed)
Patient called and stated that she had knee replacement done in October by another doctor.  She says she is having a lot of pain and wants Dr. Ruthe Mannan input.  I told her that she would need to get back with the doctor that did her surgery.  She said she had done this but got no where.    She wants to see or speak to Dr. Aline Brochure.  Please advise  Thanks

## 2020-09-20 NOTE — Telephone Encounter (Signed)
Set up a virtual visit

## 2020-09-22 ENCOUNTER — Ambulatory Visit
Admission: EM | Admit: 2020-09-22 | Discharge: 2020-09-22 | Disposition: A | Discharge status: Home / Self Care | Payer: Medicare HMO | Attending: Emergency Medicine | Admitting: Emergency Medicine

## 2020-09-22 ENCOUNTER — Other Ambulatory Visit: Payer: Self-pay

## 2020-09-22 ENCOUNTER — Ambulatory Visit (INDEPENDENT_AMBULATORY_CARE_PROVIDER_SITE_OTHER): Payer: Medicare HMO

## 2020-09-22 ENCOUNTER — Encounter: Payer: Self-pay | Admitting: Emergency Medicine

## 2020-09-22 DIAGNOSIS — S6722XA Crushing injury of left hand, initial encounter: Secondary | ICD-10-CM

## 2020-09-22 DIAGNOSIS — W208XXA Other cause of strike by thrown, projected or falling object, initial encounter: Secondary | ICD-10-CM

## 2020-09-22 DIAGNOSIS — M79642 Pain in left hand: Secondary | ICD-10-CM

## 2020-09-22 HISTORY — DX: Essential (primary) hypertension: I10

## 2020-09-22 MED ORDER — MELOXICAM 15 MG PO TABS: 15.0000 mg | ORAL_TABLET | Freq: Every day | ORAL | 0 refills | Status: DC

## 2020-09-22 NOTE — ED Provider Notes (Signed)
[place]   [id number] 09/22/20 Arrival Time: 7322  CC: LT hand PAIN  SUBJECTIVE: History from: patient. Karen Mercer is a 50 y.o. female complains of LT hand pain and injury x 1 week.  Can of food fell on top of hand.  Localizes the pain to the top of hand.  Describes the pain as intermittent and sharp in character.  Has tried OTC medications without relief.  Symptoms are made worse with making a fist.  Complains of associated swelling.  Denies fever, chills, erythema, ecchymosis.   ROS: As per HPI.  All other pertinent ROS negative.     Past Medical History:  Diagnosis Date  . Chronic back pain    both legs;DDD and stenosis   . Complication of anesthesia    had extreme headache after last back surgery - was in pacu for extended period  . Hypertension   . Muscle spasm    takes Valium daily as needed  . Neuromuscular disorder ([place])   . PONV (postoperative nausea and vomiting)   . Rheumatoid arthritis [facility])    Past Surgical History:  Procedure Laterality Date  . BACK SURGERY     at surgical center  . CHONDROPLASTY Right 07/23/2019   Procedure: CHONDROPLASTY RIGHT PATELLA  AND LATERAL FEMUR;  Surgeon: Carole Civil, MD;  Location: [facility];  Service: Orthopedics;  Laterality: Right;  . KNEE ARTHROSCOPY  08/17/2011   Procedure: ARTHROSCOPY KNEE;  Surgeon: Carole Civil, MD;  Location: [facility];  Service: Orthopedics;  Laterality: Right;  with tibia tubercle transfer and proximal realignment  . KNEE ARTHROSCOPY Right 07/23/2019   Procedure: ARTHROSCOPY RIGHT KNEE;  Surgeon: Carole Civil, MD;  Location: [facility];  Service: Orthopedics;  Laterality: Right;  . KNEE ARTHROSCOPY WITH FULKERSON SLIDE Left 08/29/2012   Procedure: Arthroscopic lateral release with chondroplasty left knee;  Surgeon: Carole Civil, MD;  Location: [facility];  Service: Orthopedics;  Laterality: Left;  . KNEE ARTHROSCOPY WITH MEDIAL PATELLAR FEMORAL LIGAMENT RECONSTRUCTION Left  08/29/2012   Procedure: Open proximal realignment fulkerson;  Surgeon: Carole Civil, MD;  Location: [facility];  Service: Orthopedics;  Laterality: Left;  . KNEE SURGERY  2012   right knee-arthroscopy  . LUMBAR LAMINECTOMY/DECOMPRESSION MICRODISCECTOMY Right 06/11/2014   Procedure: Right L4-5 Lumbar diskectomy;  Surgeon: Floyce Stakes, MD;  Location: [place] [facility];  Service: Neurosurgery;  Laterality: Right;  Right L4-5 Lumbar diskectomy  . right arm  2008   rods from fractured arm  . TUBAL LIGATION  1995   Allergies  Allergen Reactions  . Tramadol Itching and Nausea Only   No current facility-administered medications on file prior to encounter.   Current Outpatient Medications on File Prior to Encounter  Medication Sig Dispense Refill  . amLODipine (NORVASC) 5 MG tablet Take 1 tablet (5 mg total) by mouth daily. 30 tablet 0  . ibuprofen (ADVIL) 800 MG tablet Take 1 tablet (800 mg total) by mouth every 8 (eight) hours as needed. 30 tablet 0  . Vitamin D, Ergocalciferol, (DRISDOL) 1.25 MG (50000 UNIT) CAPS capsule Take 50,000 Units by mouth once a week.     Social History   Socioeconomic History  . Marital status: Single    Spouse name: Not on file  . Number of children: 4  . Years of education: Not on file  . Highest education level: Not on file  Occupational History  . Occupation: Disabled  Tobacco Use  . Smoking status: Current Every Day Smoker  Packs/day: 1.00    Years: 20.00    Pack years: 20.00    Types: Cigarettes  . Smokeless tobacco: Never Used  Vaping Use  . Vaping Use: Never used  Substance and Sexual Activity  . Alcohol use: Yes    Comment: OCCASIONALLY   . Drug use: No  . Sexual activity: Yes    Birth control/protection: Surgical    Comment: tubal  Other Topics Concern  . Not on file  Social History Narrative  . Not on file   Social Determinants of Health   Financial Resource Strain: Not on file  Food Insecurity: Not on file  Transportation  Needs: Not on file  Physical Activity: Not on file  Stress: Not on file  Social Connections: Not on file  Intimate Partner Violence: Not on file   Family History  Problem Relation Age of Onset  . Arthritis Other        family history   . Hypertension Mother   . Seizures Son   . Healthy Son   . Healthy Son   . Healthy Daughter   . Healthy Sister   . Hypertension Brother   . Hypertension Maternal Grandmother   . Hypertension Paternal Grandmother   . Healthy Sister   . Healthy Sister   . Healthy Sister     OBJECTIVE:  Vitals:   09/22/20 1601  BP: (!) 135/91  Pulse: 73  Resp: 18  Temp: 98.1 F (36.7 C)  TempSrc: Oral  SpO2: 100%    General appearance: ALERT; in no acute distress.  Head: NCAT Lungs: Normal respiratory effort CV: radial pulse 2+ Musculoskeletal: LT hand Inspection: Skin warm, dry, clear and intact without obvious erythema, effusion, or ecchymosis.  Palpation: diffusely TTP 2nd and 3rd MCs ROM: FROM active and passive Strength: decreased grip strength Skin: warm and dry Neurologic: Ambulates without difficulty; Sensation intact about the upper/ lower extremities Psychological: alert and cooperative; normal mood and affect  DIAGNOSTIC STUDIES:  DG Hand Complete Left  Result Date: 09/22/2020 CLINICAL DATA:  Heavy object fell on hand EXAM: LEFT HAND - COMPLETE 3+ VIEW COMPARISON:  None. FINDINGS: Frontal, oblique, and lateral views were obtained. There is no fracture or dislocation. The joint spaces appear normal. No erosive change. IMPRESSION: No fracture or dislocation.  No evident arthropathy. Electronically Signed   By: Lowella Grip III M.D.   On: 09/22/2020 16:20    X-rays negative for bony abnormalities including fracture, or dislocation.  No soft tissue swelling.    I have reviewed the x-rays myself and the radiologist interpretation. I am in agreement with the radiologist interpretation.     ASSESSMENT & PLAN:  1. Left hand pain   2.  Crushing injury of left hand, initial encounter     Meds ordered this encounter  Medications  . meloxicam (MOBIC) 15 MG tablet    Sig: Take 1 tablet (15 mg total) by mouth daily.    Dispense:  20 tablet    Refill:  0    Order Specific Question:   Supervising Provider    Answer:   Raylene Everts [[id number]]   X-rays negative for fracture or dislocation Continue conservative management of rest, ice, and elevation Take mobic as needed for pain relief (may cause abdominal discomfort, ulcers, and GI bleeds avoid taking with other NSAIDs) Follow up with PCP if symptoms persist Return or go to the ER if you have any new or worsening symptoms (fever, chills, chest pain, redness, swelling, bruising, deformity, etc...)  Reviewed expectations re: course of current medical issues. Questions answered. Outlined signs and symptoms indicating need for more acute intervention. Patient verbalized understanding. After Visit Summary given.    Lestine Box, PA-C 09/22/20 1658

## 2020-09-22 NOTE — Discharge Instructions (Signed)
X-rays negative for fracture or dislocation Continue conservative management of rest, ice, and elevation Take mobic as needed for pain relief (may cause abdominal discomfort, ulcers, and GI bleeds avoid taking with other NSAIDs) Follow up with PCP if symptoms persist Return or go to the ER if you have any new or worsening symptoms (fever, chills, chest pain, redness, swelling, bruising, deformity, etc...)

## 2020-09-22 NOTE — ED Triage Notes (Signed)
Can of food fell on hand last week.  Swelling to top of left hand.

## 2020-09-26 ENCOUNTER — Ambulatory Visit (INDEPENDENT_AMBULATORY_CARE_PROVIDER_SITE_OTHER): Payer: Medicare HMO | Admitting: Orthopedic Surgery

## 2020-09-26 ENCOUNTER — Other Ambulatory Visit: Payer: Self-pay

## 2020-09-26 DIAGNOSIS — M25569 Pain in unspecified knee: Secondary | ICD-10-CM

## 2020-09-26 DIAGNOSIS — M25561 Pain in right knee: Secondary | ICD-10-CM | POA: Diagnosis not present

## 2020-09-26 DIAGNOSIS — Z96651 Presence of right artificial knee joint: Secondary | ICD-10-CM | POA: Diagnosis not present

## 2020-09-26 NOTE — Progress Notes (Signed)
Virtual Visit via Telephone Note  I connected with Ethelene Closser Pettie on 09/26/20 at  4:40 PM EDT by telephone and verified that I am speaking with the correct person using two identifiers.  Location: Patient: HOME Provider: OFFICE    I discussed the limitations, risks, security and privacy concerns of performing an evaluation and management service by telephone and the availability of in person appointments. I also discussed with the patient that there may be a patient responsible charge related to this service. The patient expressed understanding and agreed to proceed.   History of Present Illness: -RT TKA- severe pain, swelling oct 22 surgery Surgery: [facility]  Observations/Objective:   Assessment and Plan: TOLD TO APPLY ICE   Follow Up Instructions:    I discussed the assessment and treatment plan with the patient. The patient was provided an opportunity to ask questions and all were answered. The patient agreed with the plan and demonstrated an understanding of the instructions.   The patient was advised to call back or seek an in-person evaluation if the symptoms worsen or if the condition fails to improve as anticipated.  I provided 5 minutes of non-face-to-face time during this encounter.   Arther Abbott, MD

## 2020-09-27 ENCOUNTER — Telehealth: Payer: Self-pay | Admitting: Orthopedic Surgery

## 2020-09-27 NOTE — Telephone Encounter (Signed)
Patient called back to give Dr. Aline Brochure the name of the doctor that did her knee surgery.   Dr. Mayer Camel  Phone Number [phone]  Please call her back

## 2020-10-10 ENCOUNTER — Ambulatory Visit: Payer: Medicare HMO | Admitting: Orthopedic Surgery

## 2021-01-24 ENCOUNTER — Encounter: Payer: Self-pay | Admitting: Adult Health

## 2021-01-24 ENCOUNTER — Other Ambulatory Visit: Payer: Self-pay

## 2021-01-24 ENCOUNTER — Ambulatory Visit (INDEPENDENT_AMBULATORY_CARE_PROVIDER_SITE_OTHER): Payer: Medicare HMO | Admitting: Adult Health

## 2021-01-24 VITALS — BP 144/95 | HR 78 | Ht 62.0 in | Wt 139.0 lb

## 2021-01-24 DIAGNOSIS — N926 Irregular menstruation, unspecified: Secondary | ICD-10-CM | POA: Diagnosis not present

## 2021-01-24 DIAGNOSIS — R1031 Right lower quadrant pain: Secondary | ICD-10-CM

## 2021-01-24 DIAGNOSIS — R232 Flushing: Secondary | ICD-10-CM | POA: Diagnosis not present

## 2021-01-24 DIAGNOSIS — R61 Generalized hyperhidrosis: Secondary | ICD-10-CM | POA: Diagnosis not present

## 2021-01-24 NOTE — Progress Notes (Signed)
  Subjective:     Patient ID: Karen Mercer, female   DOB: 10/03/1970, 50 y.o.   MRN: [id number]  HPI [name] is a 50 year old black female,single, G4P4 in complaining of RLQ, has ?fibroid seen on MR spine,in 2019, has had 3 back surgeries. PCP is Dr Maudie Mercury. Lab Results  Component Value Date   DIAGPAP  10/04/2017    NEGATIVE FOR INTRAEPITHELIAL LESIONS OR MALIGNANCY. BENIGN REACTIVE/REPARATIVE CHANGES.   HPV DETECTED (A) 10/04/2017    Review of Systems Patient denies any headaches, hearing loss, fatigue, blurred vision, shortness of breath, chest pain, problems with bowel movements, urination, or intercourse. No joint pain or mood swings.    +irregular periods, skipped 3 then heavy with clots +hot flashes +night sweats Reviewed past medical,surgical, social and family history. Reviewed medications and allergies.  Objective:   Physical Exam BP (!) 144/95 (BP Location: Left Arm, Patient Position: Sitting, Cuff Size: Normal)   Pulse 78   Ht 5' 2"  (1.575 m)   Wt 139 lb (63 kg)   LMP 01/02/2021   BMI 25.42 kg/m     Skin warm and dry.Pelvic: external genitalia is normal in appearance no lesions, vagina: pink and moist,urethra has no lesions or masses noted, cervix:smooth and bulbous, uterus: normal size, shape and contour, mildly tender, no masses felt, adnexa: no masses, RLQ tenderness noted. Bladder is non tender and no masses felt. AA is 4 Fall risk is low Depression screen [facility] 2/9 01/24/2021 10/04/2017  Decreased Interest 1 0  Down, Depressed, Hopeless 1 0  PHQ - 2 Score 2 0  Altered sleeping 1 -  Tired, decreased energy 1 -  Change in appetite 0 -  Feeling bad or failure about yourself  1 -  Trouble concentrating 0 -  Moving slowly or fidgety/restless 0 -  Suicidal thoughts 0 -  PHQ-9 Score 5 -  Some recent data might be hidden    GAD 7 : Generalized Anxiety Score 01/24/2021  Nervous, Anxious, on Edge 1  Control/stop worrying 1  Worry too much - different things 1  Trouble  relaxing 1  Restless 0  Easily annoyed or irritable 0  Afraid - awful might happen 0  Total GAD 7 Score 4      Upstream - 01/24/21 1335       Pregnancy Intention Screening   Does the patient want to become pregnant in the next year? No    Does the patient's partner want to become pregnant in the next year? No    Would the patient like to discuss contraceptive options today? No      Contraception Wrap Up   Current Method Female Sterilization    End Method Female Sterilization    Contraception Counseling Provided No            Examination chaperoned by Corporate investment banker.  Assessment:     1. RLQ abdominal pain Pelvic US scheduled at [facility] for 01/31/21 at 9:30 am to assess uterus and ovaries  2. Irregular periods  3. Hot flashes  4. Night sweats     Plan:     Return 02/22/21 for pap and physical with me

## 2021-01-31 ENCOUNTER — Ambulatory Visit (HOSPITAL_COMMUNITY): Payer: Medicare HMO

## 2021-02-06 ENCOUNTER — Ambulatory Visit (HOSPITAL_COMMUNITY)
Admission: RE | Admit: 2021-02-06 | Discharge: 2021-02-06 | Disposition: A | Discharge status: Home / Self Care | Payer: Medicare HMO | Source: Ambulatory Visit | Attending: Adult Health | Admitting: Adult Health

## 2021-02-06 ENCOUNTER — Other Ambulatory Visit: Payer: Self-pay

## 2021-02-06 DIAGNOSIS — R1031 Right lower quadrant pain: Secondary | ICD-10-CM | POA: Diagnosis present

## 2021-02-08 ENCOUNTER — Telehealth: Payer: Self-pay | Admitting: Adult Health

## 2021-02-08 ENCOUNTER — Encounter: Payer: Self-pay | Admitting: Adult Health

## 2021-02-08 DIAGNOSIS — D219 Benign neoplasm of connective and other soft tissue, unspecified: Secondary | ICD-10-CM

## 2021-02-08 HISTORY — DX: Benign neoplasm of connective and other soft tissue, unspecified: D21.9

## 2021-02-08 NOTE — Telephone Encounter (Signed)
Pt aware of [place] results, has appt 02/22/21 foor pap and physical. She is interested in having fibroid removed, is in pain

## 2021-02-08 NOTE — Telephone Encounter (Signed)
Pt would like to talk to [place] about her recent U/S results

## 2021-02-22 ENCOUNTER — Other Ambulatory Visit: Payer: Medicare HMO | Admitting: Adult Health

## 2021-03-09 NOTE — Progress Notes (Signed)
Closing the encounter too long to remember

## 2021-04-26 ENCOUNTER — Emergency Department (HOSPITAL_COMMUNITY)
Admission: EM | Admit: 2021-04-26 | Discharge: 2021-04-26 | Disposition: A | Discharge status: Home / Self Care | Payer: Medicare HMO | Attending: Emergency Medicine | Admitting: Emergency Medicine

## 2021-04-26 ENCOUNTER — Other Ambulatory Visit: Payer: Self-pay

## 2021-04-26 ENCOUNTER — Encounter (HOSPITAL_COMMUNITY): Payer: Self-pay

## 2021-04-26 ENCOUNTER — Emergency Department (HOSPITAL_COMMUNITY): Payer: Medicare HMO

## 2021-04-26 DIAGNOSIS — M25562 Pain in left knee: Secondary | ICD-10-CM

## 2021-04-26 DIAGNOSIS — Z79899 Other long term (current) drug therapy: Secondary | ICD-10-CM | POA: Diagnosis not present

## 2021-04-26 DIAGNOSIS — I10 Essential (primary) hypertension: Secondary | ICD-10-CM | POA: Insufficient documentation

## 2021-04-26 DIAGNOSIS — Z96651 Presence of right artificial knee joint: Secondary | ICD-10-CM | POA: Insufficient documentation

## 2021-04-26 DIAGNOSIS — F17210 Nicotine dependence, cigarettes, uncomplicated: Secondary | ICD-10-CM | POA: Insufficient documentation

## 2021-04-26 DIAGNOSIS — R2242 Localized swelling, mass and lump, left lower limb: Secondary | ICD-10-CM | POA: Diagnosis not present

## 2021-04-26 MED ORDER — ACETAMINOPHEN 500 MG PO TABS
1000.0000 mg | ORAL_TABLET | Freq: Once | ORAL | Status: AC
  Administered 2021-04-26: 14:00:00 1000 mg via ORAL
  Filled 2021-04-26: qty 2

## 2021-04-26 NOTE — ED Triage Notes (Signed)
Left side knee pain since Monday.  Skin warm and dry hx of knee sx, pain 10/10

## 2021-04-26 NOTE — Discharge Instructions (Addendum)
Please continue to wear your knee brace as needed for comfort.  Please follow-up with Dr. Aline Brochure regarding your symptoms.  If you develop any new or worsening symptoms please come back to the emergency department.  It was a pleasure to meet you.

## 2021-04-26 NOTE — ED Provider Notes (Signed)
[facility] EMERGENCY DEPARTMENT Provider Note   CSN: [id number] Arrival date & time: 04/26/21  1304     History Chief Complaint  Patient presents with   Knee Pain    Karen Mercer is a 50 y.o. female.  HPI Patient is a 50 year old female with a history of hypertension, neuromuscular disorder, RA, who presents to the emergency department due to left knee pain.  Patient states that she has had multiple surgeries to the bilateral knees and also notes a total right knee arthroplasty.  She states that at times when standing her knees will hyperextend and her patellas will dislocate. They quickly relocate but sometimes this causes her pain. She states that this occurred two days ago and she has been having persistent pain in the left knee since this occurred.  Knee worsens when bearing weight or when ambulating.  No numbness.  No fevers, chills, nausea, vomiting.  No new falls or trauma.    Past Medical History:  Diagnosis Date   Chronic back pain    both legs;DDD and stenosis    Complication of anesthesia    had extreme headache after last back surgery - was in pacu for extended period   Fibroid 02/08/2021   5.9 cm subserosal fibroid in [place]     Hypertension    Muscle spasm    takes Valium daily as needed   Neuromuscular disorder ([facility])    PONV (postoperative nausea and vomiting)    Rheumatoid arthritis ([place])     Patient Active Problem List   Diagnosis Date Noted   Fibroid 02/08/2021   RLQ abdominal pain 01/24/2021   S/P right knee arthroscopy for chondroplasty patella 07/23/19  07/30/2019   Microcytic anemia 07/09/2019   Lumbar degenerative disc disease 09/22/2014   Lumbar herniated disc 06/11/2014   Chronic pain 05/14/2013   HNP (herniated nucleus pulposus), lumbar 05/14/2013   Leg weakness, bilateral 04/21/2013   Radicular pain of left lower extremity 03/31/2013   Retrolisthesis 03/31/2013   Effusion of knee joint 10/21/2012   Status post knee surgery 10/21/2012    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   Patellofemoral instability with pain 05/22/2012   Dislocation of patella 01/16/2012   Knee pain, bilateral 01/16/2012   Difficulty in walking 10/31/2011   Pain in right knee 10/31/2011   Weakness of right leg 10/31/2011   Knee pain 09/11/2011   Stiffness of joint, not elsewhere classified, lower leg 09/11/2011   Gait abnormality 09/11/2011   Muscle weakness (generalized) 09/11/2011   Knee instability 07/12/2011   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   Patellar instability 09/14/2010   HEMARTHROSIS 04/04/2010   SUBLUXATION PATELLAR (MALALIGNMENT) 05/04/2009   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    Past Surgical History:  Procedure Laterality Date   BACK SURGERY     at surgical center   CHONDROPLASTY Right 07/23/2019   Procedure: CHONDROPLASTY RIGHT PATELLA  AND LATERAL FEMUR;  Surgeon: Carole Civil, MD;  Location: [facility];  Service: Orthopedics;  Laterality: Right;   KNEE ARTHROSCOPY  08/17/2011   Procedure: ARTHROSCOPY KNEE;  Surgeon: Carole Civil, MD;  Location: [facility];  Service: Orthopedics;  Laterality: Right;  with tibia tubercle transfer and proximal realignment   KNEE ARTHROSCOPY Right 07/23/2019   Procedure: ARTHROSCOPY RIGHT KNEE;  Surgeon: Carole Civil, MD;  Location: [facility];  Service: Orthopedics;  Laterality: Right;   KNEE ARTHROSCOPY  WITH FULKERSON SLIDE Left 08/29/2012   Procedure: Arthroscopic lateral release with chondroplasty left knee;  Surgeon: Carole Civil, MD;  Location: [facility];  Service: Orthopedics;  Laterality: Left;   KNEE ARTHROSCOPY WITH MEDIAL PATELLAR FEMORAL LIGAMENT RECONSTRUCTION Left 08/29/2012   Procedure: Open proximal realignment fulkerson;  Surgeon: Carole Civil, MD;  Location: [facility];  Service: Orthopedics;  Laterality: Left;   KNEE  SURGERY  2012   right knee-arthroscopy   LUMBAR LAMINECTOMY/DECOMPRESSION MICRODISCECTOMY Right 06/11/2014   Procedure: Right L4-5 Lumbar diskectomy;  Surgeon: Floyce Stakes, MD;  Location: [place] [facility];  Service: Neurosurgery;  Laterality: Right;  Right L4-5 Lumbar diskectomy   right arm  2008   rods from fractured arm   TUBAL LIGATION  1995     OB History     Gravida  4   Para  4   Term  4   Preterm      AB      Living  4      SAB      IAB      Ectopic      Multiple      Live Births  4           Family History  Problem Relation Age of Onset   Arthritis Other        family history    Hypertension Mother    Seizures Son    Healthy Son    Healthy Son    Healthy Daughter    Healthy Sister    Hypertension Brother    Hypertension Maternal Grandmother    Hypertension Paternal Geophysicist/field seismologist    Healthy Sister    Healthy Sister    Healthy Sister     Social History   Tobacco Use   Smoking status: Every Day    Packs/day: 1.00    Years: 20.00    Pack years: 20.00    Types: Cigarettes   Smokeless tobacco: Never  Vaping Use   Vaping Use: Never used  Substance Use Topics   Alcohol use: Yes    Comment: OCCASIONALLY    Drug use: No    Home Medications Prior to Admission medications   Medication Sig Start Date End Date Taking? Authorizing Provider  ALPRAZolam Duanne Moron) 0.5 MG tablet Take 0.5 mg by mouth 2 (two) times daily as needed for anxiety. 10/24/20  Yes [provider]  ibuprofen (ADVIL) 800 MG tablet Take 1 tablet (800 mg total) by mouth every 8 (eight) hours as needed. Patient not taking: Reported on 01/24/2021 09/23/19   Carole Civil, MD  meloxicam (MOBIC) 15 MG tablet Take 1 tablet (15 mg total) by mouth daily. Patient not taking: Reported on 01/24/2021 09/22/20   Stacey Drain [place], PA-C    Allergies    Tramadol  Review of Systems   Review of Systems  Constitutional:  Negative for chills and fever.  Gastrointestinal:  Negative  for nausea and vomiting.  Musculoskeletal:  Positive for arthralgias and myalgias.  Skin:  Negative for color change and wound.  Neurological:  Negative for weakness and numbness.   Physical Exam Updated Vital Signs BP (!) 161/100 (BP Location: Right Arm)    Pulse 71    Temp 98.4 F (36.9 C) (Oral)    Resp 18    Ht 5' 2"  (1.575 m)    Wt 64 kg    LMP 04/05/2021 (Exact Date)    SpO2 99%    BMI 25.79 kg/m   Physical  Exam Vitals and nursing note reviewed.  Constitutional:      General: She is not in acute distress.    Appearance: Normal appearance. She is not ill-appearing, toxic-appearing or diaphoretic.  HENT:     Head: Normocephalic and atraumatic.     Right Ear: External ear normal.     Left Ear: External ear normal.     Nose: Nose normal.     Mouth/Throat:     Mouth: Mucous membranes are moist.     Pharynx: Oropharynx is clear. No oropharyngeal exudate or posterior oropharyngeal erythema.  Eyes:     Extraocular Movements: Extraocular movements intact.  Cardiovascular:     Rate and Rhythm: Normal rate and regular rhythm.     Pulses: Normal pulses.     Heart sounds: Normal heart sounds. No murmur heard.   No friction rub. No gallop.  Pulmonary:     Effort: Pulmonary effort is normal. No respiratory distress.     Breath sounds: Normal breath sounds. No stridor. No wheezing, rhonchi or rales.  Abdominal:     General: Abdomen is flat.     Tenderness: There is no abdominal tenderness.  Musculoskeletal:        General: Swelling and tenderness present. Normal range of motion.     Cervical back: Normal range of motion and neck supple. No tenderness.     Comments: Mild TTP noted along the left lateral joint line of the left knee.  Full active and passive range of motion of the knee.  Mild soft tissue swelling.  No increased warmth.  Distal sensation intact.  2+ DP pulses.  Patient is able to stand and ambulate unassisted.  Skin:    General: Skin is warm and dry.  Neurological:      General: No focal deficit present.     Mental Status: She is alert and oriented to person, place, and time.  Psychiatric:        Mood and Affect: Mood normal.        Behavior: Behavior normal.   ED Results / Procedures / Treatments   Labs (all labs ordered are listed, but only abnormal results are displayed) Labs Reviewed - No data to display  EKG None  Radiology DG Knee Complete 4 Views Left  Result Date: 04/26/2021 CLINICAL DATA:  Left knee pain beginning 2 days ago. EXAM: LEFT KNEE - COMPLETE 4+ VIEW COMPARISON:  07/16/2018 FINDINGS: No visible change. Small amount of joint fluid. Screws in the region of the proximal tibia as seen previously. Medial calcification related to distant MCL injury. No new finding. IMPRESSION: No change. Previous tibial screws. Old MCL injury. Small amount of joint fluid. Electronically Signed   By: Nelson Chimes M.D.   On: 04/26/2021 13:52    Procedures Procedures   Medications Ordered in ED Medications  acetaminophen (TYLENOL) tablet 1,000 mg (1,000 mg Oral Given 04/26/21 1335)    ED Course  I have reviewed the triage vital signs and the nursing notes.  Pertinent labs & imaging results that were available during my care of the patient were reviewed by me and considered in my medical decision making (see chart for details).    MDM Rules/Calculators/A&P                          Pt is a 50 y.o. female who presents to the emergency department with acute on chronic left knee pain.  Imaging: X-ray of the left knee shows no  change.  Previous tibial screws.  Old MCL injury.  Small amount of joint fluid.  I, Rayna Sexton, PA-C, personally reviewed and evaluated these images and lab results as part of my medical decision-making.  X-ray findings discussed with the patient.  She does have mild tenderness along the lateral joint line of the knee.  Mild soft tissue swelling.  Full range of motion of the knee.  Patient is able to stand and ambulate  unassisted.  Neurovascularly intact distal to the knee.  Patient given a dose of Tylenol for her pain.  Will place in a knee sleeve for comfort.  Recommended follow-up with Dr. Aline Brochure with orthopedics.  We discussed return precautions.  Her questions were answered and she was amicable at the time of discharge.  Note: Portions of this report may have been transcribed using voice recognition software. Every effort was made to ensure accuracy; however, inadvertent computerized transcription errors may be present.   Final Clinical Impression(s) / ED Diagnoses Final diagnoses:  Acute pain of left knee   Rx / DC Orders ED Discharge Orders     None        Rayna Sexton, PA-C 04/26/21 1410    Carmin Muskrat, MD 04/27/21 1313

## 2021-04-27 ENCOUNTER — Encounter: Payer: Self-pay | Admitting: Orthopedic Surgery

## 2021-04-27 ENCOUNTER — Ambulatory Visit (INDEPENDENT_AMBULATORY_CARE_PROVIDER_SITE_OTHER): Payer: Medicare HMO | Admitting: Orthopedic Surgery

## 2021-04-27 VITALS — BP 208/108 | HR 66 | Ht 62.0 in | Wt 142.0 lb

## 2021-04-27 DIAGNOSIS — M25562 Pain in left knee: Secondary | ICD-10-CM

## 2021-04-27 DIAGNOSIS — M2202 Recurrent dislocation of patella, left knee: Secondary | ICD-10-CM

## 2021-04-27 DIAGNOSIS — M25462 Effusion, left knee: Secondary | ICD-10-CM

## 2021-04-27 MED ORDER — HYDROCODONE-ACETAMINOPHEN 10-325 MG PO TABS: 1.0000 | ORAL_TABLET | Freq: Four times a day (QID) | ORAL | 0 refills | Status: DC | PRN

## 2021-04-27 NOTE — Progress Notes (Signed)
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  Her ER visit was on the 21st which was yesterday  She comes in today with swelling and pain in the left knee and she is concerned that this is continuing to happen.  She did have a right knee total knee for subluxating dislocating chronic patellar situation and she was not happy with that  Physical Exam  BP (!) 208/108    Pulse 66    Ht 5' 2"  (1.575 m)    Wt 142 lb (64.4 kg)    LMP 04/05/2021 (Exact Date)    BMI 25.97 kg/m   She walked in with a limp favoring the left leg  The left knee has an effusion multiple scars from prior surgeries she has tenderness and inflammation around the patella patella is very loose in each direction medial and lateral knee flexion is limited to 90 degrees cruciate ligaments and the collateral ligaments are stable  The x-ray done at the hospital  My interpretation is that the joint spaces are still well preserved in the tibiofemoral joints she does have 2 screws in the tibial tubercle from the prior realignment surgery and the overall tibiofemoral alignment seems normal.  Impression  Recurrent patellofemoral subluxation   Plan  Aspiration injection left knee Lateral stabilizer brace Medication for pain for 5 days  Meds ordered this encounter  Medications   HYDROcodone-acetaminophen  (NORCO) 10-325 MG tablet    Sig: Take 1 tablet by mouth every 6 (six) hours as needed.    Dispense:  30 tablet    Refill:  0   Procedure note aspiration injection left knee  Procedure note injection and aspiration left knee joint  Verbal consent was obtained to aspirate and inject the left knee joint   Timeout was completed to confirm the site of aspiration and injection  An 18-gauge needle was used to aspirate the left knee joint from a suprapatellar lateral approach.  The medications used were 40 mg of Depo-Medrol and 1% lidocaine 3 cc  Anesthesia was provided by ethyl chloride and the skin was prepped with alcohol.  After cleaning the skin with alcohol an 18-gauge needle was used to aspirate the right knee joint.  We obtained 15 cc of fluid clear yellow  We followed this by injection of 40 mg of Depo-Medrol and 3 cc 1% lidocaine.  There were no complications. A sterile bandage was applied.   Brace 4 weeks then return for reevaluation and we will order therapy at that point

## 2021-05-16 ENCOUNTER — Other Ambulatory Visit: Payer: Self-pay | Admitting: Orthopedic Surgery

## 2021-05-16 NOTE — Telephone Encounter (Signed)
Patient requests refill: HYDROcodone-acetaminophen (NORCO) 10-325 MG tablet 30 tablet       [employer]

## 2021-05-17 ENCOUNTER — Telehealth: Payer: Self-pay | Admitting: Radiology

## 2021-05-17 NOTE — Telephone Encounter (Signed)
Patient called wants to know why Hydrocodone not refilled  I told her your office note states return 4 weeks see if therapy needed she states she has appointment both of her knees are painful She states her doctor for the right knee replacement has not called her back  She states her left knee is worsening and she has more fluid

## 2021-05-18 NOTE — Telephone Encounter (Signed)
I called her to advise and she voiced understanding  Asked her to call [facility] and make appointment for the knee she had replaced there, she states she will.

## 2021-05-18 NOTE — Telephone Encounter (Signed)
The 5 days for acute pain and opioids has finished

## 2021-05-25 ENCOUNTER — Encounter: Payer: Self-pay | Admitting: Orthopedic Surgery

## 2021-05-25 ENCOUNTER — Ambulatory Visit (INDEPENDENT_AMBULATORY_CARE_PROVIDER_SITE_OTHER): Payer: Medicare HMO | Admitting: Orthopedic Surgery

## 2021-05-25 ENCOUNTER — Other Ambulatory Visit: Payer: Self-pay

## 2021-05-25 DIAGNOSIS — M2202 Recurrent dislocation of patella, left knee: Secondary | ICD-10-CM | POA: Diagnosis not present

## 2021-05-25 DIAGNOSIS — G8929 Other chronic pain: Secondary | ICD-10-CM

## 2021-05-25 DIAGNOSIS — M25562 Pain in left knee: Secondary | ICD-10-CM | POA: Diagnosis not present

## 2021-05-25 MED ORDER — HYDROCODONE-ACETAMINOPHEN 5-325 MG PO TABS: 1.0000 | ORAL_TABLET | Freq: Four times a day (QID) | ORAL | 0 refills | Status: AC | PRN

## 2021-05-25 NOTE — Progress Notes (Signed)
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  We aspirated the knee got back some fluid we injected and then placed unit lateral stabilizer and put her on 10 mg of hydrocodone  She is call back once or twice for more pain medication.  She says the knee gets stuck it locks and it feels like it still giving out  Focused orthopedic examination Left knee No effusion today Tenderness in the peripatellar region with significant crepitance and pain with patellofemoral compression I cannot get the knee to come out of place although she has a lot of apprehension medially and laterally The cruciate ligaments are stable there are no collateral ligament injuries  Recommend MRI left knee We can decrease the pain medication to 5 mg of hydrocodone every 6 hours Follow-up after MRI  Meds ordered this encounter  Medications   HYDROcodone-acetaminophen (NORCO/VICODIN) 5-325 MG tablet    Sig: Take 1 tablet by mouth every 6 (six) hours as needed for up to 5 days for moderate pain.    Dispense:  20 tablet    Refill:  0

## 2021-05-25 NOTE — Patient Instructions (Signed)
While we are working on your approval for MRI please go ahead and call to schedule your appointment with [facility] within at least one (1) week.   Central Scheduling [phone]

## 2021-06-06 ENCOUNTER — Ambulatory Visit (HOSPITAL_COMMUNITY): Payer: Medicare HMO | Attending: Orthopedic Surgery

## 2021-06-08 ENCOUNTER — Ambulatory Visit: Payer: Medicare HMO | Admitting: Orthopedic Surgery

## 2021-06-27 DIAGNOSIS — F410 Panic disorder [episodic paroxysmal anxiety] without agoraphobia: Secondary | ICD-10-CM | POA: Insufficient documentation

## 2021-06-27 DIAGNOSIS — E559 Vitamin D deficiency, unspecified: Secondary | ICD-10-CM | POA: Insufficient documentation

## 2021-07-03 ENCOUNTER — Ambulatory Visit (HOSPITAL_COMMUNITY)
Admission: RE | Admit: 2021-07-03 | Discharge: 2021-07-03 | Disposition: A | Discharge status: Home / Self Care | Payer: Medicare HMO | Source: Ambulatory Visit | Attending: Orthopedic Surgery | Admitting: Orthopedic Surgery

## 2021-07-03 ENCOUNTER — Other Ambulatory Visit: Payer: Self-pay

## 2021-07-03 DIAGNOSIS — G8929 Other chronic pain: Secondary | ICD-10-CM | POA: Insufficient documentation

## 2021-07-03 DIAGNOSIS — M25562 Pain in left knee: Secondary | ICD-10-CM | POA: Insufficient documentation

## 2021-07-06 ENCOUNTER — Other Ambulatory Visit: Payer: Self-pay

## 2021-07-06 ENCOUNTER — Ambulatory Visit (INDEPENDENT_AMBULATORY_CARE_PROVIDER_SITE_OTHER): Payer: Medicare HMO | Admitting: Orthopedic Surgery

## 2021-07-06 ENCOUNTER — Encounter: Payer: Self-pay | Admitting: Orthopedic Surgery

## 2021-07-06 DIAGNOSIS — G8929 Other chronic pain: Secondary | ICD-10-CM

## 2021-07-06 DIAGNOSIS — M25562 Pain in left knee: Secondary | ICD-10-CM | POA: Diagnosis not present

## 2021-07-06 DIAGNOSIS — M1712 Unilateral primary osteoarthritis, left knee: Secondary | ICD-10-CM | POA: Diagnosis not present

## 2021-07-06 DIAGNOSIS — M2202 Recurrent dislocation of patella, left knee: Secondary | ICD-10-CM | POA: Diagnosis not present

## 2021-07-06 MED ORDER — HYDROCODONE-ACETAMINOPHEN 10-325 MG PO TABS: 1.0000 | ORAL_TABLET | Freq: Three times a day (TID) | ORAL | 0 refills | Status: AC | PRN

## 2021-07-06 NOTE — Addendum Note (Signed)
Addended by: Obie Dredge A on: 07/06/2021 10:25 AM ? ? Modules accepted: Orders ? ?

## 2021-07-06 NOTE — Progress Notes (Signed)
Follow-up visit for [facility].  She had an MRI on February 27 ? ?She continues to complain of chronic pain in her left knee ? ?She had a right total knee for patellofemoral arthritis she says it did not work ? ?As far as the left knee goes she still has extreme pain in the knee despite bracing anti-inflammatories and opioid therapy ? ?I was able to review the MRI her major ligament structures are intact she has partial cartilage loss in the medial patellofemoral joint and somewhat mild on the medial compartment but not severe ? ?She is not a good surgical candidate for arthroscopy as chondroplasty in this setting would definitely increase the pain she is too young for knee replacement and it failed to relieve pain on the opposite side ? ?There are some other pain generators in the past including back pain ? ?My advice is for her to see a pain management specialist ? ?She prefers to stay in [place] ? ?I told her to cover her for 1 month in terms of pain medication ? ? ?Encounter Diagnoses  ?Name Primary?  ? Chronic pain of left knee Yes  ? Recurrent dislocation of left patella   ? Patellofemoral arthritis of left knee   ? ? ? ?Meds ordered this encounter  ?Medications  ? HYDROcodone-acetaminophen (NORCO) 10-325 MG tablet  ?  Sig: Take 1 tablet by mouth every 8 (eight) hours as needed.  ?  Dispense:  90 tablet  ?  Refill:  0  ? ? ?

## 2021-09-29 ENCOUNTER — Other Ambulatory Visit (HOSPITAL_COMMUNITY): Payer: Self-pay | Admitting: Adult Medicine

## 2021-09-29 DIAGNOSIS — Z1231 Encounter for screening mammogram for malignant neoplasm of breast: Secondary | ICD-10-CM

## 2021-10-05 ENCOUNTER — Ambulatory Visit (HOSPITAL_COMMUNITY): Payer: Medicare HMO

## 2021-10-05 LAB — COLOGUARD: COLOGUARD: POSITIVE — AB

## 2021-12-06 ENCOUNTER — Ambulatory Visit (HOSPITAL_COMMUNITY)
Admission: RE | Admit: 2021-12-06 | Discharge: 2021-12-06 | Disposition: A | Discharge status: Home / Self Care | Payer: Medicare HMO | Source: Ambulatory Visit | Attending: Adult Medicine | Admitting: Adult Medicine

## 2021-12-06 DIAGNOSIS — Z1231 Encounter for screening mammogram for malignant neoplasm of breast: Secondary | ICD-10-CM | POA: Insufficient documentation

## 2021-12-08 ENCOUNTER — Other Ambulatory Visit (HOSPITAL_COMMUNITY): Payer: Self-pay | Admitting: Adult Medicine

## 2021-12-12 ENCOUNTER — Other Ambulatory Visit (HOSPITAL_COMMUNITY): Payer: Self-pay | Admitting: Adult Medicine

## 2021-12-18 ENCOUNTER — Other Ambulatory Visit (HOSPITAL_COMMUNITY): Payer: Self-pay | Admitting: Adult Medicine

## 2021-12-18 DIAGNOSIS — R928 Other abnormal and inconclusive findings on diagnostic imaging of breast: Secondary | ICD-10-CM

## 2021-12-19 ENCOUNTER — Ambulatory Visit (HOSPITAL_COMMUNITY)
Admission: RE | Admit: 2021-12-19 | Discharge: 2021-12-19 | Disposition: A | Discharge status: Home / Self Care | Payer: Medicare HMO | Source: Ambulatory Visit | Attending: Adult Medicine | Admitting: Adult Medicine

## 2021-12-19 DIAGNOSIS — R928 Other abnormal and inconclusive findings on diagnostic imaging of breast: Secondary | ICD-10-CM

## 2021-12-20 ENCOUNTER — Other Ambulatory Visit: Payer: Self-pay | Admitting: Adult Medicine

## 2021-12-20 ENCOUNTER — Other Ambulatory Visit (HOSPITAL_COMMUNITY): Payer: Self-pay | Admitting: Adult Medicine

## 2021-12-20 DIAGNOSIS — R928 Other abnormal and inconclusive findings on diagnostic imaging of breast: Secondary | ICD-10-CM

## 2021-12-20 DIAGNOSIS — R921 Mammographic calcification found on diagnostic imaging of breast: Secondary | ICD-10-CM

## 2021-12-25 ENCOUNTER — Telehealth: Payer: Self-pay | Admitting: Radiology

## 2021-12-25 NOTE — Telephone Encounter (Signed)
Patient called, asked about getting a letter on our letterhead stating when her total knee was done and what the implant is made of.  I told her she would need to get with the surgeon's office where she had the surgery.   She also asked about when her back surg was, etc. And I advised this as well. Gave her these two surgeons' names and numbers to call.   She is asking you Karen Mercer to please call her, she wants a letter from [place] stating the dates of Dr Harrison's surgeries on her, and what the hardware that was implanted in those is made of.

## 2021-12-26 NOTE — Telephone Encounter (Signed)
Patient called at 9:29 and lvm for Amy to call her back at her earliest convenience.

## 2021-12-27 NOTE — Telephone Encounter (Signed)
Screws are made of metal, type of metal not sure

## 2021-12-28 ENCOUNTER — Telehealth: Payer: Self-pay

## 2021-12-28 NOTE — Telephone Encounter (Signed)
Thanks, I called her to discuss and she actually just needs a card for the airport, I will get this ready for her and put at front desk.

## 2021-12-28 NOTE — Telephone Encounter (Signed)
Patient left message wanting to speak with you in reference to her forms she just picked up today. She is asking for you to please give her a return call at [phone] today.

## 2021-12-29 ENCOUNTER — Encounter: Payer: Self-pay | Admitting: Orthopedic Surgery

## 2021-12-29 ENCOUNTER — Encounter (INDEPENDENT_AMBULATORY_CARE_PROVIDER_SITE_OTHER): Payer: Medicare HMO | Admitting: Orthopedic Surgery

## 2021-12-29 NOTE — Progress Notes (Signed)
Karen Mercer  Date of birth 10-03-1970  Today's date December 29, 2021  The patient had left knee surgery on August 29, 2012 she has 2 Synthe[employer] stainless steel screws in her left tibia  She also had a right total knee done by Dr. Mayer Camel.  Is a [employer] journey 2 cobalt-chromium total knee  She also had lumbar fusion by Dr. Joya Salm; she has a posterior lumbar interbody fusion with pedicle screws and rate cage which is most likely a cobalt chromium metal construct.  However, the patient will need to speak with Dr. Harley Hallmark office as to the type of metal that was used as I could not find it in the operative note.  Hopefully this will provide the information that was requested from our office  Sincerely,  Dr. Arther Abbott, Brooke Bonito.

## 2021-12-29 NOTE — Progress Notes (Unsigned)
Karen Mercer  Date of birth 1970/09/29  Today's date December 29, 2021  The patient had left knee surgery on August 29, 2012 she has 2 Synthes stainless steel screws in her left tibia  She also had a right total knee done by Dr. Mayer Camel.  Is a [employer] journey 2 cobalt-chromium total knee  She also had lumbar fusion by Dr. Joya Salm; she has a posterior lumbar interbody fusion with pedicle screws and rate cage which is most likely a cobalt chromium metal construct.  However, the patient will need to speak with Dr. Harley Hallmark office as to the type of metal that was used as I could not find it in the operative note.  Hopefully this will provide the information that was requested from our office  Sincerely,  Dr. Arther Abbott, Brooke Bonito.

## 2021-12-29 NOTE — Telephone Encounter (Signed)
I dint know how to get it on letterhead :(

## 2021-12-29 NOTE — Progress Notes (Signed)
Karen Mercer  Date of birth Dec 02, 1970  Today's date December 29, 2021  The patient had left knee surgery on August 29, 2012 she has 2 Synthes stainless steel screws in her left tibia  She also had a right total knee done by Dr. Mayer Camel.  Is a [employer] journey 2 cobalt-chromium total knee  She also had lumbar fusion by Dr. Joya Salm; she has a posterior lumbar interbody fusion with pedicle screws and rate cage which is most likely a cobalt chromium metal construct.  However, the patient will need to speak with Dr. Harley Hallmark office as to the type of metal that was used as I could not find it in the operative note.  Hopefully this will provide the information that was requested from our office  Sincerely,  Dr. Arther Abbott, Brooke Bonito.

## 2022-01-01 NOTE — Telephone Encounter (Signed)
L knee Dr Aline Brochure 08/29/12 2 screws  R knee total knee Dr Tamala Julian 02/26/20  Lumbar spine fusion cages/ screws Dr Joya Salm 06/11/14  These are the surgeries Dr Aline Brochure has been able to locate in her chart review  I prepared a letter.

## 2022-01-01 NOTE — Telephone Encounter (Signed)
Unable to reach called and mailbox is full Friday and today if she calls back letter is in pickup box at front desk

## 2022-01-31 ENCOUNTER — Inpatient Hospital Stay: Admission: RE | Admit: 2022-01-31 | Payer: Medicare HMO | Source: Ambulatory Visit

## 2022-02-14 ENCOUNTER — Ambulatory Visit
Admission: RE | Admit: 2022-02-14 | Discharge: 2022-02-14 | Disposition: A | Discharge status: Home / Self Care | Payer: Medicare Other | Source: Ambulatory Visit | Attending: Adult Medicine | Admitting: Adult Medicine

## 2022-02-14 ENCOUNTER — Ambulatory Visit
Admission: RE | Admit: 2022-02-14 | Discharge: 2022-02-14 | Disposition: A | Discharge status: Home / Self Care | Payer: Medicare HMO | Source: Ambulatory Visit | Attending: Adult Medicine | Admitting: Adult Medicine

## 2022-02-14 DIAGNOSIS — R921 Mammographic calcification found on diagnostic imaging of breast: Secondary | ICD-10-CM

## 2022-02-14 DIAGNOSIS — R928 Other abnormal and inconclusive findings on diagnostic imaging of breast: Secondary | ICD-10-CM

## 2022-02-21 ENCOUNTER — Other Ambulatory Visit: Payer: Self-pay | Admitting: Adult Medicine

## 2022-02-21 ENCOUNTER — Ambulatory Visit
Admission: RE | Admit: 2022-02-21 | Discharge: 2022-02-21 | Disposition: A | Discharge status: Home / Self Care | Payer: Medicare Other | Source: Ambulatory Visit | Attending: Adult Medicine | Admitting: Adult Medicine

## 2022-02-21 DIAGNOSIS — N6310 Unspecified lump in the right breast, unspecified quadrant: Secondary | ICD-10-CM

## 2022-02-21 DIAGNOSIS — N611 Abscess of the breast and nipple: Secondary | ICD-10-CM

## 2022-02-26 ENCOUNTER — Inpatient Hospital Stay: Admission: RE | Admit: 2022-02-26 | Payer: Medicare Other | Source: Ambulatory Visit

## 2022-02-26 LAB — AEROBIC/ANAEROBIC CULTURE W GRAM STAIN (SURGICAL/DEEP WOUND)
Culture: NO GROWTH
Gram Stain: NONE SEEN

## 2022-02-27 ENCOUNTER — Other Ambulatory Visit: Payer: Medicare Other

## 2022-04-04 ENCOUNTER — Other Ambulatory Visit: Payer: Medicare Other

## 2022-05-03 ENCOUNTER — Other Ambulatory Visit: Payer: Medicare Other

## 2022-07-05 ENCOUNTER — Encounter: Payer: Self-pay | Admitting: Radiology

## 2022-11-01 ENCOUNTER — Encounter: Payer: Self-pay | Admitting: Orthopedic Surgery

## 2022-11-01 ENCOUNTER — Other Ambulatory Visit (INDEPENDENT_AMBULATORY_CARE_PROVIDER_SITE_OTHER): Payer: 59

## 2022-11-01 ENCOUNTER — Ambulatory Visit (INDEPENDENT_AMBULATORY_CARE_PROVIDER_SITE_OTHER): Payer: 59 | Admitting: Orthopedic Surgery

## 2022-11-01 VITALS — BP 136/92 | HR 72 | Ht 62.0 in | Wt 145.0 lb

## 2022-11-01 DIAGNOSIS — G5611 Other lesions of median nerve, right upper limb: Secondary | ICD-10-CM

## 2022-11-01 DIAGNOSIS — M25521 Pain in right elbow: Secondary | ICD-10-CM | POA: Diagnosis not present

## 2022-11-01 DIAGNOSIS — R202 Paresthesia of skin: Secondary | ICD-10-CM

## 2022-11-01 DIAGNOSIS — G5621 Lesion of ulnar nerve, right upper limb: Secondary | ICD-10-CM | POA: Diagnosis not present

## 2022-11-01 MED ORDER — PREDNISONE 10 MG (48) PO TBPK: ORAL_TABLET | Freq: Every day | ORAL | 0 refills | Status: DC

## 2022-11-01 MED ORDER — GABAPENTIN 100 MG PO CAPS: 100.0000 mg | ORAL_CAPSULE | Freq: Three times a day (TID) | ORAL | 2 refills | Status: DC

## 2022-11-01 NOTE — Progress Notes (Signed)
Chief Complaint  Patient presents with   Elbow Pain    Right elbow pain down to fingers with numbness x1 month hard to bend elbow  feels like it needs to be popped per patient    History 52 year old female with history femoral shaft fracture many years ago she had flexible nails placed did well  Complains of right elbow pain after throwing a ball with her grandson  Complains of dull aching pain volar ulnar aspect of right forearm with intermittent tingling in her fingers  She also has lost some extension in the elbow  BP (!) 136/92   Pulse 72   Ht 5\' 2"  (1.575 m)   Wt 145 lb (65.8 kg)   BMI 26.52 kg/m   Again 5 degree loss of extension right elbow full flexion tenderness around the medial epicondyles and ulnar nerve with negative Tinel's normal grip strength normal opening of the fingers extension PIP joints Tenderness over the left forearm and later musculature  X-rays negative for acute fracture mild arthritis seen immediately which were flexible nails also noted in numerous  Differential diagnosis  Encounter Diagnoses  Name Primary?   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    Dispense:  90 capsule    Refill:  2   predniSONE (STERAPRED UNI-PAK 48 TAB) 10 MG (48) TBPK tablet    Sig: Take by mouth daily. 10 mg DS 12 days    Dispense:  48 tablet    Refill:  0

## 2022-11-12 ENCOUNTER — Telehealth: Payer: Self-pay | Admitting: Orthopedic Surgery

## 2022-11-12 NOTE — Telephone Encounter (Signed)
Dr. Mort Sawyers pt - pt lvm stating that our office was supposed to set up an appointment for her to have a NCS.  She would like a call back.

## 2022-11-21 ENCOUNTER — Ambulatory Visit (INDEPENDENT_AMBULATORY_CARE_PROVIDER_SITE_OTHER): Payer: 59 | Admitting: Physical Medicine and Rehabilitation

## 2022-11-21 DIAGNOSIS — R202 Paresthesia of skin: Secondary | ICD-10-CM | POA: Diagnosis not present

## 2022-11-21 DIAGNOSIS — M25521 Pain in right elbow: Secondary | ICD-10-CM

## 2022-11-21 DIAGNOSIS — M79601 Pain in right arm: Secondary | ICD-10-CM

## 2022-11-21 NOTE — Progress Notes (Signed)
Karen Mercer - 52 y.o. female MRN [id number]  Date of birth: [birth year]  Office Visit Note: Visit Date: 11/21/2022 PCP: Pearson Grippe, MD Referred by: [name], MD  Subjective: Chief Complaint  Patient presents with   Right Elbow - Pain   Right Hand - Pain, Numbness   Right Arm - Pain   HPI:  Karen Mercer is a 52 y.o. female who comes in today at the request of Dr. [name] for evaluation and management of chronic, worsening and severe pain, numbness and tingling in the Right upper extremities.  Patient is Right hand dominant.  Her biggest complaint today is loss of full extension of the right elbow status post throwing a ball with her grandson.  She has thrown the ball with him on many occasions without any issue.  She reports since this time where she woke up and really had less range of motion she has had occasional tingling in the hand but nondermatomal.  She has a lot of pain about the elbow at times and it is more in the medial epicondyles and ulnar side.  She denies any frank radicular symptoms.  No history of prior electrodiagnostic study or nerve problem.  No history of diabetes.  She does not feel like she is getting any hand weakness her biggest concern is the extension of the elbow.    I spent more than 30 minutes speaking face-to-face with the patient with 50% of the time in counseling and discussing coordination of care.     Review of Systems  Musculoskeletal:  Positive for joint pain.  Neurological:  Positive for tingling.  All other systems reviewed and are negative.  Otherwise per HPI.  Assessment & Plan: Visit Diagnoses:    ICD-10-CM   1. Paresthesia of skin  R20.2 NCV with EMG (electromyography)    2. Pain in right elbow  M25.521     3. Right arm pain  M79.601       Plan: Impression: Biggest complaint is decreased range of motion but lack of extension of the right elbow status post injury while throwing ball with her grandson.  Seems  to be more mechanical related to the elbow but she does get some vague paresthesias in the hand more global potentially more ulnar.  Electrodiagnostic study performed today.  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   There is no significant electrodiagnostic evidence of any other focal nerve entrapment, brachial plexopathy or cervical radiculopathy. **This electrodiagnostic study cannot rule out small fiber polyneuropathy and dysesthesias from central pain syndromes such as stroke or central pain sensitization syndromes such as fibromyalgia.  Myotomal referral pain from trigger points is also not excluded.  Recommendations: 1.  Follow-up with referring physician.  Careful clinical correlation with findings is paramount.  The mild to moderate median nerve neuropathy likely would not explain her biggest complaint of elbow decreased extension. 2.  Continue current management of symptoms.  Meds & Orders: No orders of the defined types were placed in this encounter.   Orders Placed This Encounter  Procedures   NCV with EMG (electromyography)    Follow-up: Return for [name], MD.   Procedures: No procedures performed  EMG & NCV Findings: Evaluation of the right median (across palm) sensory nerve showed no response (Palm) and prolonged distal peak latency (4.7 ms).  All remaining nerves (as  indicated in the following tables) were within normal limits.    All examined muscles (as indicated in the following table) showed no evidence of electrical instability.    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   There is no significant electrodiagnostic evidence of any other  focal nerve entrapment, brachial plexopathy or cervical radiculopathy. **This electrodiagnostic study cannot rule out small fiber polyneuropathy and dysesthesias from central pain syndromes such as stroke or central pain sensitization syndromes such as fibromyalgia.  Myotomal referral pain from trigger points is also not excluded.  Recommendations: 1.  Follow-up with referring physician.  Careful clinical correlation with findings is paramount.  The mild to moderate median nerve neuropathy likely would not explain her biggest complaint of elbow decreased extension. 2.  Continue current management of symptoms.  ___________________________ Naaman Plummer FAAPMR Board Certified, American Board of Physical Medicine and Rehabilitation    Nerve Conduction Studies Anti Sensory Summary Table   Stim Site NR Peak (ms) Norm Peak (ms) P-T Amp (V) Norm P-T Amp Site1 Site2 Delta-P (ms) Dist (cm) Vel (m/s) Norm Vel (m/s)  Right Median Acr Palm Anti Sensory (2nd Digit)  29.5C  Wrist    *4.7 <3.6 18.4 >10 Wrist Palm  0.0    Palm *NR  <2.0          Right Radial Anti Sensory (Base 1st Digit)  29C  Wrist    2.5 <3.1 20.8  Wrist Base 1st Digit 2.5 0.0    Right Ulnar Anti Sensory (5th Digit)  29.5C  Wrist    3.7 <3.7 15.7 >15.0 Wrist 5th Digit 3.7 14.0 38 >38   Motor Summary Table   Stim Site NR Onset (ms) Norm Onset (ms) O-P Amp (mV) Norm O-P Amp Site1 Site2 Delta-0 (ms) Dist (cm) Vel (m/s) Norm Vel (m/s)  Right Median Motor (Abd Poll Brev)  29.1C  Wrist    4.2 <4.2 7.6 >5 Elbow Wrist 4.3 21.5 50 >50  Elbow    8.5  7.5         Right Ulnar Motor (Abd Dig Min)  29.4C  Wrist    3.1 <4.2 10.2 >3 B Elbow Wrist 3.6 19.5 54 >53  B Elbow    6.7  11.2  A Elbow B Elbow 1.5 11.0 73 >53  A Elbow    8.2  11.0          EMG   Side Muscle Nerve Root Ins Act Fibs Psw Amp Dur Poly Recrt Int Dennie Bible Comment  Right Abd Poll Brev Median C8-T1 Nml Nml Nml Nml Nml 0 Nml Nml   Right 1stDorInt Ulnar C8-T1 Nml Nml Nml Nml  Nml 0 Nml Nml   Right PronatorTeres Median C6-7 Nml Nml Nml Nml Nml 0 Nml Nml   Right Biceps Musculocut C5-6 Nml Nml Nml Nml Nml 0 Nml Nml   Right Deltoid Axillary C5-6 Nml Nml Nml Nml Nml 0 Nml Nml     Nerve Conduction Studies Anti Sensory Left/Right Comparison   Stim Site L Lat (ms) R Lat (ms) L-R Lat (ms) L Amp (V) R Amp (V) L-R Amp (%) Site1 Site2 L Vel (m/s) R Vel (m/s) L-R Vel (m/s)  Median Acr Palm Anti Sensory (2nd Digit)  29.5C  Wrist  *4.7   18.4  Wrist [employer]  Radial Anti Sensory (Base 1st Digit)  29C  Wrist  2.5   20.8  Wrist Base 1st Digit     Ulnar Anti Sensory (5th Digit)  29.5C  Wrist  3.7   15.7  Wrist 5th Digit  38    Motor Left/Right Comparison   Stim Site L Lat (ms) R Lat (ms) L-R Lat (ms) L Amp (mV) R Amp (mV) L-R Amp (%) Site1 Site2 L Vel (m/s) R Vel (m/s) L-R Vel (m/s)  Median Motor (Abd Poll Brev)  29.1C  Wrist  4.2   7.6  Elbow Wrist  50   Elbow  8.5   7.5        Ulnar Motor (Abd Dig Min)  29.4C  Wrist  3.1   10.2  B Elbow Wrist  54   B Elbow  6.7   11.2  A Elbow B Elbow  73   A Elbow  8.2   11.0           Waveforms:             Clinical History: No specialty comments available.     Objective:  VS:  HT:    WT:   BMI:     BP:   HR: bpm  TEMP: ( )  RESP:  Physical Exam Vitals and nursing note reviewed.  Constitutional:      General: She is not in acute distress.    Appearance: Normal appearance. She is well-developed. She is not ill-appearing.  HENT:     Head: Normocephalic and atraumatic.  Eyes:     Conjunctiva/sclera: Conjunctivae normal.     Pupils: Pupils are equal, round, and reactive to light.  Cardiovascular:     Rate and Rhythm: Normal rate.     Pulses: Normal pulses.  Pulmonary:     Effort: Pulmonary effort is normal.  Musculoskeletal:        General: Tenderness present. No swelling or deformity.     Right lower leg: No edema.     Left lower leg: No edema.     Comments: Inspection reveals no  atrophy of the bilateral APB or FDI or hand intrinsics. There is no swelling, color changes, allodynia or dystrophic changes. There is 5 out of 5 strength in the bilateral wrist extension, finger abduction and long finger flexion. There is intact sensation to light touch in all dermatomal and peripheral nerve distributions. There is a negative Froment's test bilaterally. There is a negative Tinel's test at the bilateral wrist and elbow. There is a negative Phalen's test bilaterally. There is a negative Hoffmann's test bilaterally.  She does seem to lack some range of motion of the right elbow with full extension unachievable.  Skin:    General: Skin is warm and dry.     Findings: No erythema or rash.  Neurological:     General: No focal deficit present.     Mental Status: She is alert and oriented to person, place, and time.     Cranial Nerves: No cranial nerve deficit.     Sensory: No sensory deficit.     Motor: No weakness or abnormal muscle tone.     Coordination: Coordination normal.     Gait: Gait normal.  Psychiatric:        Mood and Affect: Mood normal.        Behavior: Behavior normal.      Imaging: No results found.

## 2022-11-29 NOTE — Procedures (Signed)
EMG & NCV Findings: Evaluation of the right median (across palm) sensory nerve showed no response (Palm) and prolonged distal peak latency (4.7 ms).  All remaining nerves (as indicated in the following tables) were within normal limits.    All examined muscles (as indicated in the following table) showed no evidence of electrical instability.    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   There is no significant electrodiagnostic evidence of any other focal nerve entrapment, brachial plexopathy or cervical radiculopathy. **This electrodiagnostic study cannot rule out small fiber polyneuropathy and dysesthesias from central pain syndromes such as stroke or central pain sensitization syndromes such as fibromyalgia.  Myotomal referral pain from trigger points is also not excluded.  Recommendations: 1.  Follow-up with referring physician.  Careful clinical correlation with findings is paramount.  The mild to moderate median nerve neuropathy likely would not explain her biggest complaint of elbow decreased extension. 2.  Continue current management of symptoms.  ___________________________ Naaman Plummer FAAPMR Board Certified, American Board of Physical Medicine and Rehabilitation    Nerve Conduction Studies Anti Sensory Summary Table   Stim Site NR Peak (ms) Norm Peak (ms) P-T Amp (V) Norm P-T Amp Site1 Site2 Delta-P (ms) Dist (cm) Vel (m/s) Norm Vel (m/s)  Right Median Acr Palm Anti Sensory (2nd Digit)  29.5C  Wrist    *4.7 <3.6 18.4 >10 Wrist Palm  0.0    Palm *NR  <2.0          Right Radial Anti Sensory (Base 1st Digit)  29C  Wrist    2.5 <3.1 20.8  Wrist Base 1st Digit 2.5 0.0    Right Ulnar Anti Sensory (5th Digit)  29.5C  Wrist    3.7 <3.7 15.7 >15.0 Wrist 5th Digit 3.7 14.0 38 >38   Motor Summary Table   Stim Site  NR Onset (ms) Norm Onset (ms) O-P Amp (mV) Norm O-P Amp Site1 Site2 Delta-0 (ms) Dist (cm) Vel (m/s) Norm Vel (m/s)  Right Median Motor (Abd Poll Brev)  29.1C  Wrist    4.2 <4.2 7.6 >5 Elbow Wrist 4.3 21.5 50 >50  Elbow    8.5  7.5         Right Ulnar Motor (Abd Dig Min)  29.4C  Wrist    3.1 <4.2 10.2 >3 B Elbow Wrist 3.6 19.5 54 >53  B Elbow    6.7  11.2  A Elbow B Elbow 1.5 11.0 73 >53  A Elbow    8.2  11.0          EMG   Side Muscle Nerve Root Ins Act Fibs Psw Amp Dur Poly Recrt Int Dennie Bible Comment  Right Abd Poll Brev Median C8-T1 Nml Nml Nml Nml Nml 0 Nml Nml   Right 1stDorInt Ulnar C8-T1 Nml Nml Nml Nml Nml 0 Nml Nml   Right PronatorTeres Median C6-7 Nml Nml Nml Nml Nml 0 Nml Nml   Right Biceps Musculocut C5-6 Nml Nml Nml Nml Nml 0 Nml Nml   Right Deltoid Axillary C5-6 Nml Nml Nml Nml Nml 0 Nml Nml     Nerve Conduction Studies Anti Sensory Left/Right Comparison   Stim Site L Lat (ms) R Lat (ms) L-R Lat (ms) L Amp (V) R Amp (V) L-R Amp (%) Site1 Site2 L Vel (m/s) R Vel (m/s) L-R Vel (m/s)  Median Acr Palm Anti  Sensory (2nd Digit)  29.5C  Wrist  *4.7   18.4  Wrist Palm     Palm             Radial Anti Sensory (Base 1st Digit)  29C  Wrist  2.5   20.8  Wrist Base 1st Digit     Ulnar Anti Sensory (5th Digit)  29.5C  Wrist  3.7   15.7  Wrist 5th Digit  38    Motor Left/Right Comparison   Stim Site L Lat (ms) R Lat (ms) L-R Lat (ms) L Amp (mV) R Amp (mV) L-R Amp (%) Site1 Site2 L Vel (m/s) R Vel (m/s) L-R Vel (m/s)  Median Motor (Abd Poll Brev)  29.1C  Wrist  4.2   7.6  Elbow Wrist  50   Elbow  8.5   7.5        Ulnar Motor (Abd Dig Min)  29.4C  Wrist  3.1   10.2  B Elbow Wrist  54   B Elbow  6.7   11.2  A Elbow B Elbow  73   A Elbow  8.2   11.0           Waveforms:

## 2022-11-30 ENCOUNTER — Encounter: Payer: Self-pay | Admitting: Physical Medicine and Rehabilitation

## 2022-12-06 ENCOUNTER — Ambulatory Visit (INDEPENDENT_AMBULATORY_CARE_PROVIDER_SITE_OTHER): Payer: 59 | Admitting: Orthopedic Surgery

## 2022-12-06 DIAGNOSIS — G5631 Lesion of radial nerve, right upper limb: Secondary | ICD-10-CM

## 2022-12-06 MED ORDER — METHYLPREDNISOLONE ACETATE 40 MG/ML IJ SUSP
40.0000 mg | Freq: Once | INTRAMUSCULAR | Status: AC
  Administered 2022-12-06: 40 mg via INTRA_ARTICULAR

## 2022-12-06 NOTE — Addendum Note (Signed)
Addended byCaffie Damme on: 12/06/2022 04:59 PM   Modules accepted: Orders

## 2022-12-06 NOTE — Progress Notes (Signed)
Chief Complaint  Patient presents with   Results    Review nerve study right elbow pain     6.27.24:     Chief Complaint  Patient presents with   Elbow Pain      Right elbow pain down to fingers with numbness x1 month hard to bend elbow  feels like it needs to be popped per patient      History 52 year old female with history femoral shaft fracture many years ago she had flexible nails placed did well   Complains of right elbow pain after throwing a ball with her grandson   Complains of dull aching pain volar ulnar aspect of right forearm with intermittent tingling in her fingers   She also has lost some extension in the elbow  Sent for emg/ncs  Impression: The above electrodiagnostic study is ABNORMAL and reveals evidence of a mild to moderate 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    There is no significant electrodiagnostic evidence of any other focal nerve entrapment, brachial plexopathy or cervical radiculopathy. **This electrodiagnostic study cannot rule out small fiber polyneuropathy and dysesthesias from central pain syndromes such as stroke or central pain sensitization syndromes such as fibromyalgia.  Myotomal referral pain from trigger points is also not excluded.   Recommendations: 1.  Follow-up with referring physician.  Careful clinical correlation with findings is paramount.  The mild to moderate median nerve neuropathy likely would not explain her biggest complaint of elbow decreased extension. 2.  Continue current management of symptoms.   ___________________________ Elease Hashimoto Board Certified, American Board of Physical Medicine and Rehabilitation  Today :   Reevaluation.  The patient's symptoms include inability to fully extend the elbow dull aching throbbing pain in the antecubital fossa towards the radial side of the forearm  Examination shows tenderness distal to the area of  tenderness for tennis elbow suggesting perhaps radial tunnel syndrome.  Pain with wrist extension pain with long finger resistance in extension  Loss of extension  Assessment and plan  Possible radial tunnel syndrome Encounter Diagnosis  Name Primary?   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  We cleaned with alcohol. No complications were noted  Wrist extension block forearm brace  Return in 4 weeks

## 2023-07-08 ENCOUNTER — Encounter: Payer: Self-pay | Admitting: Orthopedic Surgery

## 2023-07-08 ENCOUNTER — Ambulatory Visit (INDEPENDENT_AMBULATORY_CARE_PROVIDER_SITE_OTHER): Payer: Medicaid Other | Admitting: Orthopedic Surgery

## 2023-07-08 ENCOUNTER — Other Ambulatory Visit: Payer: Self-pay

## 2023-07-08 VITALS — BP 180/130 | HR 74 | Ht 62.0 in | Wt 146.0 lb

## 2023-07-08 DIAGNOSIS — M5416 Radiculopathy, lumbar region: Secondary | ICD-10-CM | POA: Diagnosis not present

## 2023-07-08 DIAGNOSIS — Z981 Arthrodesis status: Secondary | ICD-10-CM | POA: Diagnosis not present

## 2023-07-08 DIAGNOSIS — G5611 Other lesions of median nerve, right upper limb: Secondary | ICD-10-CM | POA: Diagnosis not present

## 2023-07-08 DIAGNOSIS — M5410 Radiculopathy, site unspecified: Secondary | ICD-10-CM

## 2023-07-08 DIAGNOSIS — G5621 Lesion of ulnar nerve, right upper limb: Secondary | ICD-10-CM

## 2023-07-08 MED ORDER — PREDNISONE 10 MG (48) PO TBPK: ORAL_TABLET | Freq: Every day | ORAL | 0 refills | Status: DC

## 2023-07-08 MED ORDER — GABAPENTIN 300 MG PO CAPS: 300.0000 mg | ORAL_CAPSULE | Freq: Three times a day (TID) | ORAL | 5 refills | Status: AC

## 2023-07-08 NOTE — Addendum Note (Signed)
 Addended byCaffie Damme on: 07/08/2023 11:06 AM   Modules accepted: Orders

## 2023-07-08 NOTE — Progress Notes (Signed)
  Intake history:  BP (!) 180/130 Comment: patient aware of elevated BP  Pulse 74   Ht 5\' 2"  (1.575 m)   Wt 146 lb (66.2 kg)   BMI 26.70 kg/m  Body mass index is 26.7 kg/m.    WHAT ARE WE SEEING YOU FOR TODAY?   back - right leg   Radiation?: yes - right leg .   Loss of bowel/urine control?  no  How long has this bothered you? (DOI?DOS?WS?)  Previous back surgery Dr Franky Macho  Dr Jeral Fruit / fusion   Anticoag.  No  Diabetes No  Heart disease No  Hypertension Yes  SMOKING HX Yes  Kidney disease No  Any ALLERGIES __________________ Allergies  Allergen Reactions   Tramadol Itching and Nausea Only   ____________________________   Treatment:  Have you taken:  Tylenol No  Advil No  Had PT No  Had injection No  Other  ________________________

## 2023-07-08 NOTE — Progress Notes (Signed)
  Subjective:     Patient ID: Karen Mercer, female   DOB: 1970/08/01, 53 y.o.   MRN: [id number]  Chief Complaint  Patient presents with   Leg Pain    Right/ all the way down from lateral hip down entire leg     53 year old female staff is post lumbar fusion presents with right lateral leg pain and some associated lower back pain       Review of Systems  Neurological:  Negative for weakness and numbness.       Objective:   Physical Exam Vitals and nursing note reviewed.  Constitutional:      Appearance: Normal appearance.  HENT:     Head: Normocephalic and atraumatic.  Eyes:     General: No scleral icterus.       Right eye: No discharge.        Left eye: No discharge.     Extraocular Movements: Extraocular movements intact.     Conjunctiva/sclera: Conjunctivae normal.     Pupils: Pupils are equal, round, and reactive to light.  Cardiovascular:     Rate and Rhythm: Normal rate.     Pulses: Normal pulses.  Musculoskeletal:     Comments: Tenderness central lower lumbar right lower lumbar right buttock  Lateral right leg also tender  Straight leg raises negative right and left leg  L4 and L5 motor strength S1 normal  Sensory change L5 lateral lower leg  Reflexes poor L4 reflex otherwise normal ankle and left knee    Skin:    General: Skin is warm and dry.     Capillary Refill: Capillary refill takes less than 2 seconds.  Neurological:     General: No focal deficit present.     Mental Status: She is alert and oriented to person, place, and time.     Sensory: Sensory deficit present.     Motor: No weakness.     Gait: Gait abnormal.     Deep Tendon Reflexes: Reflexes abnormal.  Psychiatric:        Mood and Affect: Mood normal.        Behavior: Behavior normal.        Thought Content: Thought content normal.        Judgment: Judgment normal.        Assessment:     Encounter Diagnoses  Name Primary?   Radiculopathy, lumbar region Yes   S/P lumbar  fusion    Radicular pain of right lower extremity    Cubital tunnel syndrome on right    Pronator syndrome, right        Plan:     Recommend medication if no improvement recommend follow-up with neurosurgery  Meds ordered this encounter  Medications   gabapentin (NEURONTIN) 300 MG capsule    Sig: Take 1 capsule (300 mg total) by mouth 3 (three) times daily.    Dispense:  90 capsule    Refill:  5   predniSONE (STERAPRED UNI-PAK 48 TAB) 10 MG (48) TBPK tablet    Sig: Take by mouth daily. 10 mg DS 12 days    Dispense:  48 tablet    Refill:  0

## 2023-07-08 NOTE — Patient Instructions (Signed)
 [facility] on [address] in [place]  If meds dont help call for appointmen

## 2023-07-31 ENCOUNTER — Telehealth: Payer: Self-pay | Admitting: Orthopedic Surgery

## 2023-07-31 NOTE — Telephone Encounter (Signed)
 Dr. Mort Sawyers pt - spoke w/the pt, she is wanting a new medical id card that shows she has metal in her knees for [facility].  She stated that our office has that here and has done it for her before.  [phone]

## 2024-01-20 ENCOUNTER — Other Ambulatory Visit: Payer: Self-pay

## 2024-01-20 ENCOUNTER — Encounter (HOSPITAL_COMMUNITY): Payer: Self-pay | Admitting: Occupational Therapy

## 2024-01-20 ENCOUNTER — Ambulatory Visit (HOSPITAL_COMMUNITY): Attending: Adult Health | Admitting: Occupational Therapy

## 2024-01-20 DIAGNOSIS — M25561 Pain in right knee: Secondary | ICD-10-CM | POA: Insufficient documentation

## 2024-01-20 DIAGNOSIS — M25562 Pain in left knee: Secondary | ICD-10-CM | POA: Insufficient documentation

## 2024-01-20 DIAGNOSIS — R29898 Other symptoms and signs involving the musculoskeletal system: Secondary | ICD-10-CM | POA: Insufficient documentation

## 2024-01-20 DIAGNOSIS — G8929 Other chronic pain: Secondary | ICD-10-CM | POA: Insufficient documentation

## 2024-01-20 NOTE — Therapy (Signed)
 OUTPATIENT PHYSICAL THERAPY WHEELCHAIR EVALUATION   Patient Name: Karen Mercer MRN: [id number] DOB:February 05, 1971, 53 y.o., female Today's Date: 01/20/2024  END OF SESSION:    01/20/24 1337  OT Visits / Re-Eval  Visit Number 1  Number of Visits 1  Date for OT Re-Evaluation 01/21/24  Authorization  Authorization Type 1) [employer] Medicare Dual Complete 2) [place] Medicaid  Progress Note Due on Visit 10  OT Time Calculation  OT Start Time 1300  OT Stop Time 1320  OT Time Calculation (min) 20 min  End of Session  Activity Tolerance Patient tolerated treatment well  Behavior During Therapy WFL for tasks assessed/performed    Past Medical History:  Diagnosis Date   Chronic back pain    both legs;DDD and stenosis    Complication of anesthesia    had extreme headache after last back surgery - was in pacu for extended period   Fibroid 02/08/2021   5.9 cm subserosal fibroid in US      Hypertension    Muscle spasm    takes Valium  daily as needed   Neuromuscular disorder (HCC)    PONV (postoperative nausea and vomiting)    Rheumatoid arthritis (HCC)    Past Surgical History:  Procedure Laterality Date   BACK SURGERY     at surgical center   CHONDROPLASTY Right 07/23/2019   Procedure: CHONDROPLASTY RIGHT PATELLA  AND LATERAL FEMUR;  Surgeon: Margrette Taft BRAVO, MD;  Location: [facility];  Service: Orthopedics;  Laterality: Right;   KNEE ARTHROSCOPY  08/17/2011   Procedure: ARTHROSCOPY KNEE;  Surgeon: Taft BRAVO Margrette, MD;  Location: [facility];  Service: Orthopedics;  Laterality: Right;  with tibia tubercle transfer and proximal realignment   KNEE ARTHROSCOPY Right 07/23/2019   Procedure: ARTHROSCOPY RIGHT KNEE;  Surgeon: Margrette Taft BRAVO, MD;  Location: [facility];  Service: Orthopedics;  Laterality: Right;   KNEE ARTHROSCOPY WITH FULKERSON SLIDE Left 08/29/2012   Procedure: Arthroscopic lateral release with chondroplasty left knee;  Surgeon: Taft BRAVO Margrette, MD;  Location: [facility];  Service:  Orthopedics;  Laterality: Left;   KNEE ARTHROSCOPY WITH MEDIAL PATELLAR FEMORAL LIGAMENT RECONSTRUCTION Left 08/29/2012   Procedure: Open proximal realignment fulkerson;  Surgeon: Taft BRAVO Margrette, MD;  Location: [facility];  Service: Orthopedics;  Laterality: Left;   KNEE SURGERY  2012   right knee-arthroscopy   LUMBAR LAMINECTOMY/DECOMPRESSION MICRODISCECTOMY Right 06/11/2014   Procedure: Right L4-5 Lumbar diskectomy;  Surgeon: Catalina CHRISTELLA Stains, MD;  Location: [facility];  Service: Neurosurgery;  Laterality: Right;  Right L4-5 Lumbar diskectomy   right arm  2008   rods from fractured arm   TUBAL LIGATION  1995   Patient Active Problem List   Diagnosis Date Noted   Panic disorder 06/27/2021   Vitamin D  deficiency 06/27/2021   Fibroid 02/08/2021   RLQ abdominal pain 01/24/2021   Hypertensive urgency 02/11/2020   S/P right knee arthroscopy for chondroplasty patella 07/23/19  07/30/2019   Microcytic anemia 07/09/2019   Anxiety disorder 07/23/2018   Atopic dermatitis 07/23/2018   Elevated blood-pressure reading without diagnosis of hypertension 07/23/2018   Lumbar degenerative disc disease 09/22/2014   Lumbar herniated disc 06/11/2014   Chronic pain 05/14/2013   HNP (herniated nucleus pulposus), lumbar 05/14/2013   Leg weakness, bilateral 04/21/2013   Radicular pain of left lower extremity 03/31/2013   Retrolisthesis 03/31/2013   Effusion of knee joint 10/21/2012   Status post knee surgery 10/21/2012   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   Patellofemoral instability with pain 05/22/2012   Dislocation of patella 01/16/2012   Knee pain, bilateral 01/16/2012   Difficulty walking 10/31/2011   Pain in right knee 10/31/2011   Weakness of right leg 10/31/2011   Knee pain 09/11/2011   Stiffness of joint, not elsewhere classified, lower leg 09/11/2011   Gait abnormality 09/11/2011   Muscle weakness  (generalized) 09/11/2011   Knee instability 07/12/2011   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 Lynwood Cramp  REFERRING PROVIDER: Rogue Raina Petite, NP  REFERRING DIAG: electric wheelchair evaluation  THERAPY DIAG:  Chronic pain of left knee  Chronic pain of right knee  Other symptoms and signs involving the musculoskeletal system  ONSET DATE: chronic  Rationale for Evaluation and Treatment: Rehabilitation  SUBJECTIVE:                                                                                                                                                                                           SUBJECTIVE STATEMENT: S: I have pain all the time.  PERTINENT HISTORY:  Pt is a 53 y/o female presenting with bilateral knee pain impacting her ability to perform functional mobility tasks. Hx of multiple bilateral knee surgeries.   PAIN:  Are you having pain? Yes: NPRS scale: 8/10 Pain location: bilateral knees Pain description: aching Aggravating factors: weather, walking Relieving factors: ibuprofen , ice  PRECAUTIONS: None  WEIGHT BEARING RESTRICTIONS: No  FALLS:  Has patient fallen in last 6 months? No  PLOF: Independent  PATIENT GOALS: To have less pain with mobility    HOME ENVIRONMENT: [] House  [] Condo/town home  [] Apartment  [] Assisted Living    [] Lives Alone []  Lives with Others                                                    Hours with caregiver:   [] Home is accessible to patient            Stairs  [] Yes []  No     Ramp [] Yes [] No Comments:     COMMUNITY ADL: TRANSPORTATION: [x] Car    [] Emergency planning/management officer    [] Adapted w/c Lift   [] Ambulance   [] Other:       [] Sits in wheelchair during transport  Employment/School:     Specific requirements pertaining to mobility                                                     Other:                                       FUNCTIONAL/SENSORY PROCESSING SKILLS:  Handedness:   [x] Right     [] Left    [] NA  Comments:                                 Functional Processing Skills for Wheeled Mobility [x] Processing Skills are adequate for safe wheelchair operation  Areas of concern than may interfere with safe operation of wheelchair Description of problem   []  Attention to environment     [] Judgment     []  Hearing  []  Vision or visual processing    [] Motor Planning  []  Fluctuations in Behavior                                                   VERBAL COMMUNICATION: [x] WFL receptive [x]  WFL expressive [] Understandable  [] Difficult to understand  [] non-communicative []  Uses an augmented communication device    CURRENT SEATING / MOBILITY: Current Mobility Base:   [x] None  [] Dependent  [] Manual  [] Scooter  [] Power   Type of Control:                       Manufacturer:                         Size:                         Age:                           Current Condition of Mobility Base:                                                                                                                     Current Wheelchair components:  Describe posture in present seating system:                                                                            SENSATION and SKIN ISSUES: Sensation [x] Intact [] Impaired [] Absent   Level of sensation:                           Pressure Relief: Able to perform effective pressure relief :   [x] Yes  []  No Method:                                                                              If not, Why?:                                                                           Skin Issues/Skin Integrity Current Skin Issues   [] Yes [x] No  [] Intact []  Red area []  Open Area  [] Scar Tissue [] At risk from prolonged sitting  Where                              History of Skin Issues   [] Yes [x] No  Where                                         When                                               Hx of skin flap surgeries [] Yes [x] No  Where                  When                                                  Limited sitting tolerance [] Yes [] No Hours spent sitting in wheelchair daily:                                                         Complaint of Pain:  Please describe:  Swelling/Edema:                                                                                                                                               ADL STATUS (in reference to wheelchair use):  Indep Assist Unable Indep with Equip Not assessed Comments  Dressing       x                                                                  Eating        x                                                                                                                      Toileting        x                                                                                                                       Bathing        x  Grooming/ Hygiene        x                                                                                                                      Meal Prep      x                                                                                                                   IADLS     x                                                                                                            Bowel Management: [x] Continent  [] Incontinent   [] Accidents Comments:                                                  Bladder Management: [x] Continent  [] Incontinent  [] Accidents Comments:                                               STRENGTH/RANGE OF MOTION:  Range of Motion Strength  Shoulder WNL                                    5/5                                                          Elbow WNL  5/5                                                                Wrist/Hand WNL                                                             5/5                                                                         Hip WNL                                                             4/5                                                               Knee WNL                                                           4/5                                                                  Ankle WNL 4+/5                                                                    MOBILITY/BALANCE:  []  Patient is totally dependent for mobility  Balance Transfers Ambulation  Sitting Balance: Standing Balance: [x]  Independent [x]  Independent/Modified Independent  [x]  WFL     [x]  WFL []  Supervision []  Supervision  []  Uses UE for balance  []  Supervision []  Min Assist []  Ambulates with Assist                           []  Min Assist []  Min assist []  Mod Assist []  Ambulates with Device:  []  RW   []  StW   []  Cane   []                 []  Mod Assist []  Mod assist []  Max assist   []  Max Assist []  Max assist []  Dependent []  Indep. Short Distance Only  []  Unable []  Unable []  Lift / Sling Required Distance (in feet)                             []  Sliding board []  Unable to Ambulate: (Explain:  Cardio Status:  [x] Intact  []  Impaired   []  NA                              Respiratory Status:  [x] Intact   [] Impaired   [] NA                                      Orthotics/Prosthetics:                               None                                          Comments (Address manual vs power w/c vs scooter):                                             2-minute walk test: Pt completed 330 feet independently without rest break during 2' walk test    ASSESSMENT:  CLINICAL IMPRESSION: Patient is a 54 y.o. female who was seen today for occupational therapy evaluation for a power wheelchair. Pt demonstrates independence in mobility and balance, BLE strength is [facility] though painful. Discussed qualifications for a power wheelchair with pt, as pt is completing all ADLs and mobility independently and does not meet minimum requirements at this time. Provided information on local resource, [employer], as well as placed PT referral request for bilateral knee pain. Pt verbalized understanding of education and that she does not meet the minimum requirements for a power chair or scooter, agreeable to trial PT.   OBJECTIVE IMPAIRMENTS: decreased mobility, difficulty walking, and pain.   ACTIVITY LIMITATIONS: walking  PARTICIPATION LIMITATIONS: shopping and community activity  PERSONAL FACTORS: Time since onset of injury/illness/exacerbation are also affecting patient's functional outcome.   REHAB POTENTIAL: Fair hx of multiple sx  CLINICAL DECISION MAKING: Stable/uncomplicated  EVALUATION COMPLEXITY: Low  GOALS: [employer] date: 01/20/24    Pt will be educated on and provided with resources for local DME purchasing.  Goal status: MET   PLAN:  OT FREQUENCY: one time visit  OT DURATION: 1 week  PLANNED INTERVENTIONS: 02831- OT Re-Evaluation, [id number]- Self Care, and Patient/Family education.  PLAN FOR NEXT SESSION: N/A-no OT follow up   Sonny Cory, OTR/L  [phone] 01/20/2024, 1:38 PM

## 2024-02-13 ENCOUNTER — Encounter: Payer: Self-pay | Admitting: Orthopedic Surgery

## 2024-02-13 ENCOUNTER — Telehealth: Payer: Self-pay | Admitting: Orthopedic Surgery

## 2024-02-13 ENCOUNTER — Ambulatory Visit: Admitting: Orthopedic Surgery

## 2024-02-13 DIAGNOSIS — M21862 Other specified acquired deformities of left lower leg: Secondary | ICD-10-CM

## 2024-02-13 DIAGNOSIS — M1712 Unilateral primary osteoarthritis, left knee: Secondary | ICD-10-CM | POA: Diagnosis not present

## 2024-02-13 MED ORDER — METHYLPREDNISOLONE ACETATE 40 MG/ML IJ SUSP
40.0000 mg | Freq: Once | INTRAMUSCULAR | Status: AC
  Administered 2024-02-13: 40 mg via INTRA_ARTICULAR

## 2024-02-13 NOTE — Progress Notes (Signed)
    02/13/2024   Chief Complaint  Patient presents with   Knee Pain    Left/ wants injection  states working hard trying not to hyperextend knees when she is standing     No diagnosis found.  What pharmacy do you use ? ___________[facility] ________________  DOI/DOS/ Date:    Unchanged  CUSTOM KNEE BRACE patient has hyperextension with functional testing, needs custom brace to prevent hyperextension

## 2024-02-13 NOTE — Telephone Encounter (Signed)
 Aleck emailed me back she will get her taken care of

## 2024-02-13 NOTE — Telephone Encounter (Signed)
 Emailed caroline

## 2024-02-13 NOTE — Progress Notes (Addendum)
    02/13/2024   Chief Complaint  Patient presents with   Knee Pain    Left/ wants injection  states working hard trying not to hyperextend knees when she is standing     Encounter Diagnoses  Name Primary?   Unilateral primary osteoarthritis, left knee Yes   Hyperextension deformity of knee, left     What pharmacy do you use ? ___________[facility] ________________  53 year old female status post Fulkerson procedure many years ago comes in with posterior knee pain and excessive hyperextension of the left and right knee  Examination reveals posterior laxity most likely from posterior capsular stretching over years of hyperextension  Addendum: Specifically posterior drawer test reveals posterior laxity with a positive external rotation recurvatum test as well  ACL is intact  Collateral ligaments normal  No joint effusion.  Range of motion was normal  Incisions over the front of the knee are from the Fulkerson procedure look normal  She also wanted an injection in the left knee which we put in.  Recommend that she get a custom brace to stop the hyperextension  Procedure note left knee injection   verbal consent was obtained to inject left knee joint  Timeout was completed to confirm the site of injection  The medications used were depomedrol 40 mg and 1% lidocaine  3 cc Anesthesia was provided by ethyl chloride and the skin was prepped with alcohol.  After cleaning the skin with alcohol a 20-gauge needle was used to inject the left knee joint. There were no complications. A sterile bandage was applied.   CUSTOM KNEE BRACE patient has hyperextension with functional testing, needs custom brace to prevent hyperextension

## 2024-02-13 NOTE — Telephone Encounter (Signed)
 Call [facility] about brace that prevents hyperextension

## 2024-03-02 ENCOUNTER — Telehealth: Payer: Self-pay | Admitting: Orthopedic Surgery

## 2024-03-02 NOTE — Telephone Encounter (Signed)
 CUSTOM KNEE BRACE patient has Instability with functional testing, Quad to calf ratio mismatch prevents off the shelf bracing   Need this in her note from last visit to cover brace   Can you addend ?

## 2024-05-29 ENCOUNTER — Ambulatory Visit: Admitting: Orthopedic Surgery

## 2024-05-29 ENCOUNTER — Encounter: Payer: Self-pay | Admitting: Orthopedic Surgery

## 2024-05-29 DIAGNOSIS — Z981 Arthrodesis status: Secondary | ICD-10-CM | POA: Diagnosis not present

## 2024-05-29 DIAGNOSIS — M1712 Unilateral primary osteoarthritis, left knee: Secondary | ICD-10-CM | POA: Diagnosis not present

## 2024-05-29 DIAGNOSIS — G5621 Lesion of ulnar nerve, right upper limb: Secondary | ICD-10-CM

## 2024-05-29 DIAGNOSIS — M5416 Radiculopathy, lumbar region: Secondary | ICD-10-CM

## 2024-05-29 DIAGNOSIS — M21862 Other specified acquired deformities of left lower leg: Secondary | ICD-10-CM

## 2024-05-29 MED ORDER — METHYLPREDNISOLONE ACETATE 40 MG/ML IJ SUSP
40.0000 mg | Freq: Once | INTRAMUSCULAR | Status: AC
  Administered 2024-05-29: 40 mg via INTRAMUSCULAR

## 2024-05-29 MED ORDER — PREDNISONE 10 MG (48) PO TBPK: ORAL_TABLET | Freq: Every day | ORAL | 0 refills | Status: AC

## 2024-05-29 MED ORDER — METHYLPREDNISOLONE ACETATE 40 MG/ML IJ SUSP: 40.0000 mg | Freq: Once | INTRAMUSCULAR | Status: AC

## 2024-05-29 NOTE — Progress Notes (Signed)
" ° °  Patient: Karen Mercer           Date of Birth: September 02, 1970           MRN: [id number] Visit Date: 05/29/2024 Requested by: No referring provider defined for this encounter. PCP: Luke Agent, MD (Inactive)  Encounter Diagnoses  Name Primary?   Unilateral primary osteoarthritis, left knee Yes   Hyperextension deformity of knee, left    Radiculopathy, lumbar region    S/P lumbar fusion    Cubital tunnel syndrome on right     Assessment and plan:  54 year old female with radicular pain left leg and knee  Recommend pain management  I am Depo-Medrol  injection  Sterapred Dosepak    Procedure note for injection   Chief Complaint  Patient presents with   Injections    Left knee     Encounter Diagnoses  Name Primary?   Unilateral primary osteoarthritis, left knee Yes   Hyperextension deformity of knee, left    Radiculopathy, lumbar region    S/P lumbar fusion    Cubital tunnel syndrome on right         The patient has consented for injection of the left  Joint: Buttock IM injection  Medication: Depo-Medrol  40 mg and lidocaine  1%  Time out completed: Yes  The site of injection was cleaned with alcohol and ethyl chloride.  The injection was given without any complications appropriate precautions were given.   Meds ordered this encounter  Medications   methylPREDNISolone  acetate (DEPO-MEDROL ) injection 40 mg   methylPREDNISolone  acetate (DEPO-MEDROL ) injection 40 mg   predniSONE  (STERAPRED UNI-PAK 48 TAB) 10 MG (48) TBPK tablet    Sig: Take by mouth daily. 10 mg DS 12 days    Dispense:  48 tablet    Refill:  0     Chief Complaint  Patient presents with   Injections    Left knee    History:  The patient has a history of lumbar disc disease she had a Fulkerson procedure several years ago.  She has had chronic pain chronic pain in her left leg.  On this occasion she was standing at the sink started having some pain on the posterolateral aspect of her  left knee and presents for evaluation  Focused exam findings:  No effusion in the left knee no pain around the patella no subluxation dislocation anterior posterior drawer test normal  Tenderness along the iliotibial band lateral thigh lateral hip lower back and buttock  No results found.   "

## 2024-05-29 NOTE — Patient Instructions (Signed)
 [facility] [address]  [phone]  We will send referral there, you call next week to schedule, they will call you too.

## 2024-05-30 ENCOUNTER — Encounter: Payer: Self-pay | Admitting: Adult Health
